# Patient Record
Sex: Male | Born: 1955 | Race: White | Hispanic: No | Marital: Married | Smoking: Current every day smoker
Health system: Southern US, Community
[De-identification: ages and names within clinical notes are randomized; demographics above are authoritative.]

## PROBLEM LIST (undated history)

## (undated) DIAGNOSIS — M1990 Unspecified osteoarthritis, unspecified site: Secondary | ICD-10-CM

## (undated) DIAGNOSIS — M4800 Spinal stenosis, site unspecified: Secondary | ICD-10-CM

## (undated) DIAGNOSIS — K759 Inflammatory liver disease, unspecified: Secondary | ICD-10-CM

## (undated) DIAGNOSIS — I639 Cerebral infarction, unspecified: Secondary | ICD-10-CM

## (undated) DIAGNOSIS — I10 Essential (primary) hypertension: Secondary | ICD-10-CM

## (undated) DIAGNOSIS — C229 Malignant neoplasm of liver, not specified as primary or secondary: Secondary | ICD-10-CM

## (undated) HISTORY — PX: INGUINAL HERNIA REPAIR: SUR1180

## (undated) HISTORY — PX: POSTERIOR CERVICAL VERTEBRAE EXCISION: SUR544

## (undated) HISTORY — DX: Cerebral infarction, unspecified: I63.9

---

## 2015-01-18 ENCOUNTER — Other Ambulatory Visit: Payer: Self-pay | Admitting: Gastroenterology

## 2015-01-18 DIAGNOSIS — R1084 Generalized abdominal pain: Secondary | ICD-10-CM

## 2015-01-23 ENCOUNTER — Ambulatory Visit
Admission: RE | Admit: 2015-01-23 | Discharge: 2015-01-23 | Disposition: A | Discharge status: Home / Self Care | Payer: BLUE CROSS/BLUE SHIELD | Source: Ambulatory Visit | Attending: Gastroenterology | Admitting: Gastroenterology

## 2015-01-23 DIAGNOSIS — I700 Atherosclerosis of aorta: Secondary | ICD-10-CM | POA: Diagnosis not present

## 2015-01-23 DIAGNOSIS — R1084 Generalized abdominal pain: Secondary | ICD-10-CM

## 2015-01-23 DIAGNOSIS — B182 Chronic viral hepatitis C: Secondary | ICD-10-CM | POA: Insufficient documentation

## 2015-02-20 ENCOUNTER — Encounter: Payer: Self-pay | Admitting: *Deleted

## 2015-02-21 ENCOUNTER — Ambulatory Visit: Payer: BLUE CROSS/BLUE SHIELD | Admitting: Anesthesiology

## 2015-02-21 ENCOUNTER — Ambulatory Visit
Admission: RE | Admit: 2015-02-21 | Discharge: 2015-02-21 | Disposition: A | Discharge status: Home / Self Care | Payer: BLUE CROSS/BLUE SHIELD | Source: Ambulatory Visit | Attending: Gastroenterology | Admitting: Gastroenterology

## 2015-02-21 ENCOUNTER — Encounter: Payer: Self-pay | Admitting: *Deleted

## 2015-02-21 ENCOUNTER — Encounter
Admission: RE | Disposition: A | Discharge status: Home / Self Care | Payer: Self-pay | Source: Ambulatory Visit | Attending: Gastroenterology

## 2015-02-21 DIAGNOSIS — Z87891 Personal history of nicotine dependence: Secondary | ICD-10-CM | POA: Insufficient documentation

## 2015-02-21 DIAGNOSIS — M1990 Unspecified osteoarthritis, unspecified site: Secondary | ICD-10-CM | POA: Insufficient documentation

## 2015-02-21 DIAGNOSIS — B182 Chronic viral hepatitis C: Secondary | ICD-10-CM | POA: Insufficient documentation

## 2015-02-21 DIAGNOSIS — Z1211 Encounter for screening for malignant neoplasm of colon: Secondary | ICD-10-CM | POA: Diagnosis present

## 2015-02-21 DIAGNOSIS — M4800 Spinal stenosis, site unspecified: Secondary | ICD-10-CM | POA: Diagnosis not present

## 2015-02-21 DIAGNOSIS — K5730 Diverticulosis of large intestine without perforation or abscess without bleeding: Secondary | ICD-10-CM | POA: Insufficient documentation

## 2015-02-21 DIAGNOSIS — K219 Gastro-esophageal reflux disease without esophagitis: Secondary | ICD-10-CM | POA: Insufficient documentation

## 2015-02-21 DIAGNOSIS — I10 Essential (primary) hypertension: Secondary | ICD-10-CM | POA: Insufficient documentation

## 2015-02-21 DIAGNOSIS — Z79899 Other long term (current) drug therapy: Secondary | ICD-10-CM | POA: Insufficient documentation

## 2015-02-21 DIAGNOSIS — K648 Other hemorrhoids: Secondary | ICD-10-CM | POA: Insufficient documentation

## 2015-02-21 HISTORY — DX: Essential (primary) hypertension: I10

## 2015-02-21 HISTORY — DX: Inflammatory liver disease, unspecified: K75.9

## 2015-02-21 HISTORY — PX: COLONOSCOPY WITH PROPOFOL: SHX5780

## 2015-02-21 HISTORY — DX: Spinal stenosis, site unspecified: M48.00

## 2015-02-21 HISTORY — DX: Unspecified osteoarthritis, unspecified site: M19.90

## 2015-02-21 LAB — PLATELET COUNT: PLATELETS: 220 10*3/uL (ref 150–440)

## 2015-02-21 LAB — PROTIME-INR
INR: 1
PROTHROMBIN TIME: 13.4 s (ref 11.4–15.0)

## 2015-02-21 SURGERY — COLONOSCOPY WITH PROPOFOL
Anesthesia: General

## 2015-02-21 MED ORDER — SODIUM CHLORIDE 0.9 % IV SOLN: INTRAVENOUS | Status: DC

## 2015-02-21 MED ORDER — PROPOFOL 500 MG/50ML IV EMUL
INTRAVENOUS | Status: DC | PRN
  Administered 2015-02-21: 120 ug/kg/min via INTRAVENOUS

## 2015-02-21 MED ORDER — EPHEDRINE SULFATE 50 MG/ML IJ SOLN
INTRAMUSCULAR | Status: DC | PRN
  Administered 2015-02-21 (×2): 5 mg via INTRAVENOUS

## 2015-02-21 MED ORDER — MIDAZOLAM HCL 2 MG/2ML IJ SOLN
INTRAMUSCULAR | Status: DC | PRN
  Administered 2015-02-21: 1 mg via INTRAVENOUS

## 2015-02-21 MED ORDER — FENTANYL CITRATE (PF) 100 MCG/2ML IJ SOLN
INTRAMUSCULAR | Status: DC | PRN
  Administered 2015-02-21: 50 ug via INTRAVENOUS

## 2015-02-21 MED ORDER — SODIUM CHLORIDE 0.9 % IV SOLN
INTRAVENOUS | Status: DC
Start: 2015-02-21 — End: 2015-02-21
  Administered 2015-02-21: 14:00:00 via INTRAVENOUS

## 2015-02-21 NOTE — Anesthesia Postprocedure Evaluation (Signed)
  Anesthesia Post-op Note  Patient: Chris Mcdonald  Procedure(s) Performed: Procedure(s): COLONOSCOPY WITH PROPOFOL (N/A)  Anesthesia type:General  Patient location: PACU  Post pain: Pain level controlled  Post assessment: Post-op Vital signs reviewed, Patient's Cardiovascular Status Stable, Respiratory Function Stable, Patent Airway and No signs of Nausea or vomiting  Post vital signs: Reviewed and stable  Last Vitals:  Filed Vitals:   02/21/15 1600  BP: 125/92  Pulse: 90  Temp:   Resp: 16    Level of consciousness: awake, alert  and patient cooperative  Complications: No apparent anesthesia complications

## 2015-02-21 NOTE — Op Note (Signed)
[facility] Patient Name: Chris Mcdonald Procedure Date: 02/21/2015 2:55 PM MRN: [id number] Account #: [id number] Date of Birth: [birth year] Admit Type: Outpatient Age: 59 Room: [facility] ENDO ROOM 3 Gender: Male Note Status: Finalized Procedure:         Colonoscopy Indications:       Screening for colorectal malignant neoplasm, This is the                     patient's first colonoscopy Providers:         Lollie Sails, MD Referring MD:      Dr Juanda Crumble drew clinic, MD (Referring MD) Medicines:         Monitored Anesthesia Care Complications:     No immediate complications. Procedure:         Pre-Anesthesia Assessment:                    - ASA Grade Assessment: III - A patient with severe                     systemic disease.                    After obtaining informed consent, the colonoscope was                     passed under direct vision. Throughout the procedure, the                     patient's blood pressure, pulse, and oxygen saturations                     were monitored continuously. The Colonoscope was                     introduced through the anus and advanced to the the cecum,                     identified by appendiceal orifice and ileocecal valve. The                     colonoscopy was performed without difficulty. The patient                     tolerated the procedure well. The quality of the bowel                     preparation was fair. Findings:      A few small-mouthed diverticula were found in the sigmoid colon.      The digital rectal exam was normal.      Non-bleeding internal hemorrhoids were found during anoscopy. The       hemorrhoids were small. Impression:        - Diverticulosis in the sigmoid colon.                    - Non-bleeding internal hemorrhoids.                    - No specimens collected. Recommendation:    - Repeat colonoscopy in 10 years for screening purposes. Procedure Code(s): ---  Professional ---                    [phone], Colonoscopy, flexible; diagnostic, including  collection of specimen(s) by brushing or washing, when                     performed (separate procedure) Diagnosis Code(s): --- Professional ---                    V76.51, Special screening for malignant neoplasms of colon                    455.0, Internal hemorrhoids without mention of complication                    562.10, Diverticulosis of colon (without mention of                     hemorrhage) CPT copyright 2014 American Medical Association. All rights reserved. The codes documented in this report are preliminary and upon coder review may  be revised to meet current compliance requirements. Lollie Sails, MD 02/21/2015 3:31:00 PM This report has been signed electronically. Number of Addenda: 0 Note Initiated On: 02/21/2015 2:55 PM Scope Withdrawal Time: 0 hours 6 minutes 26 seconds  Total Procedure Duration: 0 hours 17 minutes 56 seconds       [facility]

## 2015-02-21 NOTE — Transfer of Care (Signed)
Immediate Anesthesia Transfer of Care Note  Patient: Chris Mcdonald  Procedure(s) Performed: Procedure(s): COLONOSCOPY WITH PROPOFOL (N/A)  Patient Location: PACU and Endoscopy Unit  Anesthesia Type:General  Level of Consciousness: oriented and responds to stimulation  Airway & Oxygen Therapy: Patient Spontanous Breathing and Patient connected to nasal cannula oxygen  Post-op Assessment: Report given to RN and Post -op Vital signs reviewed and stable  Post vital signs: Reviewed and stable  Last Vitals:  Filed Vitals:   02/21/15 1533  BP: 92/60  Pulse: 96  Temp: 36.2 C  Resp: 22    Complications: No apparent anesthesia complications

## 2015-02-21 NOTE — Progress Notes (Signed)
Blood drawn for labs by lab tech

## 2015-02-21 NOTE — H&P (Signed)
Outpatient short stay form Pre-procedure 02/21/2015 2:48 PM Lollie Sails MD  Primary Physician: Dr. Clide Deutscher  Reason for visit:  Screening colonoscopy  History of present illness:  Patient is a 59 year old male who has been referred for evaluation initially chronic hepatitis C however he also has never had a colonoscopy for screening purposes in the past. He tolerated his prep well. He denies use of any aspirin or attending products.    Current facility-administered medications:  .  0.9 %  sodium chloride infusion, , Intravenous, Continuous, Lollie Sails, MD, Last Rate: 20 mL/hr at 02/21/15 1406 .  0.9 %  sodium chloride infusion, , Intravenous, Continuous, Lollie Sails, MD  Prescriptions prior to admission  Medication Sig Dispense Refill Last Dose  . amitriptyline (ELAVIL) 25 MG tablet Take 25 mg by mouth at bedtime.   02/20/2015 at Unknown time  . diclofenac (VOLTAREN) 75 MG EC tablet Take 75 mg by mouth 2 (two) times daily.   02/20/2015 at Unknown time  . docusate sodium (COLACE) 100 MG capsule Take 100 mg by mouth 2 (two) times daily.   Past Week at Unknown time  . hydrochlorothiazide (HYDRODIURIL) 12.5 MG tablet Take 12.5 mg by mouth daily.   Past Week at Unknown time  . ondansetron (ZOFRAN-ODT) 4 MG disintegrating tablet Take 4 mg by mouth every 8 (eight) hours as needed for nausea or vomiting.   Past Month at Unknown time  . pregabalin (LYRICA) 25 MG capsule Take 25 mg by mouth 2 (two) times daily.   02/20/2015 at Unknown time     No Known Allergies   Past Medical History  Diagnosis Date  . Arthritis   . Hepatitis   . Hypertension   . Spinal stenosis     Review of systems:      Physical Exam    Heart and lungs: Regular rate and rhythm without rub or gallop, lungs are bilaterally clear    HEENT: Normocephalic atraumatic eyes are anicteric    Other:     Pertinant exam for procedure: Soft nontender nondistended bowel sounds positive  normoactive    Planned proceedures: Colonoscopy and indicated procedures. I have discussed the risks benefits and complications of procedures to include not limited to bleeding, infection, perforation and the risk of sedation and the patient wishes to proceed.    Lollie Sails, MD Gastroenterology 02/21/2015  2:48 PM

## 2015-02-21 NOTE — Anesthesia Preprocedure Evaluation (Signed)
Anesthesia Evaluation  Patient identified by MRN, date of birth, ID band Patient awake    Reviewed: Allergy & Precautions, H&P , NPO status , Patient's Chart, lab work & pertinent test results, reviewed documented beta blocker date and time   History of Anesthesia Complications Negative for: history of anesthetic complications  Airway Mallampati: I  TM Distance: >3 FB Neck ROM: full    Dental no notable dental hx. (+) Edentulous Upper, Edentulous Lower   Pulmonary neg shortness of breath, neg sleep apnea, neg COPD, neg recent URI, former smoker,    Pulmonary exam normal breath sounds clear to auscultation       Cardiovascular Exercise Tolerance: Good hypertension, (-) angina(-) CAD, (-) Past MI, (-) Cardiac Stents and (-) CABG Normal cardiovascular exam(-) dysrhythmias (-) Valvular Problems/Murmurs Rhythm:regular Rate:Normal     Neuro/Psych negative psych ROS   GI/Hepatic GERD  Poorly Controlled,(+) Hepatitis -  Endo/Other  negative endocrine ROS  Renal/GU negative Renal ROS  negative genitourinary   Musculoskeletal   Abdominal   Peds  Hematology negative hematology ROS (+)   Anesthesia Other Findings Past Medical History:   Arthritis                                                    Hepatitis                                                    Hypertension                                                 Spinal stenosis                                              Reproductive/Obstetrics negative OB ROS                             Anesthesia Physical Anesthesia Plan  ASA: III  Anesthesia Plan: General   Post-op Pain Management:    Induction:   Airway Management Planned:   Additional Equipment:   Intra-op Plan:   Post-operative Plan:   Informed Consent: I have reviewed the patients History and Physical, chart, labs and discussed the procedure including the risks, benefits  and alternatives for the proposed anesthesia with the patient or authorized representative who has indicated his/her understanding and acceptance.   Dental Advisory Given  Plan Discussed with: Anesthesiologist, CRNA and Surgeon  Anesthesia Plan Comments:         Anesthesia Quick Evaluation

## 2015-02-22 ENCOUNTER — Encounter: Payer: Self-pay | Admitting: Gastroenterology

## 2015-02-22 ENCOUNTER — Other Ambulatory Visit: Payer: Self-pay | Admitting: Gastroenterology

## 2015-02-22 DIAGNOSIS — R945 Abnormal results of liver function studies: Secondary | ICD-10-CM

## 2015-02-22 DIAGNOSIS — K7460 Unspecified cirrhosis of liver: Secondary | ICD-10-CM

## 2015-02-22 DIAGNOSIS — R7989 Other specified abnormal findings of blood chemistry: Secondary | ICD-10-CM

## 2015-03-01 ENCOUNTER — Other Ambulatory Visit: Payer: Self-pay | Admitting: Radiology

## 2015-03-02 ENCOUNTER — Ambulatory Visit
Admission: RE | Admit: 2015-03-02 | Discharge: 2015-03-02 | Disposition: A | Discharge status: Home / Self Care | Payer: BLUE CROSS/BLUE SHIELD | Source: Ambulatory Visit | Attending: Gastroenterology | Admitting: Gastroenterology

## 2015-03-02 DIAGNOSIS — I10 Essential (primary) hypertension: Secondary | ICD-10-CM | POA: Insufficient documentation

## 2015-03-02 DIAGNOSIS — K7460 Unspecified cirrhosis of liver: Secondary | ICD-10-CM

## 2015-03-02 DIAGNOSIS — Z87891 Personal history of nicotine dependence: Secondary | ICD-10-CM | POA: Insufficient documentation

## 2015-03-02 DIAGNOSIS — B1920 Unspecified viral hepatitis C without hepatic coma: Secondary | ICD-10-CM | POA: Diagnosis not present

## 2015-03-02 DIAGNOSIS — R945 Abnormal results of liver function studies: Secondary | ICD-10-CM

## 2015-03-02 DIAGNOSIS — R748 Abnormal levels of other serum enzymes: Secondary | ICD-10-CM | POA: Insufficient documentation

## 2015-03-02 DIAGNOSIS — R7989 Other specified abnormal findings of blood chemistry: Secondary | ICD-10-CM

## 2015-03-02 MED ORDER — SODIUM CHLORIDE 0.9 % IJ SOLN
INTRAMUSCULAR | Status: AC
  Filled 2015-03-02: qty 3

## 2015-03-02 MED ORDER — FENTANYL CITRATE (PF) 100 MCG/2ML IJ SOLN
INTRAMUSCULAR | Status: AC | PRN
  Administered 2015-03-02: 50 ug via INTRAVENOUS

## 2015-03-02 MED ORDER — MIDAZOLAM HCL 5 MG/5ML IJ SOLN
INTRAMUSCULAR | Status: AC
  Filled 2015-03-02: qty 5

## 2015-03-02 MED ORDER — SODIUM CHLORIDE 0.9 % IV SOLN
INTRAVENOUS | Status: DC
  Administered 2015-03-02: 08:00:00 via INTRAVENOUS

## 2015-03-02 MED ORDER — MIDAZOLAM HCL 2 MG/2ML IJ SOLN
INTRAMUSCULAR | Status: AC | PRN
  Administered 2015-03-02: 1 mg via INTRAVENOUS

## 2015-03-02 MED ORDER — HYDROCODONE-ACETAMINOPHEN 5-325 MG PO TABS
1.0000 | ORAL_TABLET | ORAL | Status: DC | PRN
  Filled 2015-03-02: qty 2

## 2015-03-02 MED ORDER — FENTANYL CITRATE (PF) 100 MCG/2ML IJ SOLN
INTRAMUSCULAR | Status: AC
  Filled 2015-03-02: qty 4

## 2015-03-02 NOTE — H&P (Signed)
Chief Complaint: Hepatitis C and elevated liver enzymes.  Referring Physician(s): Skulskie,Martin U  History of Present Illness: Chris Mcdonald is a 59 y.o. male here for liver biopsy for further workup of liver disease.  Known Hepatitis C and abnormal LFT's.  Past Medical History  Diagnosis Date  . Arthritis   . Hepatitis   . Hypertension   . Spinal stenosis     Past Surgical History  Procedure Laterality Date  . Posterior cervical vertebrae excision    . Colonoscopy with propofol N/A 02/21/2015    Procedure: COLONOSCOPY WITH PROPOFOL;  Surgeon: Lollie Sails, MD;  Location: [facility] ENDOSCOPY;  Service: Endoscopy;  Laterality: N/A;    Allergies: Review of patient's allergies indicates no known allergies.  Medications: Prior to Admission medications   Medication Sig Start Date End Date Taking? Authorizing Provider  acetaminophen (TYLENOL) 500 MG tablet Take 500 mg by mouth every 8 (eight) hours as needed for moderate pain.   Yes Historical Provider, MD  amitriptyline (ELAVIL) 25 MG tablet Take 25 mg by mouth at bedtime.   Yes Historical Provider, MD  diclofenac (VOLTAREN) 75 MG EC tablet Take 75 mg by mouth 2 (two) times daily.   Yes Historical Provider, MD  docusate sodium (COLACE) 100 MG capsule Take 100 mg by mouth 2 (two) times daily.   Yes Historical Provider, MD  hydrochlorothiazide (HYDRODIURIL) 12.5 MG tablet Take 12.5 mg by mouth daily.   Yes Historical Provider, MD  omeprazole (PRILOSEC) 20 MG capsule Take 20 mg by mouth daily.   Yes Historical Provider, MD  ondansetron (ZOFRAN-ODT) 4 MG disintegrating tablet Take 4 mg by mouth every 8 (eight) hours as needed for nausea or vomiting.   Yes Historical Provider, MD  pregabalin (LYRICA) 25 MG capsule Take 25 mg by mouth 2 (two) times daily.   Yes Historical Provider, MD     History reviewed. No pertinent family history.  Social History   Social History  . Marital Status: Married    Spouse Name: N/A  .  Number of Children: N/A  . Years of Education: N/A   Social History Main Topics  . Smoking status: Former Smoker -- 1.50 packs/day    Types: Cigarettes    Quit date: 10/30/2014  . Smokeless tobacco: Never Used  . Alcohol Use: No  . Drug Use: No  . Sexual Activity: Not Asked   Other Topics Concern  . None   Social History Narrative   Review of Systems: A 12 point ROS discussed and pertinent positives are indicated in the HPI above.  All other systems are negative.  Review of Systems  Constitutional: Negative.   Respiratory: Negative.   Cardiovascular: Negative.   Gastrointestinal: Negative.   Genitourinary: Negative.   Musculoskeletal: Negative.   Neurological: Negative.     Vital Signs: BP 70/53 mmHg  Pulse 64  Temp(Src) 97.6 F (36.4 C) (Oral)  Resp 12  SpO2 95%  Physical Exam  Constitutional: No distress.  Cardiovascular: Normal rate, regular rhythm and normal heart sounds.  Exam reveals no gallop and no friction rub.   No murmur heard. Pulmonary/Chest: Effort normal and breath sounds normal. No respiratory distress. He has no wheezes. He has no rales.  Abdominal: Soft. Bowel sounds are normal. He exhibits no distension and no mass. There is no tenderness. There is no rebound and no guarding.  Skin: He is not diaphoretic.  Vitals reviewed.   Mallampati Score:  MD Evaluation Airway: WNL Heart: WNL Abdomen: WNL Chest/  Lungs: WNL ASA  Classification: 2 Mallampati/Airway Score: One  Imaging: No results found.  Labs:  CBC:  Recent Labs  02/21/15 1351  PLT 220    COAGS:  Recent Labs  02/21/15 1351  INR 1.00    BMP: No results for input(s): NA, K, CL, CO2, GLUCOSE, BUN, CALCIUM, CREATININE, GFRNONAA, GFRAA in the last 8760 hours.  Invalid input(s): CMP  LIVER FUNCTION TESTS: No results for input(s): BILITOT, AST, ALT, ALKPHOS, PROT, ALBUMIN in the last 8760 hours.  TUMOR MARKERS: No results for input(s): AFPTM, CEA, CA199, CHROMGRNA in  the last 8760 hours.  Assessment and Plan:  For random core biopsy of the liver under ultrasound guidance.  Risks and benefits discussed with the patient including, but not limited to bleeding, infection, damage to adjacent structures or low yield requiring additional tests. All of the patient's questions were answered, patient is agreeable to proceed. Consent signed and in chart.   SignedAletta Edouard T 03/02/2015, 9:25 AM

## 2015-03-02 NOTE — Procedures (Signed)
Interventional Radiology Procedure Note  Procedure:  Ultrasound guided liver biopsy  Complications:  None  Estimated Blood Loss:  < 10 mL  17 G needle advanced into right lobe of liver.  18 G core biopsy x 2. Good samples.   Plan:  2 hour recovery.  Venetia Night. Kathlene Cote, M.D Pager:  [phone]

## 2015-03-02 NOTE — Discharge Instructions (Signed)
Liver Biopsy The liver is a large organ in the upper right-hand side of your abdomen. A liver biopsy is a procedure in which a tissue sample is taken from the liver and examined under a microscope. The procedure is done to confirm a suspected problem. There are three types of liver biopsies:  Percutaneous. In this type, an incision is made in your abdomen. The sample is removed through the incision with a needle.  Laparoscopic. In this type, several incisions are made in the abdomen. A tiny camera is passed through one of the incisions to help guide the health care provider. The sample is removed through the other incision or incisions.  Transjugular. In this type, an incision is made in the neck. A tube is passed through the incision to the liver. The sample is removed through the tube with a needle. LET [facility] CARE PROVIDER KNOW ABOUT:  Any allergies you have.  All medicines you are taking, including vitamins, herbs, eye drops, creams, and over-the-counter medicines.  Previous problems you or members of your family have had with the use of anesthetics.  Any blood disorders you have.  Previous surgeries you have had.  Medical conditions you have.  Possibility of pregnancy, if this applies. RISKS AND COMPLICATIONS Generally, this is a safe procedure. However, problems can occur and include:  Bleeding.  Infection.  Bruising.  Collapsed lung.  Leak of digestive juices (bile) from the liver or gallbladder.  Problems with heart rhythm.  Pain at the biopsy site or in the right shoulder.  Low blood pressure (hypotension).  Injury to nearby organs or tissues. BEFORE THE PROCEDURE  Your health care provider may do some blood or urine tests. These will help your health care provider learn how well your kidneys and liver are working and how well your blood clots.  Ask your health care provider if you will be able to go home the day of the procedure. Arrange for someone to  take you home and stay with you for at least 24 hours.  Do not eat or drink anything after midnight on the night before the procedure or as directed by your health care provider.  Ask your health care provider about:  Changing or stopping your regular medicines. This is especially important if you are taking diabetes medicines or blood thinners.  Taking medicines such as aspirin and ibuprofen. These medicines can thin your blood. Do not take these medicines before your procedure if your health care provider asks you not to. PROCEDURE Regardless of the type of biopsy that will be done, you will have an IV line placed. Through this line, you will receive fluids and medicine to relax you. If you will be having a laparoscopic biopsy, you may also receive medicine through this line to make you sleep during the procedure (general anesthetic). Percutaneous Liver Biopsy  You will positioned on your back, with your right hand over your head.  A health care provider will locate your liver by tapping and pressing on the right side of your abdomen or with the help of an ultrasound machine or CT scan.  An area at the bottom of your last right rib will be numbed.  An incision will be made in the numbed area.  The biopsy needle will be inserted into the incision.  Several samples of liver tissue will be taken with the biopsy needle. You will be asked to hold your breath as each sample is taken. Laparoscopic Liver Biopsy  You will be  positioned on your back. °· Several small incisions will be made in your abdomen. °· Your doctor will pass a tiny camera through one incision. The camera will allow the liver to be viewed on a TV monitor in the operating room. °· Tools will be passed through the other incision or incisions. These tools will be used to remove samples of liver tissue. °Transjugular Liver Biopsy °· You will be positioned on your back on an X-ray table, with your head turned to your left. °· An  area on your neck just over your jugular vein will be numbed. °· An incision will be made in the numbed area. °· A tiny tube will be inserted through the incision. It will be pushed through the jugular vein to a blood vessel in the liver called the hepatic vein. °· Dye will be inserted through the tube, and X-rays will be taken. The dye will make the blood vessels in the liver light up on the X-rays. °· The biopsy needle will be pushed through the tube until it reaches the liver. °· Samples of liver tissue will be taken with the biopsy needle. °· The needle and the tube will be removed. °After the samples are obtained, the incision or incisions will be closed. °AFTER THE PROCEDURE °· You will be taken to a recovery area. °· You may have to lie on your right side for 1-2 hours. This will prevent bleeding from the biopsy site. °· Your progress will be watched. Your blood pressure, pulse, and the biopsy site will be checked often. °· You may have some pain or feel sick. If this happens, tell your health care provider. °· As you begin to feel better, you will be offered ice and beverages. °· You may be allowed to go home when the medicines have worn off and you can walk, drink, eat, and use the bathroom. °  °This information is not intended to replace advice given to you by your health care provider. Make sure you discuss any questions you have with your health care provider. °  °Document Released: 06/29/2003 Document Revised: 04/29/2014 Document Reviewed: 06/04/2013 °Elsevier Interactive Patient Education ©2016 [employer] ° °

## 2015-03-03 LAB — SURGICAL PATHOLOGY

## 2015-07-03 ENCOUNTER — Other Ambulatory Visit: Payer: Self-pay | Admitting: Gastroenterology

## 2015-07-03 DIAGNOSIS — B182 Chronic viral hepatitis C: Secondary | ICD-10-CM

## 2015-07-06 ENCOUNTER — Ambulatory Visit
Admission: RE | Admit: 2015-07-06 | Discharge: 2015-07-06 | Disposition: A | Discharge status: Home / Self Care | Payer: BLUE CROSS/BLUE SHIELD | Source: Ambulatory Visit | Attending: Gastroenterology | Admitting: Gastroenterology

## 2015-07-06 DIAGNOSIS — B182 Chronic viral hepatitis C: Secondary | ICD-10-CM

## 2016-01-01 ENCOUNTER — Other Ambulatory Visit: Payer: Self-pay | Admitting: Gastroenterology

## 2016-01-01 DIAGNOSIS — Z8619 Personal history of other infectious and parasitic diseases: Secondary | ICD-10-CM

## 2016-01-08 ENCOUNTER — Ambulatory Visit
Admission: RE | Admit: 2016-01-08 | Discharge: 2016-01-08 | Disposition: A | Discharge status: Home / Self Care | Payer: BLUE CROSS/BLUE SHIELD | Source: Ambulatory Visit | Attending: Gastroenterology | Admitting: Gastroenterology

## 2016-01-08 DIAGNOSIS — R932 Abnormal findings on diagnostic imaging of liver and biliary tract: Secondary | ICD-10-CM | POA: Insufficient documentation

## 2016-01-08 DIAGNOSIS — K740 Hepatic fibrosis: Secondary | ICD-10-CM | POA: Diagnosis not present

## 2016-01-08 DIAGNOSIS — Z8619 Personal history of other infectious and parasitic diseases: Secondary | ICD-10-CM | POA: Insufficient documentation

## 2017-05-27 ENCOUNTER — Telehealth: Payer: Self-pay | Admitting: *Deleted

## 2017-05-27 DIAGNOSIS — Z87891 Personal history of nicotine dependence: Secondary | ICD-10-CM

## 2017-05-27 DIAGNOSIS — Z122 Encounter for screening for malignant neoplasm of respiratory organs: Secondary | ICD-10-CM

## 2017-05-27 NOTE — Telephone Encounter (Signed)
Received referral for initial lung cancer screening scan. Contacted patient and obtained smoking history,(current, 70.5 pack year) as well as answering questions related to screening process. Patient denies signs of lung cancer such as weight loss or hemoptysis. Patient denies comorbidity that would prevent curative treatment if lung cancer were found. Patient is scheduled for shared decision making visit and CT scan on 06/17/17.

## 2017-06-17 ENCOUNTER — Ambulatory Visit
Admission: RE | Admit: 2017-06-17 | Discharge: 2017-06-17 | Disposition: A | Discharge status: Home / Self Care | Payer: BLUE CROSS/BLUE SHIELD | Source: Ambulatory Visit | Attending: Oncology | Admitting: Oncology

## 2017-06-17 ENCOUNTER — Inpatient Hospital Stay: Payer: BLUE CROSS/BLUE SHIELD | Attending: Oncology | Admitting: Nurse Practitioner

## 2017-06-17 ENCOUNTER — Encounter: Payer: Self-pay | Admitting: Nurse Practitioner

## 2017-06-17 DIAGNOSIS — Z87891 Personal history of nicotine dependence: Secondary | ICD-10-CM

## 2017-06-17 DIAGNOSIS — J432 Centrilobular emphysema: Secondary | ICD-10-CM | POA: Insufficient documentation

## 2017-06-17 DIAGNOSIS — Z122 Encounter for screening for malignant neoplasm of respiratory organs: Secondary | ICD-10-CM

## 2017-06-17 DIAGNOSIS — J438 Other emphysema: Secondary | ICD-10-CM | POA: Insufficient documentation

## 2017-06-17 DIAGNOSIS — R918 Other nonspecific abnormal finding of lung field: Secondary | ICD-10-CM | POA: Diagnosis not present

## 2017-06-17 NOTE — Progress Notes (Signed)
In accordance with CMS guidelines, patient has met eligibility criteria including age, absence of signs or symptoms of lung cancer.  Social History   Tobacco Use  . Smoking status: Former Smoker    Packs/day: 1.50    Years: 47.00    Pack years: 70.50    Types: Cigarettes    Last attempt to quit: 10/30/2014    Years since quitting: 2.6  . Smokeless tobacco: Never Used  Substance Use Topics  . Alcohol use: No  . Drug use: No     A shared decision-making session was conducted prior to the performance of CT scan. This includes one or more decision aids, includes benefits and harms of screening, follow-up diagnostic testing, over-diagnosis, false positive rate, and total radiation exposure.  Counseling on the importance of adherence to annual lung cancer LDCT screening, impact of co-morbidities, and ability or willingness to undergo diagnosis and treatment is imperative for compliance of the program.  Counseling on the importance of continued smoking cessation for former smokers; the importance of smoking cessation for current smokers, and information about tobacco cessation interventions have been given to patient including [place] and 1800 quit [place] programs.  Written order for lung cancer screening with LDCT has been given to the patient and any and all questions have been answered to the best of my abilities.   Yearly follow up will be coordinated by Burgess Estelle, Thoracic Navigator.  Beckey Rutter, DNP, AGNP-C [place] [facility] [phone] (office) 06/17/17 3:13 PM

## 2017-06-20 ENCOUNTER — Encounter: Payer: Self-pay | Admitting: *Deleted

## 2018-06-12 ENCOUNTER — Telehealth: Payer: Self-pay

## 2018-06-12 ENCOUNTER — Telehealth: Payer: Self-pay | Admitting: *Deleted

## 2018-06-12 DIAGNOSIS — Z122 Encounter for screening for malignant neoplasm of respiratory organs: Secondary | ICD-10-CM

## 2018-06-12 NOTE — Telephone Encounter (Signed)
Call pt regarding lung screening. Pt is a current smoker, smoking about 1/2 pack per day. Pt would like scan in the afternoon, he also denies any new health issues at this time.

## 2018-06-12 NOTE — Telephone Encounter (Signed)
Patient has been notified that the annual lung cancer screening low dose CT scan is due currently or will be in the near future.  Confirmed that the patient is within the age range of 43-80, and asymptomatic, and currently exhibits no signs or symptoms of lung cancer.  Patient denies illness that would prevent curative treatment for lung cancer if found.  Verified smoking history, current smoker with 70.5pkyr hx .  The shared decision making visit was completed on 06-17-17.  Patient is agreeable for the CT scan to be scheduled.  Will call patient back with date and time of appointment.

## 2018-06-15 ENCOUNTER — Telehealth: Payer: Self-pay | Admitting: *Deleted

## 2018-06-15 NOTE — Telephone Encounter (Signed)
Called pt to inform him of his appt for ldct screening on Thursday 06/18/2018 here @ [place] @ 4:30pm, voiced understanding.

## 2018-06-17 ENCOUNTER — Encounter: Payer: Self-pay | Admitting: *Deleted

## 2018-06-18 ENCOUNTER — Ambulatory Visit
Admission: RE | Admit: 2018-06-18 | Discharge: 2018-06-18 | Disposition: A | Discharge status: Home / Self Care | Payer: BLUE CROSS/BLUE SHIELD | Source: Ambulatory Visit | Attending: Nurse Practitioner | Admitting: Nurse Practitioner

## 2018-06-18 DIAGNOSIS — Z122 Encounter for screening for malignant neoplasm of respiratory organs: Secondary | ICD-10-CM | POA: Diagnosis not present

## 2018-06-19 ENCOUNTER — Encounter: Payer: Self-pay | Admitting: *Deleted

## 2018-06-19 ENCOUNTER — Telehealth: Payer: Self-pay | Admitting: *Deleted

## 2018-06-19 NOTE — Telephone Encounter (Signed)
After faxing a report and calling PCP's nurse to notify her of the scan results, Notified patient of LDCT lung cancer screening program results with recommendation for 3 month follow up imaging. Also notified of incidental findings noted below, (especially the hepatic finding that he is expecting further imaging appt. For), and is encouraged to discuss further with PCP who will receive a copy of this note and/or the CT report. Patient verbalizes understanding.   IMPRESSION: 1. Lung-RADS 4A-S, suspicious. New centrilobular nodularity in both lungs, more likely inflammatory. Follow up low-dose chest CT without contrast in 3 months (please use the following order, "CT CHEST LCS NODULE FOLLOW-UP W/O CM") is recommended. 2. The "S" modifier above refers to potentially clinically significant non lung cancer related findings. Specifically, large poorly marginated geographic region of low attenuation in the superior right liver, new since 06/17/2017 CT. MRI abdomen without and with IV contrast is recommended for further characterization to evaluate for possible liver mass. 3. Three-vessel coronary atherosclerosis.  Aortic Atherosclerosis (ICD10-I70.0) and Emphysema (ICD10-J43.9).

## 2018-06-19 NOTE — Telephone Encounter (Signed)
error 

## 2018-06-30 ENCOUNTER — Other Ambulatory Visit: Payer: Self-pay | Admitting: Family Medicine

## 2018-06-30 DIAGNOSIS — K769 Liver disease, unspecified: Secondary | ICD-10-CM

## 2018-07-10 ENCOUNTER — Ambulatory Visit
Admission: RE | Admit: 2018-07-10 | Discharge: 2018-07-10 | Disposition: A | Discharge status: Home / Self Care | Payer: BLUE CROSS/BLUE SHIELD | Source: Ambulatory Visit | Attending: Family Medicine | Admitting: Family Medicine

## 2018-07-10 ENCOUNTER — Other Ambulatory Visit: Payer: Self-pay

## 2018-07-10 DIAGNOSIS — K769 Liver disease, unspecified: Secondary | ICD-10-CM | POA: Diagnosis present

## 2018-07-10 LAB — POCT I-STAT CREATININE: CREATININE: 0.8 mg/dL (ref 0.61–1.24)

## 2018-07-10 MED ORDER — GADOBUTROL 1 MMOL/ML IV SOLN
5.0000 mL | Freq: Once | INTRAVENOUS | Status: AC | PRN
  Administered 2018-07-10: 5 mL via INTRAVENOUS

## 2018-07-15 ENCOUNTER — Telehealth: Payer: Self-pay | Admitting: *Deleted

## 2018-07-15 NOTE — Telephone Encounter (Signed)
Noted MRI results. Message left at PCP office with offer to assist in coordination of a medical oncology or other appt.

## 2018-07-26 DIAGNOSIS — C220 Liver cell carcinoma: Secondary | ICD-10-CM | POA: Insufficient documentation

## 2018-07-26 NOTE — Progress Notes (Signed)
[facility]  [address], [address] [place], [place] [address] Phone: [phone]  Fax: [phone]   Clinic day:  07/27/2018   Referring physician:  Donnie Coffin, MD   Chief Complaint: Chris Mcdonald is a 63 y.o. male with a liver lesion who is referred in consultation by Dr. Tomasa Hose for assessment and management.  HPI:  The patient notes a history of hepatitis C treated at the [facility].  He was treated with Harvoni beginning 04/11/2015.  He tolerated treatment well.  He states that treatment was successful (no detectable virus).  He has not had follow-up since 07/03/2015.  Six month follow-up was scheduled.  He denies any history of hepatitis B or issues with iron metabolism.  He does note a distant history of alcohol use.  He has an extensive history of smoking.  He states that he smoked a 1-1/2 packs a day since the age of 38.  He has approximately 75-pack-year smoking history.  He began the yearly low-dose chest CT lung cancer screening program on 06/17/2017.  Low dose chest CT on 06/18/2018 revealed Lung-RADS 4A-S, suspicious.  There was a new centrilobular nodularity in both lungs, more likely inflammatory. Follow up low-dose chest CT without contrast in 3 months was recommended.  Three was a 8.5 x 5.7 cm poorly marginated geographic region of low attenuation in the superior right liver, new since 06/17/2017.   Abdomen MRI on 07/10/2018 revealed an 8.8 cm heterogeneous infiltrating segment 8.  Findings were consistent with a hepatoma, given the underlying cirrhotic changes involving the liver. This was also invading and obstructing the middle and right portal veins and the middle hepatic vein.  There were scattered abdominal lymph nodes but no definite findings for metastatic disease.  He was seen by Dr. Clide Deutscher on 07/13/2018.  He had lost 13 pounds in 1 year.  CBC revealed a hematocrit 44.7, hemoglobin 15.1, MCV 87, platelets 236,000, white  count 6200 with an ANC of 4200.  Creatinine was 0.85.  Electrolytes were normal.  Albumen was 3.8 with a protein of 7.2.  Bilirubin was 0.3, alkaline phosphatase 225, SGOT 41 and SGPT 31.  Symptomatically, he feels "normal".  He believes that he has lost about 25 to 30 pounds.  He has shortness of breath when walking distances.  He denies any abdominal pain, nausea, vomiting, diarrhea, constipation, melena or hematochezia.  He is up-to-date on his colonoscopy.  He notes severe arthritis in his lower back.  He describes hurting head to toe which is long standing since trauma in 2015 when he fell off steps and hurt his back.  He required spinal surgery with rod placement from C3-C7.   Past Medical History:  Diagnosis Date  . Arthritis   . Hepatitis   . Hypertension   . Spinal stenosis   . Stroke [facility])     Past Surgical History:  Procedure Laterality Date  . COLONOSCOPY WITH PROPOFOL N/A 02/21/2015   Procedure: COLONOSCOPY WITH PROPOFOL;  Surgeon: Lollie Sails, MD;  Location: [facility] ENDOSCOPY;  Service: Endoscopy;  Laterality: N/A;  . INGUINAL HERNIA REPAIR Bilateral   . POSTERIOR CERVICAL VERTEBRAE EXCISION      Family History  Problem Relation Age of Onset  . Cancer Brother   . Heart disease Brother     Social History:  reports that he has been smoking cigarettes. He has a 75.00 pack-year smoking history. He has never used smokeless tobacco. He reports that he does not drink  alcohol or use drugs.  He denies any exposure to radiations or toxins.  He has a 43 year old daughter, 33 year old son, and 50 year old son.  He previously worked Architect.  He lives in [place].  The patient is alone today.    Allergies: No Known Allergies  Current Medications: Current Outpatient Medications  Medication Sig Dispense Refill  . aspirin EC 81 MG tablet Take by mouth.    . hydrochlorothiazide (HYDRODIURIL) 12.5 MG tablet Take 12.5 mg by mouth daily.     No current facility-administered  medications for this visit.     Review of Systems:  GENERAL:  Feels "normal".  No fevers, sweats.  Weight loss of 25-30 pounds (previously weighed 145-150 pounds). PERFORMANCE STATUS (ECOG):  1-2 HEENT:  No visual changes, runny nose, sore throat, mouth sores or tenderness. Lungs:  Shortness of breath when walking distances.  No cough.  No hemoptysis. Cardiac:  No chest pain, palpitations, orthopnea, or PND.  Heart monitor planned (Dr Sherre Lain [facility]). GI:  No nausea, vomiting, diarrhea, constipation, melena or hematochezia.  Colonoscopy UTD. GU:  No urgency, frequency, dysuria, or hematuria. Musculoskeletal:  Severe arthritis in lower back.  Arthritis in thumbs.  No muscle tenderness. Extremities:  No pain or swelling. Skin:  No rashes or skin changes. Neuro:  Headache all of the time secondary to cervical spine issues.  Numbness in legs (old).  h/o left sided CVA (2018).  Right hand numb. No balance or coordination issues. Endocrine:  No diabetes, thyroid issues, hot flashes or night sweats. Psych:  No mood changes, depression or anxiety. Pain:  Hurts "head to toe" (chronic). Review of systems:  All other systems reviewed and found to be negative.  Physical Exam: Blood pressure 131/84, pulse 78, temperature 98.1 F (36.7 C), temperature source Oral, resp. rate 18, height 5\' 7"  (1.702 m), weight 119 lb 13.1 oz (54.3 kg), SpO2 100 %. GENERAL:  Thin gentleman sitting comfortably in the exam room in no acute distress. MENTAL STATUS:  Alert and oriented to person, place and time. HEAD:  Short gray hair.  Thick gray beard.  Temporal wasting.  Normocephalic, atraumatic, face symmetric, no Cushingoid features. EYES:  Blue eyes.  Pupils equal round and reactive to light and accomodation.  No conjunctivitis or scleral icterus. ENT:  Oropharynx clear without lesion.  Tongue normal.  Edentulous.  Mucous membranes moist.  RESPIRATORY:  Clear to auscultation without rales, wheezes or  rhonchi. CARDIOVASCULAR:  Regular rate and rhythm without murmur, rub or gallop. ABDOMEN:  Soft, non-tender, with active bowel sounds, and no splenomegaly.  Medial liver palpable.  No masses. SKIN:  No rashes, ulcers or lesions. EXTREMITIES: No edema, no skin discoloration or tenderness.  No palpable cords. LYMPH NODES: No palpable cervical, supraclavicular, axillary or inguinal adenopathy  NEUROLOGICAL: Unremarkable. PSYCH:  Appropriate.   No visits with results within 3 Day(s) from this visit.  Latest known visit with results is:  Hospital Outpatient Visit on 07/10/2018  Component Date Value Ref Range Status  . Creatinine, Ser 07/10/2018 0.80  0.61 - 1.24 mg/dL Final    Assessment:  Rodderick Holtzer is a 63 y.o. male with a liver mass concerning for hepatocellular carcinoma.  He has a history of hepatitis C (treated 04/11/2015).  Mass was discovered incidentally on low dose chest CT screenig program.  Child-Pugh Class is A (score 5).  Low dose chest CT on 06/18/2018 revealed Lung-RADS 4A-S, suspicious.  There was a new centrilobular nodularity in both  lungs, more likely inflammatory. Follow up low-dose chest CT without contrast in 3 months was recommended.  Three was a 8.5 x 5.7 cm poorly marginated geographic region of low attenuation in the superior right liver, new since 06/17/2017.   Abdomen MRI on 07/10/2018 revealed an 8.8 cm heterogeneous infiltrating segment 8.  Findings were consistent with a hepatoma, given the underlying cirrhotic changes involving the liver. This was also invading and obstructing the middle and right portal veins and the middle hepatic vein.  There were scattered abdominal lymph nodes but no definite findings for metastatic disease.  Symptomatically, he has lost 25-30 pounds.  Exam reveals temporal wasting and a palpable liver.  Plan: 1.   Labs today:  AFP, hepatitis B core antibody, hepatitis B surface antigen, hepatitis C antibody, ferritin, iron studies, PT,  PTT. 2.   Liver mass  Etiology likely due to hepatocellular carcinoma  Lesion appears unresectable based on size and involvement of major vessels.  Discuss presentation at tumor board.  Discuss consideration of arterially directed therapy and/or systemic therapy.  Arterially directed therapy through interventional radiology:    TAE, TACE, DEB-TACE, radioembolization or Y-90 microspheres.   Discuss with interventional radiology.   Systemic therapy:   lenvatinib or sorafenib.  3.   Lung nodularity  Anticipate follow-up chest CT on 09/16/2018. 4.   Tumor board on 07/30/2018. 5.   RTC in 1 week for MD assessment, review of work-up, and discussion regarding direction of therapy.     Lequita Asal, MD, PhD  07/27/2018, 1:54 PM

## 2018-07-27 ENCOUNTER — Encounter: Payer: Self-pay | Admitting: Hematology and Oncology

## 2018-07-27 ENCOUNTER — Inpatient Hospital Stay: Payer: Managed Care, Other (non HMO) | Attending: Hematology and Oncology | Admitting: Hematology and Oncology

## 2018-07-27 ENCOUNTER — Other Ambulatory Visit: Payer: Self-pay

## 2018-07-27 ENCOUNTER — Inpatient Hospital Stay: Payer: Managed Care, Other (non HMO)

## 2018-07-27 VITALS — BP 131/84 | HR 78 | Temp 98.1°F | Resp 18 | Ht 67.0 in | Wt 119.8 lb

## 2018-07-27 DIAGNOSIS — Z79899 Other long term (current) drug therapy: Secondary | ICD-10-CM | POA: Diagnosis not present

## 2018-07-27 DIAGNOSIS — C220 Liver cell carcinoma: Secondary | ICD-10-CM | POA: Diagnosis not present

## 2018-07-27 DIAGNOSIS — I10 Essential (primary) hypertension: Secondary | ICD-10-CM | POA: Insufficient documentation

## 2018-07-27 DIAGNOSIS — R0602 Shortness of breath: Secondary | ICD-10-CM

## 2018-07-27 DIAGNOSIS — Z8673 Personal history of transient ischemic attack (TIA), and cerebral infarction without residual deficits: Secondary | ICD-10-CM | POA: Diagnosis not present

## 2018-07-27 DIAGNOSIS — K769 Liver disease, unspecified: Secondary | ICD-10-CM | POA: Insufficient documentation

## 2018-07-27 DIAGNOSIS — Z7982 Long term (current) use of aspirin: Secondary | ICD-10-CM | POA: Diagnosis not present

## 2018-07-27 DIAGNOSIS — R918 Other nonspecific abnormal finding of lung field: Secondary | ICD-10-CM | POA: Insufficient documentation

## 2018-07-27 DIAGNOSIS — R634 Abnormal weight loss: Secondary | ICD-10-CM

## 2018-07-27 DIAGNOSIS — R160 Hepatomegaly, not elsewhere classified: Secondary | ICD-10-CM

## 2018-07-27 DIAGNOSIS — R911 Solitary pulmonary nodule: Secondary | ICD-10-CM

## 2018-07-27 DIAGNOSIS — M129 Arthropathy, unspecified: Secondary | ICD-10-CM | POA: Insufficient documentation

## 2018-07-27 DIAGNOSIS — B1920 Unspecified viral hepatitis C without hepatic coma: Secondary | ICD-10-CM

## 2018-07-27 DIAGNOSIS — F17210 Nicotine dependence, cigarettes, uncomplicated: Secondary | ICD-10-CM | POA: Diagnosis not present

## 2018-07-27 LAB — IRON AND TIBC
Iron: 68 ug/dL (ref 45–182)
Saturation Ratios: 16 % — ABNORMAL LOW (ref 17.9–39.5)
TIBC: 415 ug/dL (ref 250–450)
UIBC: 347 ug/dL

## 2018-07-27 LAB — APTT: aPTT: 33 seconds (ref 24–36)

## 2018-07-27 LAB — PROTIME-INR
INR: 0.9 (ref 0.8–1.2)
Prothrombin Time: 12 seconds (ref 11.4–15.2)

## 2018-07-27 LAB — FERRITIN: Ferritin: 297 ng/mL (ref 24–336)

## 2018-07-27 NOTE — Progress Notes (Signed)
Patient her as new patient referral from Dr. Clide Deutscher. Per patient, was told he has a mass on liver and chest mass was questionable. Denies any concerns.

## 2018-07-28 LAB — HEPATITIS B SURFACE ANTIGEN: Hepatitis B Surface Ag: NEGATIVE

## 2018-07-28 LAB — HEPATITIS B CORE ANTIBODY, TOTAL: Hep B Core Total Ab: POSITIVE — AB

## 2018-07-30 ENCOUNTER — Other Ambulatory Visit: Payer: Medicare Other

## 2018-07-30 LAB — AFP TUMOR MARKER: AFP, Serum, Tumor Marker: 7329 ng/mL — ABNORMAL HIGH (ref 0.0–8.3)

## 2018-07-30 NOTE — Progress Notes (Signed)
Tumor Board Documentation  Chris Mcdonald was presented by Dr Mike Gip at our Tumor Board on 07/30/2018, which included representatives from medical oncology, radiation oncology, pathology, navigation, radiology, surgical, surgical oncology, internal medicine, genetics, research, palliative care.  Chris Mcdonald currently presents for discussion, as a new patient with history of the following treatments: active survellience.  Additionally, we reviewed previous medical and familial history, history of present illness, and recent lab results along with all available histopathologic and imaging studies. The tumor board considered available treatment options and made the following recommendations:   Radiologist will review  and call Dr Mike Gip   The following procedures/referrals were also placed: No orders of the defined types were placed in this encounter.   Clinical Trial Status: not discussed   Staging used:    National site-specific guidelines   were discussed with respect to the case.  Tumor board is a meeting of clinicians from various specialty areas who evaluate and discuss patients for whom a multidisciplinary approach is being considered. Final determinations in the plan of care are those of the provider(s). The responsibility for follow up of recommendations given during tumor board is that of the provider.   Today's extended care, comprehensive team conference, Chris Mcdonald was not present for the discussion and was not examined.   Multidisciplinary Tumor Board is a multidisciplinary case peer review process.  Decisions discussed in the Multidisciplinary Tumor Board reflect the opinions of the specialists present at the conference without having examined the patient.  Ultimately, treatment and diagnostic decisions rest with the primary provider(s) and the patient.

## 2018-07-31 ENCOUNTER — Telehealth: Payer: Self-pay | Admitting: Hematology and Oncology

## 2018-07-31 ENCOUNTER — Other Ambulatory Visit: Payer: Self-pay | Admitting: Hematology and Oncology

## 2018-07-31 DIAGNOSIS — R160 Hepatomegaly, not elsewhere classified: Secondary | ICD-10-CM

## 2018-07-31 LAB — HCV RT-PCR, QUANT (GRAPH): HCV Quantitative: NOT DETECTED IU/mL (ref >50)

## 2018-07-31 NOTE — Telephone Encounter (Signed)
Re:  Tumor board and labs  Patient returned call this afternoon.  Discussed plan for referral to interventional radiology.  Patient was in agreement.  Reviewed clinic labs:  AFP was 7,329.0 Hepatitis C quantitative is negative s/p treatment. Hepatitis B core antibody is positive.  Several questions asked and answered.   Lequita Asal, MD

## 2018-07-31 NOTE — Telephone Encounter (Signed)
Re:  Tumor board discussions  Three attempts were made to contact the patient today regarding tumor board discussions.  No voicemail on patient's preferred number.  Order placed for interventional radiology consult.  Lequita Asal, MD

## 2018-08-03 ENCOUNTER — Other Ambulatory Visit: Payer: Self-pay | Admitting: Hematology and Oncology

## 2018-08-03 ENCOUNTER — Inpatient Hospital Stay (HOSPITAL_BASED_OUTPATIENT_CLINIC_OR_DEPARTMENT_OTHER): Payer: Managed Care, Other (non HMO) | Admitting: Hematology and Oncology

## 2018-08-03 DIAGNOSIS — R160 Hepatomegaly, not elsewhere classified: Secondary | ICD-10-CM | POA: Diagnosis not present

## 2018-08-03 NOTE — Progress Notes (Signed)
[facility]  [address], [address] [place], [place] [address] Phone: [phone]  Fax: [phone]   Telephone Office Visit:  08/03/2018  Referring physician: Donnie Coffin, MD  I connected with Chris Mcdonald on 08/03/18 at 2:07 PM EDT by phone and verified that I was speaking with the correct person using 2 identifiers.  The patient was at home.  I discussed the limitations, risk, security and privacy concerns of performing an evaluation and management service by telephone and the availability of in person appointments.  I also discussed with the patient that there may be a patient responsible charge related to this service.  The patient expressed understanding and agreed to proceed.    Chief Complaint: Chris Mcdonald is a 63 y.o. male with a history of hepatitis C and presumed hepatocellular carcinoma who is seen for review of work-up and discussion regarding direction of therapy.  HPI:  The patient was last seen in the medical oncology clinic on 07/27/2018 for initial consultation.  He has noted to have a large liver lesion discovered incidentally on a yearly low dose chest CT for lung cancer screening.  Abdomen MRI on 07/10/2018 revealed an 8.8 cm heterogeneous infiltrating segment 8. Findings were consistent with a hepatoma, given the underlying cirrhotic changes involving the liver.  The lesion was felt unresectable as it invaded and obstructed the middle and right portal veins and the middle hepatic vein.    The patient underwent a work-up.  AFP was 7329.  Hepatitis C by PCR was negative.  Hepatitis B surface antigen was negative.  Hepatitis B core antibody total was positive.  Ferritin was 297 with an iron saturation of 16% and a TIBC of 415.  PT was 12.0 with an INR of 0.9.  PTT was 33.  He was presented at tumor board on 07/30/2018.  He was felt to be a possible candidate for interventional radiology.  Symptomatically, he denies any new symptoms.  He  continues to feel "normal".   Past Medical History:  Diagnosis Date  . Arthritis   . Hepatitis   . Hypertension   . Spinal stenosis   . Stroke [facility])     Past Surgical History:  Procedure Laterality Date  . COLONOSCOPY WITH PROPOFOL N/A 02/21/2015   Procedure: COLONOSCOPY WITH PROPOFOL;  Surgeon: Lollie Sails, MD;  Location: [facility] ENDOSCOPY;  Service: Endoscopy;  Laterality: N/A;  . INGUINAL HERNIA REPAIR Bilateral   . POSTERIOR CERVICAL VERTEBRAE EXCISION      Family History  Problem Relation Age of Onset  . Cancer Brother   . Heart disease Brother     Social History:  reports that he has been smoking cigarettes. He has a 75.00 pack-year smoking history. He has never used smokeless tobacco. He reports that he does not drink alcohol or use drugs.  He lives in [place] with his wife and daughter.  He has a 50 year old daughter, 77 year old son, and 26 year old son.    Participants in the patient's visit and their role in the encounter included the patient and Chris Budge, Chris Mcdonald, today.  The intake visit was provided by Chris Budge, Chris Mcdonald.    Allergies: No Known Allergies  Current Medications: Current Outpatient Medications  Medication Sig Dispense Refill  . aspirin EC 81 MG tablet Take 81 mg by mouth daily.     . hydrochlorothiazide (HYDRODIURIL) 12.5 MG tablet Take 12.5 mg by mouth daily.     No current facility-administered  medications for this visit.     Review of Systems:  GENERAL:  Feels "normal".  No fevers, sweats.  Baseline weight 145-150 pounds.  Weight loss of 25-30 pounds. PERFORMANCE STATUS (ECOG):  1-2 HEENT:  No visual changes, runny nose, sore throat, mouth sores or tenderness. Lungs:  Shortness of breath with exertion.  No cough.  No hemoptysis. Cardiac:  No chest pain, palpitations, orthopnea, or PND.  Holter monitor planned for 08/07/2018. GI:  No nausea, vomiting, diarrhea, constipation, melena or hematochezia. GU:  No urgency, frequency,  dysuria, or hematuria. Musculoskeletal:  Arthritis in back pain.  No muscle tenderness. Extremities:  No pain or swelling. Skin:  No rashes or skin changes. Neuro:   Chronic headache.  S/p left sided CVA.  Numbness in legs and right hand.  No balance or coordination issues. Endocrine:  No diabetes, thyroid issues, hot flashes or night sweats. Psych:  No mood changes, depression or anxiety. Pain:  Neck pain (7 out of 10), chronic. Review of systems:  All other systems reviewed and found to be negative.   No visits with results within 3 Day(s) from this visit.  Latest known visit with results is:  Appointment on 07/27/2018  Component Date Value Ref Range Status  . HCV Quantitative 07/27/2018 HCV Not Detected  >50 IU/mL Final  . Test Information 07/27/2018 Comment   Final   Comment: (NOTE) The quantitative range of this assay is 15 IU/mL to 100 million IU/mL. Performed At: [facility] [place], [place] [id number] Rush Farmer MD ZL:[id number]   . Hepatitis B Surface Ag 07/27/2018 Negative  Negative Final   Comment: (NOTE) Performed At: [facility] [place], [place] [id number] Rush Farmer MD [id number]   . Hep B Core Total Ab 07/27/2018 Positive* Negative Final   Comment: (NOTE) Performed At: [facility] [place], [place] [id number] Rush Farmer MD TD:[id number]   . Ferritin 07/27/2018 297  24 - 336 ng/mL Final   Performed at [facility], [address] [place], [place] [address]  . Iron 07/27/2018 68  45 - 182 ug/dL Final  . TIBC 07/27/2018 415  250 - 450 ug/dL Final  . Saturation Ratios 07/27/2018 16* 17.9 - 39.5 % Final  . UIBC 07/27/2018 347  ug/dL Final   Performed at [facility], [address] [place], [place] [address]  . aPTT 07/27/2018 33  24 - 36 seconds Final   Performed at [facility], [address] [place], [place] [address]  . Prothrombin Time  07/27/2018 12.0  11.4 - 15.2 seconds Final  . INR 07/27/2018 0.9  0.8 - 1.2 Final   Comment: (NOTE) INR goal varies based on device and disease states. Performed at [facility], [address], [place], [place] [address]   . AFP, Serum, Tumor Marker 07/27/2018 7,329.0* 0.0 - 8.3 ng/mL Final   Comment: (NOTE) Results confirmed on dilution. Roche [employer] Electrochemiluminescence Immunoassay (ECLIA) Values obtained with different assay methods or kits cannot be used interchangeably.  Results cannot be interpreted as absolute evidence of the presence or absence of malignant disease. This test is not interpretable in pregnant females. Performed At: [facility] [place], [place] [id number] Rush Farmer MD GN:[id number]     Assessment:  Alquan Morrish is a 63 y.o. male with unresectable hepatocellular carcinoma.  He has a history of hepatitis C (treated with Harvoni in 04/11/2015).  He has hepatitis B.  Mass was discovered incidentally  on low dose chest CT screenig program.  AFP was 7329.  Child-Pugh Class is A (score 5).  Low dose chest CT on 06/18/2018 revealed Lung-RADS 4A-S, suspicious.  There was a new centrilobular nodularity in both lungs, more likely inflammatory. Follow up low-dose chest CT without contrast in 3 months was recommended.  Three was a 8.5 x 5.7 cm poorly marginated geographic region of low attenuation in the superior right liver, new since 06/17/2017.   Abdomen MRI on 07/10/2018 revealed an 8.8 cm heterogeneous infiltrating segment 8.  Findings were consistent with a hepatoma, given the underlying cirrhotic changes involving the liver. This was also invading and obstructing the middle and right portal veins and the middle hepatic vein.  There were scattered abdominal lymph nodes but no definite findings for metastatic disease.  Symptomatically, he has lost weight.  He denies any abdominal symptoms.  Plan: 1.   Hepatocellular  carcinoma  Discuss diagnosis of hepatocellular carcinoma.   Review labs and elevation of AFP.    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  Discuss tumor board conversation.  Lesion is unresectable based on size and involvement of major vessels.  Discuss treatment is palliative.  Discuss consideration of arterially directed therapy through interventional radiology.   Patient interested in consultation with interventional radiology (schedule).  Discuss systemic therapy with lenvatinib.   Potential side effects reviewed including hypertension , increased QT, GI toxicity (diarrhea, perforation/fistula, elevated LFTs), bleeding, electrolyte issues.    Discuss need for baseline EKG (re:QTc).   Mail information to patient on lenvatinib.  2.   Lung nodularity  Follow-up chest CT on 09/16/2018. 3.   Schedule interventional radiology consultation. 4.   RTC based on above.  If delays in localized therapy secondary to COVID-19, anticipate starting lenvatinib.    I discussed the assessment and treatment plan with the patient.  The patient was provided an opportunity to ask questions and all were answered.  The patient agreed with the plan and demonstrated an understanding of the instructions.  The patient was advised to call back or seek an in person evaluation if the symptoms worsen or if the condition fails to improve as anticipated.  I provided 11 minutes (2:07 PM - 2:18 PM) of non-face-to-face time during this encounter.  I provided these services from the [facility] office.   Lequita Asal, MD, PhD  08/03/2018, 2:08 PM

## 2018-08-03 NOTE — Progress Notes (Signed)
Confirmed name, DOB, and address. Denies any concerns.  

## 2018-08-04 ENCOUNTER — Ambulatory Visit
Admission: RE | Admit: 2018-08-04 | Discharge: 2018-08-04 | Disposition: A | Discharge status: Home / Self Care | Payer: Medicare Other | Source: Ambulatory Visit | Attending: Hematology and Oncology | Admitting: Hematology and Oncology

## 2018-08-04 ENCOUNTER — Encounter: Payer: Self-pay | Admitting: *Deleted

## 2018-08-04 ENCOUNTER — Other Ambulatory Visit: Payer: Self-pay | Admitting: *Deleted

## 2018-08-04 DIAGNOSIS — R160 Hepatomegaly, not elsewhere classified: Secondary | ICD-10-CM

## 2018-08-04 HISTORY — PX: IR RADIOLOGIST EVAL & MGMT: IMG5224

## 2018-08-04 NOTE — Consult Note (Signed)
Chief Complaint: Patient was consulted remotely today (TeleHealth) for hepatocellular cancer at the request of [place] C.    Referring Physician(s): Corcoran,Melissa C  History of Present Illness: Chris Mcdonald is a 63 y.o. 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  The imaging findings are diagnostic of advanced hepatocellular carcinoma.  I spoke with him today via telephone to discuss the potential for liver directed therapy.  Currently, Chris Mcdonald is in his usual state of health.  He denies abdominal pain, fever, nausea, vomiting or decreased appetite.  He reports that he has significant fatigue and early satiety.  He has lost a few pounds over the last several years but denies significant weight loss.  His performance status is relatively good.  He performs the majority of his activities of daily living.  He does have some residual disability from an accident with cervical spine fracture in 2015 followed subsequently by a stroke.  He is an active smoker currently smoking 1.5 packs a day.  He has been smoking since he was 63 years old.  He does not drink alcohol or use recreational drugs.  He does not currently have a CBC or complete metabolic panel in the system.  Past Medical History:  Diagnosis Date   Arthritis    Hepatitis    Hypertension    Spinal stenosis    Stroke [facility])     Past Surgical History:  Procedure Laterality Date   COLONOSCOPY WITH PROPOFOL N/A 02/21/2015   Procedure: COLONOSCOPY WITH PROPOFOL;  Surgeon: Lollie Sails, MD;  Location: [facility] ENDOSCOPY;  Service: Endoscopy;  Laterality: N/A;   INGUINAL HERNIA REPAIR Bilateral    IR RADIOLOGIST EVAL &  MGMT  08/04/2018   POSTERIOR CERVICAL VERTEBRAE EXCISION      Allergies: Patient has no known allergies.  Medications: Prior to Admission medications   Medication Sig Start Date End Date Taking? Authorizing Provider  aspirin EC 81 MG tablet Take 81 mg by mouth daily.     [provider]  hydrochlorothiazide (HYDRODIURIL) 12.5 MG tablet Take 12.5 mg by mouth daily.    [provider]     Family History  Problem Relation Age of Onset   Cancer Brother    Heart disease Brother     Social History   Socioeconomic History   Marital status: Married    Spouse name: Not on file   Number of children: Not on file   Years of education: Not on file   Highest education level: Not on file  Occupational History   Not on file  Social Needs   Financial resource strain: Not on file   Food insecurity:    Worry: Not on file    Inability: Not on file   Transportation needs:    Medical: Not on file    Non-medical: Not on file  Tobacco Use   Smoking status: Current Every Day Smoker    Packs/day: 1.50    Years: 50.00    Pack years: 75.00    Types: Cigarettes   Smokeless tobacco: Never Used  Substance and Sexual Activity   Alcohol use: No   Drug use: No   Sexual activity: Not on file  Lifestyle   Physical  activity:    Days per week: Not on file    Minutes per session: Not on file   Stress: Not on file  Relationships   Social connections:    Talks on phone: Not on file    Gets together: Not on file    Attends religious service: Not on file    Active member of club or organization: Not on file    Attends meetings of clubs or organizations: Not on file    Relationship status: Not on file  Other Topics Concern   Not on file  Social History Narrative   Not on file    ECOG Status: 1 - Symptomatic but completely ambulatory  Review of Systems  Review of Systems: A 12 point ROS discussed and pertinent positives are indicated in the HPI above.   All other systems are negative.  Physical Exam No direct physical exam was performed (except for noted visual exam findings with Video Visits).   Vital Signs: There were no vitals taken for this visit.  Imaging: Mr Abdomen Wwo Contrast  Result Date: 07/10/2018 CLINICAL DATA:  Evaluate liver lesions seen on recent chest CT. EXAM: MRI ABDOMEN WITHOUT AND WITH CONTRAST TECHNIQUE: Multiplanar multisequence MR imaging of the abdomen was performed both before and after the administration of intravenous contrast. CONTRAST:  5 cc Gadavist COMPARISON:  Chest CT 06/18/2018 FINDINGS: Lower chest: The lung bases are clear of an acute process. No pleural effusion. No pericardial effusion. Hepatobiliary: Cirrhotic changes involving the liver with irregular liver contour, right hepatic lobe atrophy, increased caudate to right lobe ratio, prominent hepatic fissures and portal venous collaterals. There is a large infiltrating mass involving the central aspect of the liver and occupying most of segment 8. This is also invading and obstructing the middle and right portal veins and the middle hepatic vein all the which are markedly dilated and filled with tumor. There are also small daughter lesions surrounding the main tumor. The gallbladder is mildly contracted. No intra or extrahepatic biliary dilatation. Pancreas:  No mass, inflammation or ductal dilatation. Spleen:  Normal size.  No focal lesions. Adrenals/Urinary Tract:  The adrenal glands and kidneys are normal. Stomach/Bowel: The stomach, duodenum, visualized small bowel and visualize colon are grossly normal. Vascular/Lymphatic: The aorta is normal in caliber. Atherosclerotic changes are noted. The branch vessels are patent. Small scattered mesenteric and retroperitoneal nodes, typical with cirrhosis. Other:  Small amount of perihepatic fluid but no gross ascites. Musculoskeletal: No significant bony findings. IMPRESSION: 1. Large heterogeneous infiltrating segment  8 liver lesion measuring up to 8.8 cm. Findings consistent with a hepatoma, given the underlying cirrhotic changes involving the liver. This is also invading and obstructing the middle and right portal veins and the middle hepatic vein. 2. Scattered abdominal lymph nodes but no definite findings for metastatic disease. Electronically Signed   By: Marijo Sanes M.D.   On: 07/10/2018 09:18   Ir Radiologist Eval & Mgmt  Result Date: 08/04/2018 Please refer to notes tab for details about interventional procedure. (Op Note)   Labs:  CBC: No results for input(s): WBC, HGB, HCT, PLT in the last 8760 hours.  COAGS: Recent Labs    07/27/18 1440  INR 0.9  APTT 33    BMP: Recent Labs    07/10/18 0818  CREATININE 0.80    LIVER FUNCTION TESTS: No results for input(s): BILITOT, AST, ALT, ALKPHOS, PROT, ALBUMIN in the last 8760 hours.  TUMOR MARKERS: No results for input(s): AFPTM, CEA, CA199, CHROMGRNA in  the last 8760 hours.  Assessment and Plan:  Chris Mcdonald has an infiltrative approximately 8.8 cm mass centered in the right hepatic lobe with both right portal venous and right hepatic venous invasion.  Unfortunately, this is considered advanced stage (C) by the [place] [facility] liver cancer staging system.  Due to the portal venous invasion, he is not a candidate for bland embolization or chemoembolization.  Pending the results of his complete metabolic panel, if his bilirubin is less than 2 he would be a candidate for a right hepatic lobar transarterial radioembolization with Y 90.  The radial embolization procedure has less arterial embolic effect and would be considered safe even in the setting of portal venous thrombosis.  Additionally, he will also likely be a candidate for concurrent chemo or immunotherapy.  This could be performed in conjunction with or following Y 90 radioembolization.  I described to him in detail the nature of the transarterial radio embolization procedure.  He  understands that we must first evaluate his labs to make sure his hepatic reserve is within criteria to proceed with treatment.  Next, he would undergo a pre-Y 48 mapping study where we would evaluate his arterial supply, embolize any accessory arteries to the stomach, duodenum or other structures and perform a test dose to evaluate his lung shunt fraction.  This would then allow [place] to decide if he was a candidate for right lobar transarterial radioembolization therapy.  The radio embolization itself would be performed 1 to 2 weeks after the planning study.  He understands.  He also understands the potential side effects including postembolization syndrome, fatigue, radiation induced liver injury and neutropenia.  He also understands that this is a palliative therapy rather than a curative therapy.  Time was given to answer questions.  He understands the plan and desires to proceed.  1.)  My office will help facilitate obtaining a CBC and CMP. 2.)  Tentatively scheduled for pre-Y 49 mapping study to be followed by a right lobar transarterial radioembolization with Y 90.    Thank you for this interesting consult.  I greatly enjoyed meeting Chris Mcdonald and look forward to participating in their care.  A copy of this report was sent to the requesting provider on this date.  Electronically Signed: Jacqulynn Cadet 08/04/2018, 10:33 AM   I spent a total of  40 Minutes   in remote  clinical consultation, greater than 50% of which was counseling/coordinating care for hepatocellular cancer.    Visit type: Audio only (telephone). Audio (no video) only due to patient's lack of internet/smartphone capability. Alternative for in-person consultation at [facility], [place] [address] [place], [place], [place]. This visit type was conducted due to national recommendations for restrictions regarding the COVID-19 Pandemic (e.g. social distancing).  This format is felt to be most appropriate for this patient at  this time.  All issues noted in this document were discussed and addressed.

## 2018-08-05 ENCOUNTER — Other Ambulatory Visit: Payer: Self-pay | Admitting: Hematology and Oncology

## 2018-08-05 ENCOUNTER — Telehealth: Payer: Self-pay | Admitting: Pharmacist

## 2018-08-05 DIAGNOSIS — C220 Liver cell carcinoma: Secondary | ICD-10-CM

## 2018-08-05 MED ORDER — LENVATINIB (8 MG DAILY DOSE) 2 X 4 MG PO CPPK: 8.0000 mg | ORAL_CAPSULE | Freq: Every day | ORAL | 0 refills | Status: DC

## 2018-08-05 MED ORDER — LENVATINIB (12 MG DAILY DOSE) 3 X 4 MG PO CPPK: 12.0000 mg | ORAL_CAPSULE | Freq: Every day | ORAL | 0 refills | Status: DC

## 2018-08-05 NOTE — Progress Notes (Signed)
START OFF PATHWAY REGIMEN - Other Dx   [id number]:Lenvatinib:   Once daily:     Lenvatinib   **Always confirm dose/schedule in your pharmacy ordering system**  Patient Characteristics: Intent of Therapy: Non-Curative / Palliative Intent, Discussed with Patient

## 2018-08-05 NOTE — Telephone Encounter (Signed)
Oral Oncology Pharmacist Encounter  Received new prescription for Lenvima (lenvatinib) for the treatment of unresectable hepatocellular carcinoma, planned duration until disease progression or unacceptable drug toxicity.  Recommend checking a CBC, CMP, and UA for proteinuria prior to the start of lenvatinib. BP from 07/27/18 assessed, BP well controled, continue to monitor. Prescription dose and frequency assessed. Patient weight <60kg so he will start on Lenvima 8 mg dosing.  Current medication list in Epic reviewed, no DDIs with lenvatinib identified.  Prescription has been e-scribed to the [facility] for benefits analysis and approval.  Oral Oncology Clinic will continue to follow for insurance authorization, copayment issues, initial counseling and start date.  Darl Pikes, PharmD, BCPS, [facility] Hematology/Oncology Clinical Pharmacist [facility] [place] [facility] [phone]  08/05/2018 1:42 PM

## 2018-08-06 ENCOUNTER — Telehealth: Payer: Self-pay | Admitting: Pharmacist

## 2018-08-06 DIAGNOSIS — C220 Liver cell carcinoma: Secondary | ICD-10-CM

## 2018-08-06 NOTE — Telephone Encounter (Signed)
Oral Oncology Pharmacist Encounter   Received notification from [facility] that prior authorization for Michel [place] is required.   PA submitted on CMM Key [id number] Status is pending   Oral Oncology Clinic will continue to follow.   Darl Pikes, PharmD, BCPS, [facility] Hematology/Oncology Clinical Pharmacist [facility] [place] [facility] [phone]  08/06/2018 9:28 AM

## 2018-08-07 ENCOUNTER — Other Ambulatory Visit: Payer: Self-pay

## 2018-08-07 ENCOUNTER — Other Ambulatory Visit
Admission: RE | Admit: 2018-08-07 | Discharge: 2018-08-07 | Disposition: A | Discharge status: Home / Self Care | Payer: Medicare Other | Source: Ambulatory Visit | Attending: Interventional Radiology | Admitting: Interventional Radiology

## 2018-08-07 DIAGNOSIS — R160 Hepatomegaly, not elsewhere classified: Secondary | ICD-10-CM | POA: Diagnosis present

## 2018-08-07 LAB — CBC
HCT: 44.1 % (ref 39.0–52.0)
Hemoglobin: 14.2 g/dL (ref 13.0–17.0)
MCH: 29.2 pg (ref 26.0–34.0)
MCHC: 32.2 g/dL (ref 30.0–36.0)
MCV: 90.7 fL (ref 80.0–100.0)
Platelets: 224 10*3/uL (ref 150–400)
RBC: 4.86 MIL/uL (ref 4.22–5.81)
RDW: 15.1 % (ref 11.5–15.5)
WBC: 7 10*3/uL (ref 4.0–10.5)
nRBC: 0 % (ref 0.0–0.2)

## 2018-08-07 LAB — COMPREHENSIVE METABOLIC PANEL
ALT: 48 U/L — ABNORMAL HIGH (ref 0–44)
AST: 49 U/L — ABNORMAL HIGH (ref 15–41)
Albumin: 3.5 g/dL (ref 3.5–5.0)
Alkaline Phosphatase: 202 U/L — ABNORMAL HIGH (ref 38–126)
Anion gap: 7 (ref 5–15)
BUN: 10 mg/dL (ref 6–20)
CO2: 26 mmol/L (ref 22–32)
Calcium: 10.1 mg/dL (ref 8.9–10.3)
Chloride: 103 mmol/L (ref 98–111)
Creatinine, Ser: 0.74 mg/dL (ref 0.61–1.24)
GFR calc Af Amer: >60 mL/min (ref >60)
GFR calc non Af Amer: >60 mL/min (ref >60)
Glucose, Bld: 77 mg/dL (ref 70–99)
Potassium: 3.7 mmol/L (ref 3.5–5.1)
Sodium: 136 mmol/L (ref 135–145)
Total Bilirubin: 0.7 mg/dL (ref 0.3–1.2)
Total Protein: 7.6 g/dL (ref 6.5–8.1)

## 2018-08-11 ENCOUNTER — Telehealth: Payer: Self-pay

## 2018-08-11 NOTE — Telephone Encounter (Signed)
Unable to speak with the patient / no answer. The patient was seen by  Dr. Catha Brow MD on 08/04/2018 by telemedicine / phone call

## 2018-08-12 ENCOUNTER — Telehealth: Payer: Self-pay | Admitting: Pharmacist

## 2018-08-12 MED ORDER — LENVATINIB (8 MG DAILY DOSE) 2 X 4 MG PO CPPK: 8.0000 mg | ORAL_CAPSULE | Freq: Every day | ORAL | 0 refills | Status: DC

## 2018-08-12 NOTE — Telephone Encounter (Signed)
Oral Chemotherapy Pharmacist Encounter  Successfully enrolled patient in copay card for [place] by calling the [facility].  ID: [id number] [place]: [id number] Group: [id number]  Billing information will be shared with [place], PharmD, BCPS Hematology/Oncology Clinical Pharmacist [facility] [phone]  08/12/2018 2:39 PM

## 2018-08-12 NOTE — Telephone Encounter (Signed)
Oral Chemotherapy Pharmacist Encounter   I had not heard back through [facility] for the PA for the [place]. Called [employer] at  [phone], they said I needed to have the authorization go through [place]. Spoke to the people at [place] and they went through their authorization process, they then transferred me back to [place] who was able to approve the [place].   PA#: [id number] Effective 08/06/2018 through 08/12/2019  Prescription must be filled at [facility].  Darl Pikes, PharmD, BCPS, [facility] Hematology/Oncology Clinical Pharmacist [facility] Oral [place] [facility] [phone]  08/12/2018 11:02 AM

## 2018-08-14 NOTE — Telephone Encounter (Signed)
Oral Chemotherapy Pharmacist Encounter   [facility] pharmacy has set Mr. Froning up for a 08/18/18 delivery of his Lenvima. Dr. Mike Gip wants him to hold on starting the Lenvima until she gives him the go ahead.  Attempted to call Mr. [place] to let him know the above plan, no answer and unable to LVM. Will attempt to call patient again later.  Darl Pikes, PharmD, BCPS, [facility] Hematology/Oncology Clinical Pharmacist [facility] [place] [facility] [phone]  08/14/2018 11:17 AM

## 2018-08-16 DIAGNOSIS — Z7189 Other specified counseling: Secondary | ICD-10-CM | POA: Insufficient documentation

## 2018-08-16 NOTE — Progress Notes (Signed)
[facility]  [address] [place], [place] [address] Phone: [phone]  Fax: [phone]   Office Visit:  08/17/2018  Referring physician: Donnie Coffin, MD  Chief Complaint: Chris Mcdonald is a 63 y.o. male with a history of hepatitis C and presumed hepatocellular carcinoma who is seen for initiation of lenvatinib.  HPI:  The patient was last seen in the medical oncology clinic on 08/03/2018 for telephone assessment.   At that time, he had lost weight.  He denied any abdominal symptoms.  We discussed consultation with interventional radiology for liver directed therapy.  We discussed therapy with lenvatinib or sorafinib.  He met with Dr. Jacqulynn Cadet on 08/04/2018.  Notes reviewed.  He was felt to have advanced stage disease (C by [place] [facility] liver staging system).  Due to the portal venous invasion, he was not a candidate for bland embolization or chemoembolization.  If his bilirubin was less than 2, he was felt to be a candidate for a right hepatic lobar transarterial radioembolization with Y-90.  Therapy has been postponed secondary to COVID-19 pandemic.  He was preauthorized for lenvatinib.  During the interim, he has been doing "fair".  He describes having sharp pains every "now and then" in his abdomen.  He is eating well.  He denies any nausea, vomiting, diarrhea or constipation.  He has gained 8 pounds.   Past Medical History:  Diagnosis Date  . Arthritis   . Hepatitis   . Hypertension   . Spinal stenosis   . Stroke [facility])     Past Surgical History:  Procedure Laterality Date  . COLONOSCOPY WITH PROPOFOL N/A 02/21/2015   Procedure: COLONOSCOPY WITH PROPOFOL;  Surgeon: Lollie Sails, MD;  Location: [facility] ENDOSCOPY;  Service: Endoscopy;  Laterality: N/A;  . INGUINAL HERNIA REPAIR Bilateral   . IR RADIOLOGIST EVAL & MGMT  08/04/2018  . POSTERIOR CERVICAL VERTEBRAE EXCISION      Family History  Problem Relation Age of  Onset  . Cancer Brother   . Heart disease Brother     Social History:  reports that he has been smoking cigarettes. He has a 75.00 pack-year smoking history. He has never used smokeless tobacco. He reports that he does not drink alcohol or use drugs.  He lives in [place] with his wife and daughter.  He has a 33 year old daughter, 46 year old son, and 29 year old son.  Patient was alone in clinic.  Allergies: No Known Allergies  Current Medications: Current Outpatient Medications  Medication Sig Dispense Refill  . aspirin EC 81 MG tablet Take 81 mg by mouth daily.     . hydrochlorothiazide (HYDRODIURIL) 12.5 MG tablet Take 12.5 mg by mouth daily.    Marland Kitchen lenvatinib 8 mg daily dose (LENVIMA, 8 MG DAILY DOSE,) 2 x 4 MG capsule Take 2 capsules (8 mg total) by mouth daily. (Patient not taking: Reported on 08/17/2018) 60 capsule 0   No current facility-administered medications for this visit.     Review of Systems  Constitutional: Negative.  Negative for chills, diaphoresis, fever, malaise/fatigue and weight loss (up 8 pounds).       Feels "fair".  HENT: Negative.  Negative for congestion, ear pain, nosebleeds, sinus pain and sore throat.   Eyes: Negative.  Negative for blurred vision, double vision, photophobia and pain.  Respiratory: Positive for shortness of breath (on exertion). Negative for cough, sputum production and wheezing.   Cardiovascular: Negative.  Negative for chest  pain, palpitations, orthopnea, leg swelling and PND.  Gastrointestinal: Positive for abdominal pain (intermittent sharp). Negative for blood in stool, constipation, diarrhea, heartburn, melena, nausea and vomiting.       Eating well.  Genitourinary: Negative.  Negative for dysuria, frequency, hematuria and urgency.  Musculoskeletal: Negative for back pain, joint pain, myalgias and neck pain (steel rods in neck).  Skin: Negative.  Negative for itching and rash.  Neurological: Positive for headaches (chronic). Negative for  dizziness, sensory change, speech change, focal weakness and weakness.       S/p left sided CVA.  Endo/Heme/Allergies: Negative.  Does not bruise/bleed easily.  Psychiatric/Behavioral: Negative.  Negative for depression and memory loss. The patient is not nervous/anxious and does not have insomnia.     Performance status: 1  Vital signs: BP (!) 123/100 (BP Location: Left Arm, Patient Position: Sitting)   Pulse 81   Temp 97.9 F (36.6 C) (Tympanic)   Resp 18   Ht '5\' 7"'$  (1.702 m)   Wt 127 lb 10.3 oz (57.9 kg)   SpO2 100%   BMI 19.99 kg/m    Physical Exam  Nursing note and vitals reviewed. Constitutional: He is oriented to person, place, and time. No distress.  Thin gentleman sitting comfortably in the exam room.  HENT:  Head: Normocephalic and atraumatic.  Mouth/Throat: Oropharynx is clear and moist. No oropharyngeal exudate.  Graying hair.  Beard.  Eyes: Pupils are equal, round, and reactive to light. Conjunctivae and EOM are normal. No scleral icterus.  Blue eyes.  Neck: Normal range of motion. Neck supple. No JVD present.  Cardiovascular: Normal rate and normal heart sounds. Exam reveals no gallop and no friction rub.  No murmur heard. Respiratory: Effort normal and breath sounds normal. No respiratory distress. He has no wheezes. He has no rales.  GI: Soft. Bowel sounds are normal. He exhibits no distension and no mass. There is no abdominal tenderness. There is no rebound and no guarding.  Liver palpable medially.  Musculoskeletal: Normal range of motion.        General: No edema.  Lymphadenopathy:    He has no cervical adenopathy.  Neurological: He is alert and oriented to person, place, and time.  Skin: Skin is warm and dry. No rash noted. He is not diaphoretic. No erythema. No pallor.  Psychiatric: He has a normal mood and affect. His behavior is normal. Judgment normal.    No visits with results within 3 Day(s) from this visit.  Latest known visit with results is:   Hospital Outpatient Visit on 08/07/2018  Component Date Value Ref Range Status  . Sodium 08/07/2018 136  135 - 145 mmol/L Final  . Potassium 08/07/2018 3.7  3.5 - 5.1 mmol/L Final  . Chloride 08/07/2018 103  98 - 111 mmol/L Final  . CO2 08/07/2018 26  22 - 32 mmol/L Final  . Glucose, Bld 08/07/2018 77  70 - 99 mg/dL Final  . BUN 08/07/2018 10  6 - 20 mg/dL Final  . Creatinine, Ser 08/07/2018 0.74  0.61 - 1.24 mg/dL Final  . Calcium 08/07/2018 10.1  8.9 - 10.3 mg/dL Final  . Total Protein 08/07/2018 7.6  6.5 - 8.1 g/dL Final  . Albumin 08/07/2018 3.5  3.5 - 5.0 g/dL Final  . AST 08/07/2018 49* 15 - 41 U/L Final  . ALT 08/07/2018 48* 0 - 44 U/L Final  . Alkaline Phosphatase 08/07/2018 202* 38 - 126 U/L Final  . Total Bilirubin 08/07/2018 0.7  0.3 - 1.2  mg/dL Final  . GFR calc non Af Amer 08/07/2018 >60  >60 mL/min Final  . GFR calc Af Amer 08/07/2018 >60  >60 mL/min Final  . Anion gap 08/07/2018 7  5 - 15 Final   Performed at [facility], [address], [place], [place] [address]  . WBC 08/07/2018 7.0  4.0 - 10.5 K/uL Final  . RBC 08/07/2018 4.86  4.22 - 5.81 MIL/uL Final  . Hemoglobin 08/07/2018 14.2  13.0 - 17.0 g/dL Final  . HCT 08/07/2018 44.1  39.0 - 52.0 % Final  . MCV 08/07/2018 90.7  80.0 - 100.0 fL Final  . MCH 08/07/2018 29.2  26.0 - 34.0 pg Final  . MCHC 08/07/2018 32.2  30.0 - 36.0 g/dL Final  . RDW 08/07/2018 15.1  11.5 - 15.5 % Final  . Platelets 08/07/2018 224  150 - 400 K/uL Final  . nRBC 08/07/2018 0.0  0.0 - 0.2 % Final   Performed at [facility], [address] [place], [place] [address]    Assessment:  Chris Mcdonald is a 63 y.o. male with unresectable hepatocellular carcinoma.  He has a history of hepatitis C (treated with Harvoni in 04/11/2015).  He has hepatitis B.  Mass was discovered incidentally on low dose chest CT screenig program.  AFP was 7329.  Child-Pugh Class is A (score 5).  Low dose chest CT on 06/18/2018 revealed Lung-RADS  4A-S, suspicious.  There was a new centrilobular nodularity in both lungs, more likely inflammatory. Follow up low-dose chest CT without contrast in 3 months was recommended.  Three was a 8.5 x 5.7 cm poorly marginated geographic region of low attenuation in the superior right liver, new since 06/17/2017.   Abdomen MRI on 07/10/2018 revealed an 8.8 cm heterogeneous infiltrating segment 8.  Findings were consistent with a hepatoma, given the underlying cirrhotic changes involving the liver. This was also invading and obstructing the middle and right portal veins and the middle hepatic vein.  There were scattered abdominal lymph nodes but no definite findings for metastatic disease.  Symptomatically, he has intermittent sharp right upper abdominal pain.  Exam reveals a palpable liver.  Plan: 1.   Labs today:  CBC with diff, CMP, AFP, Mg. 2.   Hepatocellular carcinoma  Discuss diagnosis of hepatocellular carcinoma.   Review labs and elevation of AFP.    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  Lesion is unresectable based on size and involvement of major vessels.  Treatment is palliative.  Discuss consult with Dr. Jacqulynn Cadet, interventional radiologist.   He is a candidate for a right hepatic lobar transarterial radioembolization with Y-90.     Therapy has been postponed secondary to COVID-19 pandemic.  Discuss systemic therapy with lenvatinib (TKI, VEGF inhibitor).   Phase II trial:  46 patients, nenvatinib, PR 37% and SD 41%.  TTP 7.8 months  OS 18.7 months.   REFLECT trial:  954 patient, lenvatinib vs sorafenib.  RR 24% vs 9 %.  TTP 7.4 months vs 3.7 months.  OS 13.6 months vs 12.3 months.   Potential side effects: hypertension , increased QTc, hand-foot skin reaction, GI toxicity (diarrhea, perforation/fistula, elevated LFTs), bleeding, proteinuria, electrolyte issues.    Baseline EKG (re:QTc) today.    Phone follow-up with Dr Kandis Cocking, [facility] cardiology [phone];  [phone], [phone]).  Discuss comparison of Y90 radioembolization with sorafenib in BLCL B and C patients (SIRveNIB and SARAH trial)   Similar overall survival but fewer side effects with  Y90 radioembolization with SIRSpheres.   Lenvatinib shown non-inferior to sorafenib regarding overall survival.  Discuss unclear benefit of lenvatinib + Y90.  Combination not routinely used.  Discuss plan for lenvatinib for 2-3 months with reimaging then Y-90 if progression.  Patient in agreement with plan.  Patient consented tot treatment.  3.   Lung nodularity  Etiology unclear.  Anticipate chest CT around 09/16/2018. 4.   EKG today. 5.   RTC in 2 weeks for MD assessment and labs (CBC with diff, CMP).    Addendum:  EKG revealed NSR with QTc 442 ms. Calcium was 11.0.  Patient contacted regarding additional labs (repeat calcium, PTH, PTH-rp).  Preauth Zometa.  I discussed the assessment and treatment plan with the patient.  The patient was provided an opportunity to ask questions and all were answered.  The patient agreed with the plan and demonstrated an understanding of the instructions.  The patient was advised to call back or seek an in person evaluation if the symptoms worsen or if the condition fails to improve as anticipated.   Lequita Asal, MD, PhD  08/17/2018, 2:19 PM

## 2018-08-17 ENCOUNTER — Encounter: Payer: Self-pay | Admitting: Hematology and Oncology

## 2018-08-17 ENCOUNTER — Other Ambulatory Visit: Payer: Self-pay

## 2018-08-17 ENCOUNTER — Inpatient Hospital Stay (HOSPITAL_BASED_OUTPATIENT_CLINIC_OR_DEPARTMENT_OTHER): Payer: Managed Care, Other (non HMO) | Admitting: Hematology and Oncology

## 2018-08-17 ENCOUNTER — Telehealth: Payer: Self-pay

## 2018-08-17 ENCOUNTER — Inpatient Hospital Stay: Payer: Managed Care, Other (non HMO)

## 2018-08-17 VITALS — BP 123/100 | HR 81 | Temp 97.9°F | Resp 18 | Ht 67.0 in | Wt 127.6 lb

## 2018-08-17 DIAGNOSIS — C220 Liver cell carcinoma: Secondary | ICD-10-CM | POA: Diagnosis not present

## 2018-08-17 DIAGNOSIS — Z7189 Other specified counseling: Secondary | ICD-10-CM | POA: Diagnosis not present

## 2018-08-17 DIAGNOSIS — F17210 Nicotine dependence, cigarettes, uncomplicated: Secondary | ICD-10-CM

## 2018-08-17 LAB — CBC WITH DIFFERENTIAL/PLATELET
Abs Immature Granulocytes: 0.02 10*3/uL (ref 0.00–0.07)
Basophils Absolute: 0.1 10*3/uL (ref 0.0–0.1)
Basophils Relative: 1 %
Eosinophils Absolute: 0.1 10*3/uL (ref 0.0–0.5)
Eosinophils Relative: 2 %
HCT: 48.9 % (ref 39.0–52.0)
Hemoglobin: 16.1 g/dL (ref 13.0–17.0)
Immature Granulocytes: 0 %
Lymphocytes Relative: 22 %
Lymphs Abs: 1.7 10*3/uL (ref 0.7–4.0)
MCH: 30 pg (ref 26.0–34.0)
MCHC: 32.9 g/dL (ref 30.0–36.0)
MCV: 91.1 fL (ref 80.0–100.0)
Monocytes Absolute: 0.4 10*3/uL (ref 0.1–1.0)
Monocytes Relative: 6 %
Neutro Abs: 5.1 10*3/uL (ref 1.7–7.7)
Neutrophils Relative %: 69 %
Platelets: 256 10*3/uL (ref 150–400)
RBC: 5.37 MIL/uL (ref 4.22–5.81)
RDW: 15.3 % (ref 11.5–15.5)
WBC: 7.4 10*3/uL (ref 4.0–10.5)
nRBC: 0 % (ref 0.0–0.2)

## 2018-08-17 LAB — COMPREHENSIVE METABOLIC PANEL
ALT: 38 U/L (ref 0–44)
AST: 50 U/L — ABNORMAL HIGH (ref 15–41)
Albumin: 4.1 g/dL (ref 3.5–5.0)
Alkaline Phosphatase: 214 U/L — ABNORMAL HIGH (ref 38–126)
Anion gap: 8 (ref 5–15)
BUN: 11 mg/dL (ref 6–20)
CO2: 27 mmol/L (ref 22–32)
Calcium: 11 mg/dL — ABNORMAL HIGH (ref 8.9–10.3)
Chloride: 100 mmol/L (ref 98–111)
Creatinine, Ser: 0.83 mg/dL (ref 0.61–1.24)
GFR calc Af Amer: >60 mL/min (ref >60)
GFR calc non Af Amer: >60 mL/min (ref >60)
Glucose, Bld: 95 mg/dL (ref 70–99)
Potassium: 4.2 mmol/L (ref 3.5–5.1)
Sodium: 135 mmol/L (ref 135–145)
Total Bilirubin: 0.8 mg/dL (ref 0.3–1.2)
Total Protein: 8.4 g/dL — ABNORMAL HIGH (ref 6.5–8.1)

## 2018-08-17 LAB — MAGNESIUM: Magnesium: 2.1 mg/dL (ref 1.7–2.4)

## 2018-08-17 NOTE — Telephone Encounter (Signed)
Spoke with the patient to inform him that his calicum was to high and to insure he was not taking any calcium. At this time the patient report no he is not taking any calcium at this time. The patient was understanding to me telling him that he need to increase his fluids intake and stay well hydrated. The patient was understanding and agreeable.

## 2018-08-18 ENCOUNTER — Telehealth: Payer: Self-pay | Admitting: Pharmacist

## 2018-08-18 NOTE — Telephone Encounter (Signed)
Oral Chemotherapy Pharmacist Encounter  Patient Education I spoke with patient for overview of new oral chemotherapy medication: Lenvima (lenvatinib) for the treatment of unresectable hepatocellular carcinoma, planned duration until disease progression or unacceptable drug toxicity.   Pt is doing well. Counseled patient on administration, dosing, side effects, monitoring, drug-food interactions, safe handling, storage, and disposal. Patient will take 2 capsules (8 mg total) by mouth daily.  Side effects include but not limited to: diarrhea, hand-foot syndrome, N/V, fatigue, HTN.    Reviewed with patient importance of keeping a medication schedule and plan for any missed doses.  Mr. Seelman voiced understanding and appreciation.  All questions answered. Medication handout placed in the mail.  Provided patient with Oral [facility] phone number. Patient knows to call the office with questions or concerns. Oral Chemotherapy Navigation Clinic will continue to follow.  Darl Pikes, PharmD, BCPS, [facility] Hematology/Oncology Clinical Pharmacist [facility]/AP Oral [facility] [phone]  08/18/2018 11:09 AM

## 2018-08-19 ENCOUNTER — Other Ambulatory Visit: Payer: Self-pay | Admitting: Hematology and Oncology

## 2018-08-19 ENCOUNTER — Inpatient Hospital Stay: Payer: Managed Care, Other (non HMO)

## 2018-08-19 ENCOUNTER — Other Ambulatory Visit: Payer: Self-pay

## 2018-08-19 DIAGNOSIS — C220 Liver cell carcinoma: Secondary | ICD-10-CM | POA: Diagnosis not present

## 2018-08-19 LAB — BASIC METABOLIC PANEL
Anion gap: 6 (ref 5–15)
BUN: 11 mg/dL (ref 6–20)
CO2: 26 mmol/L (ref 22–32)
Calcium: 10.1 mg/dL (ref 8.9–10.3)
Chloride: 101 mmol/L (ref 98–111)
Creatinine, Ser: 0.81 mg/dL (ref 0.61–1.24)
GFR calc Af Amer: >60 mL/min (ref >60)
GFR calc non Af Amer: >60 mL/min (ref >60)
Glucose, Bld: 106 mg/dL — ABNORMAL HIGH (ref 70–99)
Potassium: 3.8 mmol/L (ref 3.5–5.1)
Sodium: 133 mmol/L — ABNORMAL LOW (ref 135–145)

## 2018-08-19 LAB — ALBUMIN: Albumin: 3.7 g/dL (ref 3.5–5.0)

## 2018-08-19 LAB — AFP TUMOR MARKER: AFP, Serum, Tumor Marker: 6250 ng/mL — ABNORMAL HIGH (ref 0.0–8.3)

## 2018-08-19 NOTE — Progress Notes (Signed)
Patient's labwork revealed that his calcium level was within normal limits.  Patient instructed to continue drinking fluids for hydration and to return next Wednesday for labs.  Patient verbalized understanding.

## 2018-08-20 LAB — PARATHYROID HORMONE, INTACT (NO CA): PTH: 7 pg/mL — ABNORMAL LOW (ref 15–65)

## 2018-08-24 ENCOUNTER — Telehealth: Payer: Self-pay | Admitting: Hematology and Oncology

## 2018-08-24 LAB — PTH-RELATED PEPTIDE: PTH-related peptide: <2 pmol/L

## 2018-08-24 NOTE — Telephone Encounter (Signed)
Re:  Lenvatinib  Patient noted that he plans to begin lenvatinib tomorrow.   Lequita Asal, MD

## 2018-08-26 ENCOUNTER — Telehealth: Payer: Self-pay

## 2018-08-26 ENCOUNTER — Inpatient Hospital Stay: Payer: Managed Care, Other (non HMO) | Attending: Hematology and Oncology

## 2018-08-26 ENCOUNTER — Other Ambulatory Visit: Payer: Self-pay | Admitting: Hematology and Oncology

## 2018-08-26 ENCOUNTER — Inpatient Hospital Stay: Payer: Managed Care, Other (non HMO)

## 2018-08-26 ENCOUNTER — Other Ambulatory Visit: Payer: Self-pay

## 2018-08-26 DIAGNOSIS — Z8673 Personal history of transient ischemic attack (TIA), and cerebral infarction without residual deficits: Secondary | ICD-10-CM | POA: Insufficient documentation

## 2018-08-26 DIAGNOSIS — C220 Liver cell carcinoma: Secondary | ICD-10-CM | POA: Insufficient documentation

## 2018-08-26 DIAGNOSIS — I10 Essential (primary) hypertension: Secondary | ICD-10-CM | POA: Diagnosis not present

## 2018-08-26 DIAGNOSIS — E876 Hypokalemia: Secondary | ICD-10-CM

## 2018-08-26 DIAGNOSIS — Z7982 Long term (current) use of aspirin: Secondary | ICD-10-CM | POA: Diagnosis not present

## 2018-08-26 DIAGNOSIS — F17210 Nicotine dependence, cigarettes, uncomplicated: Secondary | ICD-10-CM | POA: Diagnosis not present

## 2018-08-26 DIAGNOSIS — Z79899 Other long term (current) drug therapy: Secondary | ICD-10-CM | POA: Diagnosis not present

## 2018-08-26 DIAGNOSIS — R918 Other nonspecific abnormal finding of lung field: Secondary | ICD-10-CM | POA: Insufficient documentation

## 2018-08-26 LAB — CBC WITH DIFFERENTIAL/PLATELET
Abs Immature Granulocytes: 0.02 10*3/uL (ref 0.00–0.07)
Basophils Absolute: 0 10*3/uL (ref 0.0–0.1)
Basophils Relative: 1 %
Eosinophils Absolute: 0.1 10*3/uL (ref 0.0–0.5)
Eosinophils Relative: 2 %
HCT: 46.1 % (ref 39.0–52.0)
Hemoglobin: 15.3 g/dL (ref 13.0–17.0)
Immature Granulocytes: 0 %
Lymphocytes Relative: 27 %
Lymphs Abs: 1.6 10*3/uL (ref 0.7–4.0)
MCH: 29.8 pg (ref 26.0–34.0)
MCHC: 33.2 g/dL (ref 30.0–36.0)
MCV: 89.7 fL (ref 80.0–100.0)
Monocytes Absolute: 0.2 10*3/uL (ref 0.1–1.0)
Monocytes Relative: 4 %
Neutro Abs: 3.9 10*3/uL (ref 1.7–7.7)
Neutrophils Relative %: 66 %
Platelets: 192 10*3/uL (ref 150–400)
RBC: 5.14 MIL/uL (ref 4.22–5.81)
RDW: 14.3 % (ref 11.5–15.5)
WBC: 5.8 10*3/uL (ref 4.0–10.5)
nRBC: 0 % (ref 0.0–0.2)

## 2018-08-26 LAB — COMPREHENSIVE METABOLIC PANEL
ALT: 44 U/L (ref 0–44)
AST: 53 U/L — ABNORMAL HIGH (ref 15–41)
Albumin: 4 g/dL (ref 3.5–5.0)
Alkaline Phosphatase: 213 U/L — ABNORMAL HIGH (ref 38–126)
Anion gap: 8 (ref 5–15)
BUN: 9 mg/dL (ref 6–20)
CO2: 25 mmol/L (ref 22–32)
Calcium: 9.9 mg/dL (ref 8.9–10.3)
Chloride: 96 mmol/L — ABNORMAL LOW (ref 98–111)
Creatinine, Ser: 0.77 mg/dL (ref 0.61–1.24)
GFR calc Af Amer: >60 mL/min (ref >60)
GFR calc non Af Amer: >60 mL/min (ref >60)
Glucose, Bld: 137 mg/dL — ABNORMAL HIGH (ref 70–99)
Potassium: 3.2 mmol/L — ABNORMAL LOW (ref 3.5–5.1)
Sodium: 129 mmol/L — ABNORMAL LOW (ref 135–145)
Total Bilirubin: 0.4 mg/dL (ref 0.3–1.2)
Total Protein: 7.8 g/dL (ref 6.5–8.1)

## 2018-08-26 MED ORDER — POTASSIUM CHLORIDE CRYS ER 20 MEQ PO TBCR: 20.0000 meq | EXTENDED_RELEASE_TABLET | Freq: Every day | ORAL | 0 refills | Status: DC

## 2018-08-26 NOTE — Telephone Encounter (Signed)
Informed patient of low sodium and potassium levels. Educated on importance of adding electrolyte rich fluids and salt to diet.   Patient reports he does not have Oral Potassium available. Advised Dr. Mike Gip would send in a prescription for this to his pharmacy. Patient verbalizes understanding and denies any concerns at this time.

## 2018-08-26 NOTE — Telephone Encounter (Signed)
Left VM informing patient Potassium was called into pharmacy. Advised he is to only take 1 tablet daily x 4 days and to hold the rest of the pills for possible future use, if it is needed. Number provided should any questions arise.

## 2018-08-26 NOTE — Telephone Encounter (Signed)
-----   Message from Lequita Asal, MD sent at 08/26/2018 10:53 AM EDT ----- Regarding: Please call patient  Sodium low.  Ensure drinking fluids with electrolytes and added salt.  Potassium is low.  He is on a diuretic.  Does he have any potassium?  If not, I will send in.  M ----- Message ----- From: Buel Ream, [facility] [place] Sent: 08/26/2018   8:33 AM EDT To: Lequita Asal, MD

## 2018-08-26 NOTE — Progress Notes (Signed)
Calcium 9.9 so Zometa not necessary today.  Patient informed.

## 2018-08-31 ENCOUNTER — Other Ambulatory Visit: Payer: Self-pay

## 2018-08-31 ENCOUNTER — Ambulatory Visit: Payer: Medicare Other | Admitting: Hematology and Oncology

## 2018-08-31 ENCOUNTER — Inpatient Hospital Stay: Payer: Managed Care, Other (non HMO)

## 2018-08-31 DIAGNOSIS — C220 Liver cell carcinoma: Secondary | ICD-10-CM | POA: Diagnosis not present

## 2018-08-31 LAB — BASIC METABOLIC PANEL
Anion gap: 10 (ref 5–15)
BUN: 9 mg/dL (ref 6–20)
CO2: 23 mmol/L (ref 22–32)
Calcium: 9.4 mg/dL (ref 8.9–10.3)
Chloride: 99 mmol/L (ref 98–111)
Creatinine, Ser: 0.76 mg/dL (ref 0.61–1.24)
GFR calc Af Amer: >60 mL/min (ref >60)
GFR calc non Af Amer: >60 mL/min (ref >60)
Glucose, Bld: 92 mg/dL (ref 70–99)
Potassium: 3.5 mmol/L (ref 3.5–5.1)
Sodium: 132 mmol/L — ABNORMAL LOW (ref 135–145)

## 2018-08-31 NOTE — Progress Notes (Addendum)
[facility]  [address], [address] [place], [place] [address] Phone: [phone]  Fax: [phone]   Telephone Office Visit:  09/01/2018  Referring physician: Donnie Coffin, MD  I connected with Linden Dolin on 09/01/2018 at 11:05 AM by telephone and verified that I was speaking with the correct person using 2 identifiers.  The patient was at home.  I discussed the limitations, risk, security and privacy concerns of performing an evaluation and management service by telephone and the availability of in person appointments.  I also discussed with the patient that there may be a patient responsible charge related to this service.  The patient expressed understanding and agreed to proceed.   Chief Complaint: Chris Mcdonald is a 63 y.o. male with a history of hepatitis C and presumed hepatocellular carcinoma who is seen for assessment on lenvatinib.  HPI: The patient was last seen in the oncology clinic on 08/17/2018. At that time, he had intermittent sharp right upper abdominal pain. Exam revealed a palpable liver. He had gained 8 pounds. We discussed radioembolization versus sytemic therapy with lenvatinib.  Decision was made to proceed with lenvatinib. Baseline EKG was performed. Calcium was 11.  He underwent a work-up for hypercalemia.  PTH was low.  PTH-rp was negative.  He was preauthorized for Zometa.  Repeat calcium was 9.9.  Zometa was held.  He began lenvatinib on 08/25/2018, two 4mg  tablets (8 mg total) daily.   CBC on 08/26/2018 was WNL. CMP showed: sodium 129, calcium 9.4, potassium 3.2, AST 53, ALT 44, alkaline phosphatase 213. He began a potassium supplement at this time.   During the interim, the patient reports "I feel pretty good."  He denies any side effects of levatinib.   He is tolerating pain well, but reports "my whole body hurts."  This has been present for several years. He took a 1 month course of pain medication in 05/2018 with no  improvement.  He is eating well. His clothes fit the same. He is active with his 69yo son.   He completed 4 days of potassium. He has been drinking Kool-Aid, fruit punch. He has not been adding salt to his food.    Past Medical History:  Diagnosis Date   Arthritis    Hepatitis    Hypertension    Spinal stenosis    Stroke [facility])     Past Surgical History:  Procedure Laterality Date   COLONOSCOPY WITH PROPOFOL N/A 02/21/2015   Procedure: COLONOSCOPY WITH PROPOFOL;  Surgeon: Lollie Sails, MD;  Location: [facility] ENDOSCOPY;  Service: Endoscopy;  Laterality: N/A;   INGUINAL HERNIA REPAIR Bilateral    IR RADIOLOGIST EVAL & MGMT  08/04/2018   POSTERIOR CERVICAL VERTEBRAE EXCISION      Family History  Problem Relation Age of Onset   Cancer Brother    Heart disease Brother     Social History:  reports that he has been smoking cigarettes. He has a 75.00 pack-year smoking history. He has never used smokeless tobacco. He reports that he does not drink alcohol or use drugs. He lives in [place] with his wife and daughter. He has a 50 year old daughter, 8 year old son, and 12 year old son. The patient is alone today.  Participants in the patient's visit and their role in the encounter included the patient and Vito Berger, CMA, today. The intake visit was provided by Vito Berger, CMA.  Allergies: No Known Allergies  Current Medications: Current Outpatient Medications  Medication Sig  Dispense Refill   aspirin EC 81 MG tablet Take 81 mg by mouth daily.      hydrochlorothiazide (HYDRODIURIL) 12.5 MG tablet Take 12.5 mg by mouth daily.     potassium chloride SA (K-DUR) 20 MEQ tablet Take 1 tablet (20 mEq total) by mouth daily. for 4 days. 20 tablet 0   lenvatinib 8 mg daily dose (LENVIMA, 8 MG DAILY DOSE,) 2 x 4 MG capsule Take 2 capsules (8 mg total) by mouth daily. (Patient not taking: Reported on 08/17/2018) 60 capsule 0   No current facility-administered medications  for this visit.     Review of Systems  Constitutional: Negative.  Negative for chills, diaphoresis, fever, malaise/fatigue and weight loss.       "I feel pretty good."  HENT: Negative.  Negative for congestion, ear pain, nosebleeds, sinus pain and sore throat.   Eyes: Negative.  Negative for blurred vision, double vision, photophobia and pain.  Respiratory: Positive for shortness of breath (on exertion). Negative for cough, sputum production and wheezing.   Cardiovascular: Negative.  Negative for chest pain, palpitations, orthopnea, leg swelling and PND.  Gastrointestinal: Positive for abdominal pain (intermittent sharp). Negative for blood in stool, constipation, diarrhea, heartburn, melena, nausea and vomiting.       Eating well.  Genitourinary: Negative.  Negative for dysuria, frequency, hematuria and urgency.  Musculoskeletal: Positive for neck pain (steel rods in neck). Negative for back pain, joint pain and myalgias.       "My whole body hurts."  Skin: Negative.  Negative for itching and rash.  Neurological: Positive for weakness (generalized pain and weakness) and headaches (chronic). Negative for dizziness, sensory change, speech change and focal weakness.       S/p left sided CVA.  Endo/Heme/Allergies: Negative.  Does not bruise/bleed easily.  Psychiatric/Behavioral: Negative.  Negative for depression and memory loss. The patient is not nervous/anxious and does not have insomnia.   All other systems reviewed and are negative.   Performance status (ECOG):  1-2   Appointment on 08/31/2018  Component Date Value Ref Range Status   Sodium 08/31/2018 132* 135 - 145 mmol/L Final   Potassium 08/31/2018 3.5  3.5 - 5.1 mmol/L Final   Chloride 08/31/2018 99  98 - 111 mmol/L Final   CO2 08/31/2018 23  22 - 32 mmol/L Final   Glucose, Bld 08/31/2018 92  70 - 99 mg/dL Final   BUN 08/31/2018 9  6 - 20 mg/dL Final   Creatinine, Ser 08/31/2018 0.76  0.61 - 1.24 mg/dL Final   Calcium  08/31/2018 9.4  8.9 - 10.3 mg/dL Final   GFR calc non Af Amer 08/31/2018 >60  >60 mL/min Final   GFR calc Af Amer 08/31/2018 >60  >60 mL/min Final   Anion gap 08/31/2018 10  5 - 15 Final   Performed at [facility], [address], [place], [place] [address]    Assessment:  Lebert Lovern is a 63 y.o. male with unresectable hepatocellular carcinoma.  He has a history of hepatitis C(treated with Harvoni in 04/11/2015).  He has hepatitis B.  Mass was discovered incidentally on low dose chest CT screenig program.  AFP was 7329.  Child-Pugh Class is A (score 5).  Low dose chest CT on 06/18/2018 revealed Lung-RADS 4A-S, suspicious.  There was a new centrilobular nodularity in both lungs, more likely inflammatory. Follow up low-dose chest CT without contrast in 3 months was recommended.  Three was a 8.5 x 5.7 cm poorly marginated geographic  region of low attenuation in the superior right liver, new since 06/17/2017.   Abdomen MRI on 07/10/2018 revealed an 8.8 cm heterogeneous infiltrating segment 8.  Findings were consistent with a hepatoma, given the underlying cirrhotic changes involving the liver. This was also invading and obstructing the middle and right portal veins and the middle hepatic vein.  There were scattered abdominal lymph nodes but no definite findings for metastatic disease.  AFP has been followed: 7329 on 07/27/2018 and 6250 on 08/17/2018.  He began lenvatinib on 08/25/2018.  He has a history of hypercalcemia.  Calcium was 11.0 on 08/17/2018.  PTH and PTH-rp were low on 08/19/2018.   Symptomatically, he denies any new complaints.  He is tolerating lenvatinib.  Sodium is 132.  Calcium is 9.4.  Plan: 1.   Review labs from 08/31/2018. 2.   Hepatocellular carcinoma             Discus interval initiation of lenvatinib.  Patient is tolerating well.             Review treatment is palliative.             Review  plan for lenvatinib for 2-3 months followed by  reimaging .    Review plan for Y-90 if progressive disease noted on lenvatinib.             Continue to monitor.     3.   Lung nodularity             Etiology unclear.             Schedule chest CT on 09/16/2018. 4.   RTC on 09/16/2018 for CMP. 5.   RTC on 09/17/2018 for MD assessment, review of labs and chest CT.    I discussed the assessment and treatment plan with the patient.  The patient was provided an opportunity to ask questions and all were answered.  The patient agreed with the plan and demonstrated an understanding of the instructions.  The patient was advised to call back or seek an in person evaluation if the symptoms worsen or if the condition fails to improve as anticipated.  I provided 15 minutes (11:05 AM - 11:05AM) of telephone visit time during this this encounter and > 50% was spent counseling as documented under my assessment and plan.  I provided these services from the [facility] office.   Nolon Stalls, MD, PhD  09/01/2018, 11:05 AM  I, Molly Dorshimer, am acting as Education administrator for [employer]. Mike Gip, MD, PhD.  I, Melissa C. Mike Gip, MD, have reviewed the above documentation for accuracy and completeness, and I agree with the above.

## 2018-09-01 ENCOUNTER — Other Ambulatory Visit: Payer: Self-pay

## 2018-09-01 ENCOUNTER — Encounter: Payer: Self-pay | Admitting: Hematology and Oncology

## 2018-09-01 ENCOUNTER — Inpatient Hospital Stay (HOSPITAL_BASED_OUTPATIENT_CLINIC_OR_DEPARTMENT_OTHER): Payer: Managed Care, Other (non HMO) | Admitting: Hematology and Oncology

## 2018-09-01 DIAGNOSIS — C220 Liver cell carcinoma: Secondary | ICD-10-CM

## 2018-09-01 DIAGNOSIS — R918 Other nonspecific abnormal finding of lung field: Secondary | ICD-10-CM

## 2018-09-01 NOTE — Progress Notes (Signed)
The patient c/o whole body pain ( pain level 8). The patient Name, DOB and address has been verified by phone.

## 2018-09-04 ENCOUNTER — Encounter: Payer: Self-pay | Admitting: Hematology and Oncology

## 2018-09-11 ENCOUNTER — Other Ambulatory Visit: Payer: Self-pay | Admitting: Hematology and Oncology

## 2018-09-11 DIAGNOSIS — Z87891 Personal history of nicotine dependence: Secondary | ICD-10-CM

## 2018-09-11 DIAGNOSIS — R918 Other nonspecific abnormal finding of lung field: Secondary | ICD-10-CM

## 2018-09-16 ENCOUNTER — Other Ambulatory Visit: Payer: Self-pay

## 2018-09-16 ENCOUNTER — Ambulatory Visit
Admission: RE | Admit: 2018-09-16 | Discharge: 2018-09-16 | Disposition: A | Discharge status: Home / Self Care | Payer: Managed Care, Other (non HMO) | Source: Ambulatory Visit | Attending: Hematology and Oncology | Admitting: Hematology and Oncology

## 2018-09-16 ENCOUNTER — Inpatient Hospital Stay: Payer: Managed Care, Other (non HMO)

## 2018-09-16 DIAGNOSIS — Z87891 Personal history of nicotine dependence: Secondary | ICD-10-CM | POA: Insufficient documentation

## 2018-09-16 DIAGNOSIS — R918 Other nonspecific abnormal finding of lung field: Secondary | ICD-10-CM | POA: Diagnosis present

## 2018-09-16 DIAGNOSIS — C220 Liver cell carcinoma: Secondary | ICD-10-CM | POA: Diagnosis not present

## 2018-09-16 LAB — COMPREHENSIVE METABOLIC PANEL
ALT: 45 U/L — ABNORMAL HIGH (ref 0–44)
AST: 52 U/L — ABNORMAL HIGH (ref 15–41)
Albumin: 3.8 g/dL (ref 3.5–5.0)
Alkaline Phosphatase: 172 U/L — ABNORMAL HIGH (ref 38–126)
Anion gap: 10 (ref 5–15)
BUN: 11 mg/dL (ref 6–20)
CO2: 25 mmol/L (ref 22–32)
Calcium: 8.9 mg/dL (ref 8.9–10.3)
Chloride: 95 mmol/L — ABNORMAL LOW (ref 98–111)
Creatinine, Ser: 0.85 mg/dL (ref 0.61–1.24)
GFR calc Af Amer: >60 mL/min (ref >60)
GFR calc non Af Amer: >60 mL/min (ref >60)
Glucose, Bld: 75 mg/dL (ref 70–99)
Potassium: 3.4 mmol/L — ABNORMAL LOW (ref 3.5–5.1)
Sodium: 130 mmol/L — ABNORMAL LOW (ref 135–145)
Total Bilirubin: 0.8 mg/dL (ref 0.3–1.2)
Total Protein: 7.6 g/dL (ref 6.5–8.1)

## 2018-09-16 NOTE — Progress Notes (Signed)
[facility]  [address], [address] [place], [place] [address] Phone: [phone]  Fax: [phone]   Telemedicine Office Visit:  09/17/2018  Referring physician: Donnie Coffin, MD  I connected with Chris Mcdonald on 09/17/2018 at 12:20 PM by telephone and verified that I was speaking with the correct person using 2 identifiers.  The patient was at home.  I discussed the limitations, risk, security and privacy concerns of performing an evaluation and management service by telephone and the availability of in person appointments.  I also discussed with the patient that there may be a patient responsible charge related to this service.  The patient expressed understanding and agreed to proceed.   Chief Complaint: Chris Mcdonald is a 63 y.o. male  with a history of hepatitis C and presumed hepatocellular carcinoma who is seen for assessment on lenvatinib.  HPI: The patient was last seen in the oncology clinic via telemedicine on 09/01/2018. At that time, he denied any new complaints.  He was tolerating lenvatinib.  Sodium was 132.  Calcium was 9.4.  We discussed follow-up chest CT for lung nodularity noted in 05/2018.  Chest CT on 09/16/2018 revealed a Lung-RADS score of 2, benign appearance or behavior. There was an ill-defined liver mass felt to represent a hepatoma on 07/10/2018, in the setting of cirrhosis. There was a possible punctate right renal stone. Aortic atherosclerosis and emphysema.   During the interim, the patient reports "I'm doing pretty good." He is tolerating levatinib well. He has occasional headaches and lightheadedness after taking the medication.   He is eating well and hydrating. He denies any nausea or diarrhea. He has occasional abdominal pain. He does not currently take a potassium supplement.   He reports his blood pressure has been a little elevated, at last reading it was 466 systolic.   Past Medical History:  Diagnosis Date    Arthritis    Hepatitis    Hypertension    Spinal stenosis    Stroke [facility])     Past Surgical History:  Procedure Laterality Date   COLONOSCOPY WITH PROPOFOL N/A 02/21/2015   Procedure: COLONOSCOPY WITH PROPOFOL;  Surgeon: Lollie Sails, MD;  Location: [facility] ENDOSCOPY;  Service: Endoscopy;  Laterality: N/A;   INGUINAL HERNIA REPAIR Bilateral    IR RADIOLOGIST EVAL & MGMT  08/04/2018   POSTERIOR CERVICAL VERTEBRAE EXCISION      Family History  Problem Relation Age of Onset   Cancer Brother    Heart disease Brother     Social History:  reports that he has been smoking cigarettes. He has a 75.00 pack-year smoking history. He has never used smokeless tobacco. He reports that he does not drink alcohol or use drugs. He lives in [place] with his wife and daughter.He has a 50 year old daughter, 24 year old son, and 30 year old son. The patient is accompanied by his wife today.  Participants in the patient's visit and their role in the encounter included the patient, his wife, and Vito Berger, CMA, today.  The intake visit was provided by Vito Berger, CMA.  Allergies: No Known Allergies  Current Medications: Current Outpatient Medications  Medication Sig Dispense Refill   aspirin EC 81 MG tablet Take 81 mg by mouth daily.      hydrochlorothiazide (HYDRODIURIL) 12.5 MG tablet Take 12.5 mg by mouth daily.     lenvatinib 8 mg daily dose (LENVIMA, 8 MG DAILY DOSE,) 2 x 4 MG capsule Take 2 capsules (8 mg  total) by mouth daily. 60 capsule 0   potassium chloride SA (K-DUR) 20 MEQ tablet Take 1 tablet (20 mEq total) by mouth daily. for 4 days. (Patient not taking: Reported on 09/17/2018) 20 tablet 0   No current facility-administered medications for this visit.     Review of Systems  Constitutional: Negative.  Negative for chills, diaphoresis, fever, malaise/fatigue and weight loss.       "I doing pretty good."  HENT: Negative.  Negative for congestion, ear pain,  nosebleeds, sinus pain and sore throat.   Eyes: Negative.  Negative for blurred vision, double vision, photophobia and pain.  Respiratory: Positive for shortness of breath (on exertion). Negative for cough, sputum production and wheezing.   Cardiovascular: Negative for chest pain, palpitations, orthopnea, leg swelling and PND.       Blood pressure a little elevated.  Gastrointestinal: Positive for abdominal pain (intermittent sharp). Negative for blood in stool, constipation, diarrhea, heartburn, melena, nausea and vomiting.       Eating well.  Genitourinary: Negative.  Negative for dysuria, frequency, hematuria and urgency.  Musculoskeletal: Positive for neck pain (steel rods in neck). Negative for back pain, joint pain and myalgias.  Skin: Negative.  Negative for itching and rash.  Neurological: Positive for dizziness (after taking levatinib), weakness (generalized pain and weakness) and headaches (chronic, occasional). Negative for sensory change, speech change and focal weakness.       S/p left sided CVA.  Endo/Heme/Allergies: Negative.  Does not bruise/bleed easily.  Psychiatric/Behavioral: Negative.  Negative for depression and memory loss. The patient is not nervous/anxious and does not have insomnia.   All other systems reviewed and are negative.   Performance status (ECOG): 1-2   Appointment on 09/16/2018  Component Date Value Ref Range Status   Sodium 09/16/2018 130* 135 - 145 mmol/L Final   Potassium 09/16/2018 3.4* 3.5 - 5.1 mmol/L Final   Chloride 09/16/2018 95* 98 - 111 mmol/L Final   CO2 09/16/2018 25  22 - 32 mmol/L Final   Glucose, Bld 09/16/2018 75  70 - 99 mg/dL Final   BUN 09/16/2018 11  6 - 20 mg/dL Final   Creatinine, Ser 09/16/2018 0.85  0.61 - 1.24 mg/dL Final   Calcium 09/16/2018 8.9  8.9 - 10.3 mg/dL Final   Total Protein 09/16/2018 7.6  6.5 - 8.1 g/dL Final   Albumin 09/16/2018 3.8  3.5 - 5.0 g/dL Final   AST 09/16/2018 52* 15 - 41 U/L Final    ALT 09/16/2018 45* 0 - 44 U/L Final   Alkaline Phosphatase 09/16/2018 172* 38 - 126 U/L Final   Total Bilirubin 09/16/2018 0.8  0.3 - 1.2 mg/dL Final   GFR calc non Af Amer 09/16/2018 >60  >60 mL/min Final   GFR calc Af Amer 09/16/2018 >60  >60 mL/min Final   Anion gap 09/16/2018 10  5 - 15 Final   Performed at [facility], [address], [place], [place] [address]    Assessment:  Chris Mcdonald is a 63 y.o. male with unresectable hepatocellular carcinoma. He has a history of hepatitis C(treated with Harvoni in 04/11/2015). He has hepatitis B. Mass was discovered incidentally on low dose chest CT screenig program. AFPwas 7329. Child-Pugh ClassisA (score 5).  Low dose chest CTon 06/18/2018 revealed Lung-RADS 4A-S, suspicious. There was a new centrilobular nodularity in both lungs, more likely inflammatory.  Three was a 8.5 x 5.7 cm poorly marginated geographic region of low attenuation in the superior right liver, new since  06/17/2017.  Chest CT on 09/16/2018 revealed a Lung-RADS score of 2, benign appearance or behavior. There was an ill-defined liver mass felt to represent a hepatoma on 07/10/2018, in the setting of cirrhosis.   Abdomen MRI on 07/10/2018 revealed an 8.8 cm heterogeneous infiltrating segment 8. Findings were consistent with a hepatoma, given the underlying cirrhotic changes involving the liver. This was also invading and obstructing the middle and right portal veins and the middle hepatic vein. There were scattered abdominal lymph nodes but no definite findings for metastatic disease.  AFP has been followed: 7329 on 07/27/2018 and 6250 on 08/17/2018.  He began lenvatinib on 08/25/2018.  He has a history of hypercalcemia.  Calcium was 11.0 on 08/17/2018.  PTH and PTH-rp were low on 08/19/2018.   Symptomatically, he is doing well.  He denies any complaints.  Blood pressure has been slightly elevated.  Calcium is 8.9.  Plan: 1.   Review labs  from 09/16/2018. 2. Hepatocellular carcinoma Clinically, he is doing well.  He is tolerating his lenvatinib.  He may have some associated hypertension. Review plan for lenvatinib for 2-3 months followed by reimaging .               Review plan for Y-90 if progressive disease noted on lenvatinib. Continue close assessment. 3. Lung nodularity Review interval chest CT.  Images personally reviewed.  Agree with radiology interpretation. No evidence of metastatic disease to chest. 4.   Hypertension  Pateint has baseline hypertension on HCTZ.  Blood pressure tends to be trending up on lenvatinib.  Discuss follow-up with Dr. Clide Deutscher. 5.   Electrolyte issues  Sodium 130.  Potassium 3.4.  Patient on HCTZ.  Discuss fluids with electrolytes.  Patient had been on potassium supplementation.  Discuss taking potassium 20 meq po q day x 2 days. 6.   RTC in 2 weeks for MD assessment and labs (CBC with diff, CMP)  I discussed the assessment and treatment plan with the patient.  The patient was provided an opportunity to ask questions and all were answered.  The patient agreed with the plan and demonstrated an understanding of the instructions.  The patient was advised to call back or seek an in person evaluation if the symptoms worsen or if the condition fails to improve as anticipated.  I provided 11 minutes (12:20 PM - 12:30 PM) of telephone time during this this encounter and > 50% was spent counseling as documented under my assessment and plan.  I provided these services from the [facility] office.   Nolon Stalls, MD, PhD  09/17/2018, 12:20 PM  I, Molly Dorshimer, am acting as Education administrator for [employer]. Mike Gip, MD, PhD.  I, Starlina Lapre C. Mike Gip, MD, have reviewed the above documentation for accuracy and completeness, and I agree with the above.

## 2018-09-17 ENCOUNTER — Encounter: Payer: Self-pay | Admitting: Hematology and Oncology

## 2018-09-17 ENCOUNTER — Inpatient Hospital Stay (HOSPITAL_BASED_OUTPATIENT_CLINIC_OR_DEPARTMENT_OTHER): Payer: Managed Care, Other (non HMO) | Admitting: Hematology and Oncology

## 2018-09-17 DIAGNOSIS — C220 Liver cell carcinoma: Secondary | ICD-10-CM | POA: Diagnosis not present

## 2018-09-17 DIAGNOSIS — Z7189 Other specified counseling: Secondary | ICD-10-CM

## 2018-09-17 DIAGNOSIS — E876 Hypokalemia: Secondary | ICD-10-CM | POA: Diagnosis not present

## 2018-09-17 DIAGNOSIS — E871 Hypo-osmolality and hyponatremia: Secondary | ICD-10-CM | POA: Diagnosis not present

## 2018-09-17 DIAGNOSIS — R918 Other nonspecific abnormal finding of lung field: Secondary | ICD-10-CM | POA: Diagnosis not present

## 2018-09-17 NOTE — Progress Notes (Signed)
The patient c/o whole body pain ( 7) . The patient name DOB and address has been verified by phone today.

## 2018-09-18 ENCOUNTER — Telehealth: Payer: Self-pay

## 2018-09-18 NOTE — Telephone Encounter (Signed)
Spoke with receptionist, [facility], and informed her patient needs to be seen regarding HTN. Appt. Scheduled for 09/21/2018 at 1:00 PM.   Contacted patient and informed him of appt.time at [facility] and he verbalizes understanding.

## 2018-09-19 DIAGNOSIS — E876 Hypokalemia: Secondary | ICD-10-CM | POA: Insufficient documentation

## 2018-09-19 DIAGNOSIS — E871 Hypo-osmolality and hyponatremia: Secondary | ICD-10-CM | POA: Insufficient documentation

## 2018-09-24 ENCOUNTER — Telehealth: Payer: Self-pay

## 2018-09-24 ENCOUNTER — Other Ambulatory Visit: Payer: Self-pay

## 2018-09-24 DIAGNOSIS — C220 Liver cell carcinoma: Secondary | ICD-10-CM

## 2018-09-24 MED ORDER — LENVATINIB (8 MG DAILY DOSE) 2 X 4 MG PO CPPK: 8.0000 mg | ORAL_CAPSULE | Freq: Every day | ORAL | 0 refills | Status: DC

## 2018-09-24 NOTE — Telephone Encounter (Signed)
Medication refill on 09/24/2018 for lenvima 8 mg # 60 The patient was informed. The patient was understanding.

## 2018-09-29 ENCOUNTER — Other Ambulatory Visit: Payer: Self-pay

## 2018-09-30 ENCOUNTER — Inpatient Hospital Stay: Payer: Managed Care, Other (non HMO) | Attending: Hematology and Oncology

## 2018-09-30 DIAGNOSIS — C220 Liver cell carcinoma: Secondary | ICD-10-CM | POA: Insufficient documentation

## 2018-09-30 DIAGNOSIS — R918 Other nonspecific abnormal finding of lung field: Secondary | ICD-10-CM

## 2018-09-30 LAB — CBC WITH DIFFERENTIAL/PLATELET
Abs Immature Granulocytes: 0.01 10*3/uL (ref 0.00–0.07)
Basophils Absolute: 0.1 10*3/uL (ref 0.0–0.1)
Basophils Relative: 1 %
Eosinophils Absolute: 0.2 10*3/uL (ref 0.0–0.5)
Eosinophils Relative: 4 %
HCT: 50.6 % (ref 39.0–52.0)
Hemoglobin: 17.2 g/dL — ABNORMAL HIGH (ref 13.0–17.0)
Immature Granulocytes: 0 %
Lymphocytes Relative: 32 %
Lymphs Abs: 1.7 10*3/uL (ref 0.7–4.0)
MCH: 29.3 pg (ref 26.0–34.0)
MCHC: 34 g/dL (ref 30.0–36.0)
MCV: 86.2 fL (ref 80.0–100.0)
Monocytes Absolute: 0.3 10*3/uL (ref 0.1–1.0)
Monocytes Relative: 6 %
Neutro Abs: 3 10*3/uL (ref 1.7–7.7)
Neutrophils Relative %: 57 %
Platelets: 201 10*3/uL (ref 150–400)
RBC: 5.87 MIL/uL — ABNORMAL HIGH (ref 4.22–5.81)
RDW: 13.2 % (ref 11.5–15.5)
WBC: 5.4 10*3/uL (ref 4.0–10.5)
nRBC: 0 % (ref 0.0–0.2)

## 2018-09-30 LAB — COMPREHENSIVE METABOLIC PANEL
ALT: 31 U/L (ref 0–44)
AST: 45 U/L — ABNORMAL HIGH (ref 15–41)
Albumin: 3.8 g/dL (ref 3.5–5.0)
Alkaline Phosphatase: 166 U/L — ABNORMAL HIGH (ref 38–126)
Anion gap: 11 (ref 5–15)
BUN: 10 mg/dL (ref 6–20)
CO2: 27 mmol/L (ref 22–32)
Calcium: 9.2 mg/dL (ref 8.9–10.3)
Chloride: 93 mmol/L — ABNORMAL LOW (ref 98–111)
Creatinine, Ser: 0.78 mg/dL (ref 0.61–1.24)
GFR calc Af Amer: >60 mL/min (ref >60)
GFR calc non Af Amer: >60 mL/min (ref >60)
Glucose, Bld: 105 mg/dL — ABNORMAL HIGH (ref 70–99)
Potassium: 3.5 mmol/L (ref 3.5–5.1)
Sodium: 131 mmol/L — ABNORMAL LOW (ref 135–145)
Total Bilirubin: 0.6 mg/dL (ref 0.3–1.2)
Total Protein: 8 g/dL (ref 6.5–8.1)

## 2018-10-01 ENCOUNTER — Other Ambulatory Visit: Payer: Medicare Other

## 2018-10-01 ENCOUNTER — Encounter: Payer: Self-pay | Admitting: Hematology and Oncology

## 2018-10-01 ENCOUNTER — Inpatient Hospital Stay (HOSPITAL_BASED_OUTPATIENT_CLINIC_OR_DEPARTMENT_OTHER): Payer: Managed Care, Other (non HMO) | Admitting: Hematology and Oncology

## 2018-10-01 DIAGNOSIS — D751 Secondary polycythemia: Secondary | ICD-10-CM

## 2018-10-01 DIAGNOSIS — C220 Liver cell carcinoma: Secondary | ICD-10-CM

## 2018-10-01 DIAGNOSIS — E871 Hypo-osmolality and hyponatremia: Secondary | ICD-10-CM

## 2018-10-01 DIAGNOSIS — E876 Hypokalemia: Secondary | ICD-10-CM

## 2018-10-01 NOTE — Progress Notes (Signed)
Confirmed Name, DOB, and Address. Denies any concerns.  

## 2018-10-06 ENCOUNTER — Telehealth: Payer: Self-pay | Admitting: Hematology and Oncology

## 2018-10-06 NOTE — Progress Notes (Signed)
[facility]  [address] [place], [place] [address] Phone: [phone]  Fax: [phone]   Telemedicine Office Visit:  10/07/2018  Referring physician: Donnie Coffin, MD  I connected with Chris Mcdonald on 10/07/2018 at 12:04 PM by telephone and verified that I was speaking with the correct person using 2 identifiers.  The patient was at home.  I discussed the limitations, risk, security and privacy concerns of performing an evaluation and management service by telephone and the availability of in person appointments.  I also discussed with the patient that there may be a patient responsible charge related to this service.  The patient expressed understanding and agreed to proceed.   Chief Complaint: Chris Mcdonald is a 63 y.o. male with a history of hepatitis C and presumed hepatocellular carcinoma who is seenfor 3 week assessmentonlenvatinib.  HPI: The patient was last seen in the oncology clinic on 09/17/2018. At that time,  he was doing well.  He denied any complaints.  Blood pressure had been slightly elevated. Calcium was 8.9.  He was referred to the [facility] for follow-up of hypertension on 09/21/2018. About a week ago, his HCTZ was increased from 12.5mg  to 25mg  daily and he continued on amlodipine.   During the interim, the patient is doing "good." He has been drinking a lot of Gatorade. His weight fluctuates. He denies any fevers, sweats, or visual changes. He has occasional headaches. He has an occasional cough, shortness of breath with exertion, and sinus issues. He denies any abdominal pain.   His bp on his home machine was 619 systolic and has decreased to 509 systolic following his medication adjustment. He continues on levatinib.    Past Medical History:  Diagnosis Date  . Arthritis   . Hepatitis   . Hypertension   . Spinal stenosis   . Stroke [facility])     Past Surgical History:  Procedure Laterality Date  .  COLONOSCOPY WITH PROPOFOL N/A 02/21/2015   Procedure: COLONOSCOPY WITH PROPOFOL;  Surgeon: Lollie Sails, MD;  Location: [facility] ENDOSCOPY;  Service: Endoscopy;  Laterality: N/A;  . INGUINAL HERNIA REPAIR Bilateral   . IR RADIOLOGIST EVAL & MGMT  08/04/2018  . POSTERIOR CERVICAL VERTEBRAE EXCISION      Family History  Problem Relation Age of Onset  . Cancer Brother   . Heart disease Brother     Social History:  reports that he has been smoking cigarettes. He has a 75.00 pack-year smoking history. He has never used smokeless tobacco. He reports that he does not drink alcohol or use drugs. He lives in [place] with his wife and daughter.He has a 53 year old daughter, 84 year old son, and 76 year old son.The patient is alone today.  Participants in the patient's visit and their role in the encounter included the patient and Waymon Budge, RN today.  The intake visit was provided by Waymon Budge, RN.  Allergies: No Known Allergies  Current Medications: Current Outpatient Medications  Medication Sig Dispense Refill  . amLODipine (NORVASC) 5 MG tablet Take 5 mg by mouth daily.     Marland Kitchen aspirin EC 81 MG tablet Take 81 mg by mouth daily.     . hydrochlorothiazide (HYDRODIURIL) 12.5 MG tablet Take 25 mg by mouth daily.     Marland Kitchen lenvatinib 8 mg daily dose (LENVIMA, 8 MG DAILY DOSE,) 2 x 4 MG capsule Take 2 capsules (8 mg total) by mouth daily. 60 capsule 0  . potassium chloride  SA (K-DUR) 20 MEQ tablet Take 1 tablet (20 mEq total) by mouth daily. for 4 days. 20 tablet 0   No current facility-administered medications for this visit.     Review of Systems  Constitutional: Negative.  Negative for chills, diaphoresis, fever, malaise/fatigue and weight loss (fluctuates).       "I'm doing good."  HENT: Positive for sinus pain. Negative for congestion, ear pain, nosebleeds and sore throat.   Eyes: Negative.  Negative for blurred vision, double vision, photophobia and pain.  Respiratory:  Positive for cough (ocassional) and shortness of breath (on exertion). Negative for sputum production and wheezing.   Cardiovascular: Negative for chest pain, palpitations, orthopnea, leg swelling and PND.       Blood pressure a little elevated.  Gastrointestinal: Negative for abdominal pain, blood in stool, constipation, diarrhea, heartburn, melena, nausea and vomiting.       Eating well.  Genitourinary: Negative.  Negative for dysuria, frequency, hematuria and urgency.  Musculoskeletal: Positive for neck pain (steel rods in neck). Negative for back pain, joint pain and myalgias.  Skin: Negative.  Negative for itching and rash.  Neurological: Positive for weakness (generalized pain and weakness) and headaches (chronic, occasional). Negative for dizziness (after taking levatinib, resolved), sensory change, speech change and focal weakness.       S/p left sided CVA.  Endo/Heme/Allergies: Negative.  Does not bruise/bleed easily.  Psychiatric/Behavioral: Negative.  Negative for depression and memory loss. The patient is not nervous/anxious and does not have insomnia.   All other systems reviewed and are negative.   Performance status (ECOG): 1   No visits with results within 3 Day(s) from this visit.  Latest known visit with results is:  Appointment on 09/30/2018  Component Date Value Ref Range Status  . Sodium 09/30/2018 131* 135 - 145 mmol/L Final  . Potassium 09/30/2018 3.5  3.5 - 5.1 mmol/L Final  . Chloride 09/30/2018 93* 98 - 111 mmol/L Final  . CO2 09/30/2018 27  22 - 32 mmol/L Final  . Glucose, Bld 09/30/2018 105* 70 - 99 mg/dL Final  . BUN 09/30/2018 10  6 - 20 mg/dL Final  . Creatinine, Ser 09/30/2018 0.78  0.61 - 1.24 mg/dL Final  . Calcium 09/30/2018 9.2  8.9 - 10.3 mg/dL Final  . Total Protein 09/30/2018 8.0  6.5 - 8.1 g/dL Final  . Albumin 09/30/2018 3.8  3.5 - 5.0 g/dL Final  . AST 09/30/2018 45* 15 - 41 U/L Final  . ALT 09/30/2018 31  0 - 44 U/L Final  . Alkaline  Phosphatase 09/30/2018 166* 38 - 126 U/L Final  . Total Bilirubin 09/30/2018 0.6  0.3 - 1.2 mg/dL Final  . GFR calc non Af Amer 09/30/2018 >60  >60 mL/min Final  . GFR calc Af Amer 09/30/2018 >60  >60 mL/min Final  . Anion gap 09/30/2018 11  5 - 15 Final   Performed at [facility], [address] [place],  [address]  . WBC 09/30/2018 5.4  4.0 - 10.5 K/uL Final  . RBC 09/30/2018 5.87* 4.22 - 5.81 MIL/uL Final  . Hemoglobin 09/30/2018 17.2* 13.0 - 17.0 g/dL Final  . HCT 09/30/2018 50.6  39.0 - 52.0 % Final  . MCV 09/30/2018 86.2  80.0 - 100.0 fL Final  . MCH 09/30/2018 29.3  26.0 - 34.0 pg Final  . MCHC 09/30/2018 34.0  30.0 - 36.0 g/dL Final  . RDW 09/30/2018 13.2  11.5 - 15.5 % Final  . Platelets 09/30/2018 201  150 - 400 K/uL Final  . nRBC 09/30/2018 0.0  0.0 - 0.2 % Final  . Neutrophils Relative % 09/30/2018 57  % Final  . Neutro Abs 09/30/2018 3.0  1.7 - 7.7 K/uL Final  . Lymphocytes Relative 09/30/2018 32  % Final  . Lymphs Abs 09/30/2018 1.7  0.7 - 4.0 K/uL Final  . Monocytes Relative 09/30/2018 6  % Final  . Monocytes Absolute 09/30/2018 0.3  0.1 - 1.0 K/uL Final  . Eosinophils Relative 09/30/2018 4  % Final  . Eosinophils Absolute 09/30/2018 0.2  0.0 - 0.5 K/uL Final  . Basophils Relative 09/30/2018 1  % Final  . Basophils Absolute 09/30/2018 0.1  0.0 - 0.1 K/uL Final  . Immature Granulocytes 09/30/2018 0  % Final  . Abs Immature Granulocytes 09/30/2018 0.01  0.00 - 0.07 K/uL Final   Performed at [facility], [address] [place],  [address]    Assessment:  Chris Mcdonald is a 63 y.o. male with unresectable hepatocellular carcinoma. He has a history of hepatitis C(treated with Harvoni in 04/11/2015). He has hepatitis B. Mass was discovered incidentally on low dose chest CT screenig program. AFPwas 7329. Child-Pugh ClassisA (score 5).  Low dose chest CTon 06/18/2018 revealed Lung-RADS 4A-S, suspicious. There was a new  centrilobular nodularity in both lungs, more likely inflammatory.  Three was a 8.5 x 5.7 cm poorly marginated geographic region of low attenuation in the superior right liver, new since 06/17/2017.  Chest CT on 09/16/2018 revealed a Lung-RADS score of 2, benign appearance or behavior. There was an ill-defined liver mass felt to represent a hepatoma on 07/10/2018, in the setting of cirrhosis.   Abdomen MRI on 07/10/2018 revealed an 8.8 cm heterogeneous infiltrating segment 8. Findings were consistent with a hepatoma, given the underlying cirrhotic changes involving the liver. This was also invading and obstructing the middle and right portal veins and the middle hepatic vein. There were scattered abdominal lymph nodes but no definite findings for metastatic disease.  AFPhas been followed: 6045 on 07/27/2018 and 6250 on 08/17/2018.  He began lenvatinibon 08/25/2018.  He has a history of hypercalcemia. Calcium was 11.0 on 08/17/2018. PTH and PTH-rp were low on 08/19/2018.   Symptomatically, he is doing well.  His blood pressure has been slightly elevated necessitating adjustment in his antihypertensive regimen  Plan: 1.   Review labs from 09/30/2018. 2. Hepatocellular carcinoma Clinically he is doing well.    He is tolerating lenvatinib without significant side effect.    Blood pressure has been slightly elevated with recent adjustment in his HCTZ.   Discuss overall plan for lenvatinib for 2-3 months followed by reimaging.    Postpone treatment with Y 90 unless progressive disease on lenvatinib.   3.   Hypertension             Patient has baseline hypertension on HCTZ.  Hypertension is a side effect of lenvatinib.             Recent adjustment in HCTZ from 12.5 to 25 mg daily  Continue to monitor. 4.   Electrolyte issues             Sodium 131.  Potassium 3.5.    Patient to continue fluids with electrolytes.    Dissipate monitoring of potassium given increase HCTZ and  known electrolyte wasting. 5.   RTC in 2 weeks for MD assess (in person), labs (CBC with diff, CMP, AFP).  I discussed the assessment and treatment plan with  the patient.  The patient was provided an opportunity to ask questions and all were answered.  The patient agreed with the plan and demonstrated an understanding of the instructions.  The patient was advised to call back or seek an in person evaluation if the symptoms worsen or if the condition fails to improve as anticipated.  I provided 10 minutes (12:04 PM - 12:13 PM) of non face-to-face telephone visit time during this this encounter and > 50% was spent counseling as documented under my assessment and plan.  I provided these services from the [facility] office.   Nolon Stalls, MD, PhD  10/07/2018, 12:04 PM  I, Molly Dorshimer, am acting as Education administrator for [employer]. Mike Gip, MD, PhD.  I, Melissa C. Mike Gip, MD, have reviewed the above documentation for accuracy and completeness, and I agree with the above.

## 2018-10-07 ENCOUNTER — Inpatient Hospital Stay (HOSPITAL_BASED_OUTPATIENT_CLINIC_OR_DEPARTMENT_OTHER): Payer: Managed Care, Other (non HMO) | Admitting: Hematology and Oncology

## 2018-10-07 ENCOUNTER — Other Ambulatory Visit: Payer: Self-pay

## 2018-10-07 ENCOUNTER — Encounter: Payer: Self-pay | Admitting: Hematology and Oncology

## 2018-10-07 DIAGNOSIS — I10 Essential (primary) hypertension: Secondary | ICD-10-CM

## 2018-10-07 DIAGNOSIS — E871 Hypo-osmolality and hyponatremia: Secondary | ICD-10-CM

## 2018-10-07 DIAGNOSIS — C220 Liver cell carcinoma: Secondary | ICD-10-CM

## 2018-10-07 DIAGNOSIS — Z79899 Other long term (current) drug therapy: Secondary | ICD-10-CM

## 2018-10-14 ENCOUNTER — Other Ambulatory Visit: Payer: Self-pay

## 2018-10-14 DIAGNOSIS — C220 Liver cell carcinoma: Secondary | ICD-10-CM

## 2018-11-17 NOTE — Progress Notes (Signed)
[facility]  [address] [place], [place] [address] Phone: [phone]  Fax: [phone]   Clinic Day:  11/18/2018  Referring physician: Donnie Coffin, MD  Chief Complaint: Chris Mcdonald is a 63 y.o. male with a history of hepatitis C and presumed hepatocellular carcinoma who is seenfor 6 week assessmentonlenvatinib.  HPI: The patient was last seen in the medical oncology clinic via telephone on 10/07/2018. At that time, he was doing well.  His blood pressure was slightly elevated necessitating adjustment in his antihypertensive regimen.  During the interim, he is doing okay. He notes dysphagia and stomach pain when eating solid foods. He notes vomiting and gagging when he chews something too long. He has had some right sided abdominal pain. He can keep food down.  He denies any diarrhea. He denies any sinus pain.   He notes a mild cough and shortness of breath. He continues to have chronic neck pain. His weight is relatively stable at 121 lbs today, although he notes his baseline weight is 130lbs. He can perform all of his ADLs.   His blood pressure in the clinic today is 138/98, although he notes it was recently higher and has since come down. His medication was recently changed from 5mg  to 10mg  amlodipine and 12.5mg  to 25mg  HCTZ.    Past Medical History:  Diagnosis Date  . Arthritis   . Hepatitis   . Hypertension   . Spinal stenosis   . Stroke [facility])     Past Surgical History:  Procedure Laterality Date  . COLONOSCOPY WITH PROPOFOL N/A 02/21/2015   Procedure: COLONOSCOPY WITH PROPOFOL;  Surgeon: Lollie Sails, MD;  Location: [facility] ENDOSCOPY;  Service: Endoscopy;  Laterality: N/A;  . INGUINAL HERNIA REPAIR Bilateral   . IR RADIOLOGIST EVAL & MGMT  08/04/2018  . POSTERIOR CERVICAL VERTEBRAE EXCISION      Family History  Problem Relation Age of Onset  . Cancer Brother   . Heart disease Brother     Social History:  reports that  he has been smoking cigarettes. He has a 75.00 pack-year smoking history. He has never used smokeless tobacco. He reports that he does not drink alcohol or use drugs. He lives in [place] with his wife and daughter.He has a 66 year old daughter, 61 year old son, and 8 year old son.The patient isalonetoday.  Allergies: No Known Allergies  Current Medications: Current Outpatient Medications  Medication Sig Dispense Refill  . amLODipine (NORVASC) 5 MG tablet Take 10 mg by mouth daily.     Marland Kitchen aspirin EC 81 MG tablet Take 81 mg by mouth daily.     . hydrochlorothiazide (HYDRODIURIL) 12.5 MG tablet Take 25 mg by mouth daily.     Marland Kitchen lenvatinib 8 mg daily dose (LENVIMA, 8 MG DAILY DOSE,) 2 x 4 MG capsule Take 2 capsules (8 mg total) by mouth daily. 60 capsule 0  . potassium chloride SA (K-DUR) 20 MEQ tablet Take 1 tablet (20 mEq total) by mouth daily. for 4 days. 20 tablet 0   No current facility-administered medications for this visit.     Review of Systems  Constitutional: Negative for chills, diaphoresis, fever, malaise/fatigue and weight loss (fluctuates, 121 lb today).  HENT: Negative.  Negative for congestion, ear pain, hearing loss, nosebleeds, sinus pain and sore throat.   Eyes: Negative.  Negative for blurred vision, double vision, photophobia and pain.  Respiratory: Positive for cough (ocassional) and shortness of breath (on exertion). Negative for sputum  production and wheezing.   Cardiovascular: Negative for chest pain, palpitations, orthopnea, leg swelling and PND.       Blood pressure a little elevated.  Gastrointestinal: Positive for abdominal pain (right side) and vomiting (gagging when eating). Negative for blood in stool, constipation, diarrhea, heartburn, melena and nausea.       Eating well. Dysphagia.  Genitourinary: Negative.  Negative for dysuria, frequency, hematuria and urgency.  Musculoskeletal: Positive for neck pain (steel rods in neck). Negative for back pain, joint  pain and myalgias.  Skin: Negative.  Negative for itching and rash.  Neurological: Positive for weakness (generalized pain and weakness) and headaches (chronic, occasional). Negative for dizziness, sensory change, speech change and focal weakness.       S/p left sided CVA.  Endo/Heme/Allergies: Negative.  Does not bruise/bleed easily.  Psychiatric/Behavioral: Negative.  Negative for depression and memory loss. The patient is not nervous/anxious and does not have insomnia.   All other systems reviewed and are negative.  Performance status (ECOG): 1  Vitals Blood pressure (!) 138/98, pulse 65, temperature 98.4 F (36.9 C), temperature source Tympanic, resp. rate 18, height 5\' 7"  (1.702 m), weight 121 lb 4.1 oz (55 kg), SpO2 100 %.   Physical Exam  Constitutional: He is oriented to person, place, and time. He appears well-developed and well-nourished. No distress.  HENT:  Head: Normocephalic and atraumatic.  Mouth/Throat: Oropharynx is clear and moist. No oropharyngeal exudate.  Mask. Short, brown graying hair and beard.  Eyes: Pupils are equal, round, and reactive to light. Conjunctivae and EOM are normal. No scleral icterus.  Neck: Normal range of motion. Neck supple. No JVD present.  Cardiovascular: Normal rate, regular rhythm and normal heart sounds. Exam reveals no gallop.  No murmur heard. Pulmonary/Chest: Effort normal and breath sounds normal. No respiratory distress. He has no wheezes. He has no rales.  Abdominal: Soft. Bowel sounds are normal. He exhibits no distension and no mass. There is no splenomegaly or hepatomegaly. There is no abdominal tenderness. There is no rebound and no guarding.  Musculoskeletal: Normal range of motion.        General: No edema.  Lymphadenopathy:    He has no cervical adenopathy.    He has no axillary adenopathy.       Right: No supraclavicular adenopathy present.       Left: No supraclavicular adenopathy present.  Neurological: He is alert and  oriented to person, place, and time.  Skin: Skin is warm and dry. No rash noted. He is not diaphoretic. No erythema. No pallor.  Psychiatric: He has a normal mood and affect. His behavior is normal. Judgment and thought content normal.  Nursing note and vitals reviewed.   Appointment on 11/18/2018  Component Date Value Ref Range Status  . WBC 11/18/2018 5.2  4.0 - 10.5 K/uL Final  . RBC 11/18/2018 5.83* 4.22 - 5.81 MIL/uL Final  . Hemoglobin 11/18/2018 16.9  13.0 - 17.0 g/dL Final  . HCT 11/18/2018 48.9  39.0 - 52.0 % Final  . MCV 11/18/2018 83.9  80.0 - 100.0 fL Final  . MCH 11/18/2018 29.0  26.0 - 34.0 pg Final  . MCHC 11/18/2018 34.6  30.0 - 36.0 g/dL Final  . RDW 11/18/2018 13.3  11.5 - 15.5 % Final  . Platelets 11/18/2018 148* 150 - 400 K/uL Final  . nRBC 11/18/2018 0.0  0.0 - 0.2 % Final  . Neutrophils Relative % 11/18/2018 50  % Final  . Neutro Abs 11/18/2018 2.6  1.7 -  7.7 K/uL Final  . Lymphocytes Relative 11/18/2018 37  % Final  . Lymphs Abs 11/18/2018 2.0  0.7 - 4.0 K/uL Final  . Monocytes Relative 11/18/2018 7  % Final  . Monocytes Absolute 11/18/2018 0.4  0.1 - 1.0 K/uL Final  . Eosinophils Relative 11/18/2018 5  % Final  . Eosinophils Absolute 11/18/2018 0.3  0.0 - 0.5 K/uL Final  . Basophils Relative 11/18/2018 1  % Final  . Basophils Absolute 11/18/2018 0.1  0.0 - 0.1 K/uL Final  . Immature Granulocytes 11/18/2018 0  % Final  . Abs Immature Granulocytes 11/18/2018 0.01  0.00 - 0.07 K/uL Final   Performed at [facility], [address] [place], [place] [address]  . Sodium 11/18/2018 132* 135 - 145 mmol/L Final  . Potassium 11/18/2018 2.9* 3.5 - 5.1 mmol/L Final  . Chloride 11/18/2018 93* 98 - 111 mmol/L Final  . CO2 11/18/2018 28  22 - 32 mmol/L Final  . Glucose, Bld 11/18/2018 101* 70 - 99 mg/dL Final  . BUN 11/18/2018 14  6 - 20 mg/dL Final  . Creatinine, Ser 11/18/2018 0.90  0.61 - 1.24 mg/dL Final  . Calcium 11/18/2018 9.2  8.9 - 10.3 mg/dL  Final  . Total Protein 11/18/2018 8.1  6.5 - 8.1 g/dL Final  . Albumin 11/18/2018 4.3  3.5 - 5.0 g/dL Final  . AST 11/18/2018 41  15 - 41 U/L Final  . ALT 11/18/2018 36  0 - 44 U/L Final  . Alkaline Phosphatase 11/18/2018 107  38 - 126 U/L Final  . Total Bilirubin 11/18/2018 1.0  0.3 - 1.2 mg/dL Final  . GFR calc non Af Amer 11/18/2018 >60  >60 mL/min Final  . GFR calc Af Amer 11/18/2018 >60  >60 mL/min Final  . Anion gap 11/18/2018 11  5 - 15 Final   Performed at [facility], [address] [place], [place] [address]    Assessment:  Chris Mcdonald is a 63 y.o. male with unresectable hepatocellular carcinoma. He has a history of hepatitis C(treated with Harvoni in 04/11/2015). He has hepatitis B. Mass was discovered incidentally on low dose chest CT screenig program. AFPwas 7329. Child-Pugh ClassisA (score 5).  Low dose chest CTon 06/18/2018 revealed Lung-RADS 4A-S, suspicious. There was a new centrilobular nodularity in both lungs, more likely inflammatory. Three was a 8.5 x 5.7 cm poorly marginated geographic region of low attenuation in the superior right liver, new since 06/17/2017. Chest CTon 09/16/2018 revealed aLung-RADSscore of2, benign appearance or behavior. There was an ill-defined liver mass felt to represent a hepatoma on 07/10/2018, in the setting of cirrhosis.  Abdomen MRI on 07/10/2018 revealed an 8.8 cm heterogeneous infiltrating segment 8. Findings were consistent with a hepatoma, given the underlying cirrhotic changes involving the liver. This was also invading and obstructing the middle and right portal veins and the middle hepatic vein. There were scattered abdominal lymph nodes but no definite findings for metastatic disease.  AFPhas been followed: 8546 on 07/27/2018 and 6250 on 08/17/2018.  He began lenvatinibon 08/25/2018.  He has a history of hypercalcemia. Calcium was 11.0 on 08/17/2018. PTH and PTH-rp were low on  08/19/2018.  Symptomatically, he feels "ok".  He notes a little "belly ache" after eating.  He has intermittent right sided pain.  Blood pressure has improved with adjustment of his antihypertensive medications.  Weight fluctuates.  Exam is unremarkable.  Plan: 1.   Labs today: CBC with diff, CMP, AFP.  2. Hepatocellular carcinoma Clinically,  he appears to be doing well.              He is tolerating his lenvatinib.              Blood pressure has been slightly elevated but well controlled with adjustment of his antihypertensive medications.              Readdress plan for reimaging now that he has been on lenvatinib for almost 3 months.               Schedule liver MRI. 3. Hypertension Patient has baseline hypertension.             Hypertension has been exacerbated by lenvatinib.  Antihypertensives have been adjusted:    HCTZ from 12.5 to 25 mg a day.   Amlodipine from 5 to 10 mg a day.             Continue to monitor. 4. Electrolyte issues Sodium 132.  Potassium 2.9.               Potassium is low secondary to increase of HCTZ.               RN to call patient at home and confirm supply of potassium pills.  RN to follow-up with Dr Clide Deutscher re: potassium 5.   RTC after MRI for MD assessment, labs (BMP), and review of MRI  Addendum: Dr. Clide Deutscher requested patient begin potassium 20 mEq p.o. twice daily x1 week then recheck potassium  I discussed the assessment and treatment plan with the patient.  The patient was provided an opportunity to ask questions and all were answered.  The patient agreed with the plan and demonstrated an understanding of the instructions.  The patient was advised to call back if the symptoms worsen or if the condition fails to improve as anticipated.  I provided 15 minutes of face-to-face time during this this encounter and > 50% was spent counseling as documented under my assessment and plan.    Lequita Asal, MD, PhD    11/18/2018, 9:26 AM  I, Molly Dorshimer, am acting as Education administrator for [employer]. Mike Gip, MD, PhD.  I, Melissa C. Mike Gip, MD, have reviewed the above documentation for accuracy and completeness, and I agree with the above.

## 2018-11-18 ENCOUNTER — Inpatient Hospital Stay: Payer: Managed Care, Other (non HMO) | Attending: Hematology and Oncology | Admitting: Hematology and Oncology

## 2018-11-18 ENCOUNTER — Inpatient Hospital Stay: Payer: Managed Care, Other (non HMO)

## 2018-11-18 ENCOUNTER — Encounter: Payer: Self-pay | Admitting: Hematology and Oncology

## 2018-11-18 ENCOUNTER — Other Ambulatory Visit: Payer: Self-pay

## 2018-11-18 ENCOUNTER — Telehealth: Payer: Self-pay

## 2018-11-18 VITALS — BP 138/98 | HR 65 | Temp 98.4°F | Resp 18 | Ht 67.0 in | Wt 121.3 lb

## 2018-11-18 DIAGNOSIS — C220 Liver cell carcinoma: Secondary | ICD-10-CM | POA: Insufficient documentation

## 2018-11-18 DIAGNOSIS — Z7982 Long term (current) use of aspirin: Secondary | ICD-10-CM | POA: Insufficient documentation

## 2018-11-18 DIAGNOSIS — E876 Hypokalemia: Secondary | ICD-10-CM | POA: Diagnosis not present

## 2018-11-18 DIAGNOSIS — Z79899 Other long term (current) drug therapy: Secondary | ICD-10-CM | POA: Insufficient documentation

## 2018-11-18 DIAGNOSIS — F17210 Nicotine dependence, cigarettes, uncomplicated: Secondary | ICD-10-CM | POA: Diagnosis not present

## 2018-11-18 DIAGNOSIS — Z8673 Personal history of transient ischemic attack (TIA), and cerebral infarction without residual deficits: Secondary | ICD-10-CM | POA: Insufficient documentation

## 2018-11-18 DIAGNOSIS — I10 Essential (primary) hypertension: Secondary | ICD-10-CM | POA: Insufficient documentation

## 2018-11-18 DIAGNOSIS — R918 Other nonspecific abnormal finding of lung field: Secondary | ICD-10-CM

## 2018-11-18 LAB — CBC WITH DIFFERENTIAL/PLATELET
Abs Immature Granulocytes: 0.01 10*3/uL (ref 0.00–0.07)
Basophils Absolute: 0.1 10*3/uL (ref 0.0–0.1)
Basophils Relative: 1 %
Eosinophils Absolute: 0.3 10*3/uL (ref 0.0–0.5)
Eosinophils Relative: 5 %
HCT: 48.9 % (ref 39.0–52.0)
Hemoglobin: 16.9 g/dL (ref 13.0–17.0)
Immature Granulocytes: 0 %
Lymphocytes Relative: 37 %
Lymphs Abs: 2 10*3/uL (ref 0.7–4.0)
MCH: 29 pg (ref 26.0–34.0)
MCHC: 34.6 g/dL (ref 30.0–36.0)
MCV: 83.9 fL (ref 80.0–100.0)
Monocytes Absolute: 0.4 10*3/uL (ref 0.1–1.0)
Monocytes Relative: 7 %
Neutro Abs: 2.6 10*3/uL (ref 1.7–7.7)
Neutrophils Relative %: 50 %
Platelets: 148 10*3/uL — ABNORMAL LOW (ref 150–400)
RBC: 5.83 MIL/uL — ABNORMAL HIGH (ref 4.22–5.81)
RDW: 13.3 % (ref 11.5–15.5)
WBC: 5.2 10*3/uL (ref 4.0–10.5)
nRBC: 0 % (ref 0.0–0.2)

## 2018-11-18 LAB — COMPREHENSIVE METABOLIC PANEL
ALT: 36 U/L (ref 0–44)
AST: 41 U/L (ref 15–41)
Albumin: 4.3 g/dL (ref 3.5–5.0)
Alkaline Phosphatase: 107 U/L (ref 38–126)
Anion gap: 11 (ref 5–15)
BUN: 14 mg/dL (ref 6–20)
CO2: 28 mmol/L (ref 22–32)
Calcium: 9.2 mg/dL (ref 8.9–10.3)
Chloride: 93 mmol/L — ABNORMAL LOW (ref 98–111)
Creatinine, Ser: 0.9 mg/dL (ref 0.61–1.24)
GFR calc Af Amer: >60 mL/min (ref >60)
GFR calc non Af Amer: >60 mL/min (ref >60)
Glucose, Bld: 101 mg/dL — ABNORMAL HIGH (ref 70–99)
Potassium: 2.9 mmol/L — ABNORMAL LOW (ref 3.5–5.1)
Sodium: 132 mmol/L — ABNORMAL LOW (ref 135–145)
Total Bilirubin: 1 mg/dL (ref 0.3–1.2)
Total Protein: 8.1 g/dL (ref 6.5–8.1)

## 2018-11-18 NOTE — Progress Notes (Signed)
Patient c/o pain whole body ( pain level today 7). The patient is also having trouble swallowing at times and gagging on solids.

## 2018-11-18 NOTE — Telephone Encounter (Signed)
Spoke with Ms Clide Deutscher (MD) PCP office to inform her that the patient potassium level were at 2.9 today. Dr Clide Deutscher states she will start potassium 20 meq BID x 1 week then recheck potassium levels in 1 week after. The patient can walk in for labs  August 10-14. The patient was given the information before leaving clinic today. The patient was agreeable and understanding.

## 2018-11-19 ENCOUNTER — Telehealth: Payer: Self-pay

## 2018-11-19 LAB — AFP TUMOR MARKER: AFP, Serum, Tumor Marker: 467 ng/mL — ABNORMAL HIGH (ref 0.0–8.3)

## 2018-11-19 NOTE — Telephone Encounter (Signed)
-----   Message from Lequita Asal, MD sent at 11/19/2018  6:06 AM EDT ----- Regarding: Please call patient  Dramatic improvement in tumor marker !  M ----- Message ----- From: Buel Ream, Lab In [place] Sent: 11/18/2018   8:15 AM EDT To: Lequita Asal, MD

## 2018-11-19 NOTE — Telephone Encounter (Signed)
No answer / unable to leave a message i reached out to the patient to inform him of his AFP tumor markers( AFP) from 6,250.0 to 467.0. I will reach out to the patient later today to try to inform him.

## 2018-11-27 NOTE — Progress Notes (Signed)
[facility]  [address], [address] [place], [place] [address] Phone: [phone]  Fax: [phone]   Clinic Day:  11/30/2018  Referring physician: Donnie Coffin, MD  Chief Complaint: Chris Mcdonald is a 63 y.o. male with a history of hepatitis C and presumed hepatocellular carcinoma who is seenfor2 week assessmentonlenvatinib.  HPI: The patient was last seen in the medical oncology clinic on 11/18/2018. At that time, he felt "ok". He noted a little "belly ache" after eating. He had intermittent right sided pain. Blood pressure had improved with adjustment of his antihypertensive medications. Weight fluctuated. Exam was unremarkable. Potassium was 2.9. AFP was 467.0.  Dr. Clide Deutscher requested patient begin potassium 20 mEq p.o. twice daily x1 week then recheck potassium.   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  There was no evidence of new liver lesions or abdominal metastasis.  During the interim, he is doing "pretty good." He denies any complaints.   He stopped taking potassium supplements last week because he gags when he takes it.    Past Medical History:  Diagnosis Date  . Arthritis   . Hepatitis   . Hypertension   . Spinal stenosis   . Stroke [facility])     Past Surgical History:  Procedure Laterality Date  . COLONOSCOPY WITH PROPOFOL N/A 02/21/2015   Procedure: COLONOSCOPY WITH PROPOFOL;  Surgeon: Lollie Sails, MD;  Location: [facility] ENDOSCOPY;  Service: Endoscopy;  Laterality: N/A;  . INGUINAL HERNIA REPAIR Bilateral   . IR RADIOLOGIST EVAL & MGMT  08/04/2018  . POSTERIOR CERVICAL VERTEBRAE EXCISION      Family History  Problem Relation Age of Onset  . Cancer Brother   . Heart disease Brother     Social History:  reports that he has been smoking cigarettes. He has a 75.00 pack-year smoking history. He has never used  smokeless tobacco. He reports that he does not drink alcohol or use drugs. He lives in [place] with his wife and daughter.He has a 30 year old daughter, 35 year old son, and 66 year old son.The patient isalonetoday.  Allergies: No Known Allergies  Current Medications: Current Outpatient Medications  Medication Sig Dispense Refill  . amLODipine (NORVASC) 5 MG tablet Take 10 mg by mouth daily.     Marland Kitchen aspirin EC 81 MG tablet Take 81 mg by mouth daily.     . hydrochlorothiazide (HYDRODIURIL) 12.5 MG tablet Take 25 mg by mouth daily.     Marland Kitchen lenvatinib 8 mg daily dose (LENVIMA, 8 MG DAILY DOSE,) 2 x 4 MG capsule Take 2 capsules (8 mg total) by mouth daily. 60 capsule 0  . potassium chloride SA (K-DUR) 20 MEQ tablet Take 1 tablet (20 mEq total) by mouth daily. for 4 days. 20 tablet 0   No current facility-administered medications for this visit.     Review of Systems  Constitutional: Negative for chills, diaphoresis, fever, malaise/fatigue and weight loss (stable).       Feels "pretty good".  HENT: Negative.  Negative for congestion, ear pain, hearing loss, nosebleeds, sinus pain and sore throat.   Eyes: Negative.  Negative for blurred vision, double vision, photophobia and pain.  Respiratory: Negative for cough, sputum production, shortness of breath and wheezing.   Cardiovascular: Negative for chest pain, palpitations, orthopnea, leg swelling and PND.       Blood pressure WNL today  Gastrointestinal: Negative for abdominal  pain, blood in stool, constipation, diarrhea, heartburn, melena, nausea and vomiting.       Eating well. Dysphagia.  Genitourinary: Negative.  Negative for dysuria, frequency, hematuria and urgency.  Musculoskeletal: Positive for neck pain (steel rods in neck). Negative for back pain, joint pain and myalgias.  Skin: Negative.  Negative for itching and rash.  Neurological: Positive for weakness (generalized weakness). Negative for dizziness, tingling, sensory change, focal  weakness and headaches.       S/p left sided CVA.  Endo/Heme/Allergies: Negative.  Does not bruise/bleed easily.  Psychiatric/Behavioral: Negative.  Negative for depression and memory loss. The patient is not nervous/anxious and does not have insomnia.   All other systems reviewed and are negative.  Performance status (ECOG): 1  Vitals Blood pressure 129/85, pulse 64, temperature 98.2 F (36.8 C), temperature source Tympanic, resp. rate 18, height 5\' 7"  (1.702 m), weight 121 lb (54.9 kg), SpO2 100 %.   Physical Exam  Constitutional: He is oriented to person, place, and time. He appears well-developed and well-nourished. No distress.  HENT:  Head: Normocephalic and atraumatic.  Mouth/Throat: Oropharynx is clear and moist. No oropharyngeal exudate.  Wearing a mask. Short, brown graying hair and beard.  Eyes: Pupils are equal, round, and reactive to light. Conjunctivae and EOM are normal. No scleral icterus.  Blue eyes.  Neck: Normal range of motion. Neck supple. No JVD present.  Cardiovascular: Normal rate, regular rhythm and normal heart sounds. Exam reveals no gallop.  No murmur heard. Pulmonary/Chest: Effort normal and breath sounds normal. No respiratory distress. He has no wheezes. He has no rales.  Abdominal: Soft. Bowel sounds are normal. He exhibits no distension and no mass. There is no splenomegaly or hepatomegaly. There is no abdominal tenderness. There is no rebound and no guarding.  Musculoskeletal: Normal range of motion.        General: No edema.  Lymphadenopathy:    He has no cervical adenopathy.    He has no axillary adenopathy.       Right: No supraclavicular adenopathy present.       Left: No supraclavicular adenopathy present.  Neurological: He is alert and oriented to person, place, and time.  Skin: Skin is warm and dry. No rash noted. He is not diaphoretic. No erythema. No pallor.  Psychiatric: He has a normal mood and affect. His behavior is normal. Judgment and  thought content normal.  Nursing note and vitals reviewed.   No visits with results within 3 Day(s) from this visit.  Latest known visit with results is:  Appointment on 11/18/2018  Component Date Value Ref Range Status  . WBC 11/18/2018 5.2  4.0 - 10.5 K/uL Final  . RBC 11/18/2018 5.83* 4.22 - 5.81 MIL/uL Final  . Hemoglobin 11/18/2018 16.9  13.0 - 17.0 g/dL Final  . HCT 11/18/2018 48.9  39.0 - 52.0 % Final  . MCV 11/18/2018 83.9  80.0 - 100.0 fL Final  . MCH 11/18/2018 29.0  26.0 - 34.0 pg Final  . MCHC 11/18/2018 34.6  30.0 - 36.0 g/dL Final  . RDW 11/18/2018 13.3  11.5 - 15.5 % Final  . Platelets 11/18/2018 148* 150 - 400 K/uL Final  . nRBC 11/18/2018 0.0  0.0 - 0.2 % Final  . Neutrophils Relative % 11/18/2018 50  % Final  . Neutro Abs 11/18/2018 2.6  1.7 - 7.7 K/uL Final  . Lymphocytes Relative 11/18/2018 37  % Final  . Lymphs Abs 11/18/2018 2.0  0.7 - 4.0 K/uL Final  .  Monocytes Relative 11/18/2018 7  % Final  . Monocytes Absolute 11/18/2018 0.4  0.1 - 1.0 K/uL Final  . Eosinophils Relative 11/18/2018 5  % Final  . Eosinophils Absolute 11/18/2018 0.3  0.0 - 0.5 K/uL Final  . Basophils Relative 11/18/2018 1  % Final  . Basophils Absolute 11/18/2018 0.1  0.0 - 0.1 K/uL Final  . Immature Granulocytes 11/18/2018 0  % Final  . Abs Immature Granulocytes 11/18/2018 0.01  0.00 - 0.07 K/uL Final   Performed at [facility], [address], [place], [place] [address]  . Sodium 11/18/2018 132* 135 - 145 mmol/L Final  . Potassium 11/18/2018 2.9* 3.5 - 5.1 mmol/L Final  . Chloride 11/18/2018 93* 98 - 111 mmol/L Final  . CO2 11/18/2018 28  22 - 32 mmol/L Final  . Glucose, Bld 11/18/2018 101* 70 - 99 mg/dL Final  . BUN 11/18/2018 14  6 - 20 mg/dL Final  . Creatinine, Ser 11/18/2018 0.90  0.61 - 1.24 mg/dL Final  . Calcium 11/18/2018 9.2  8.9 - 10.3 mg/dL Final  . Total Protein 11/18/2018 8.1  6.5 - 8.1 g/dL Final  . Albumin 11/18/2018 4.3  3.5 - 5.0 g/dL Final  . AST  11/18/2018 41  15 - 41 U/L Final  . ALT 11/18/2018 36  0 - 44 U/L Final  . Alkaline Phosphatase 11/18/2018 107  38 - 126 U/L Final  . Total Bilirubin 11/18/2018 1.0  0.3 - 1.2 mg/dL Final  . GFR calc non Af Amer 11/18/2018 >60  >60 mL/min Final  . GFR calc Af Amer 11/18/2018 >60  >60 mL/min Final  . Anion gap 11/18/2018 11  5 - 15 Final   Performed at [facility], [address], [place],  [address]  . AFP, Serum, Tumor Marker 11/18/2018 467.0* 0.0 - 8.3 ng/mL Final   Comment: (NOTE) Roche [employer]ostics Electrochemiluminescence Immunoassay (ECLIA) Values obtained with different assay methods or kits cannot be used interchangeably.  Results cannot be interpreted as absolute evidence of the presence or absence of malignant disease. This test is not interpretable in pregnant females. Performed At: [facility] [place], [place] [id number] Rush Farmer MD GN:[id number]     Assessment:  Chris Mcdonald is a 63 y.o. male with unresectable hepatocellular carcinoma. He has a history of hepatitis C(treated with Harvoni in 04/11/2015). He has hepatitis B. Mass was discovered incidentally on low dose chest CT screenig program. AFPwas 7329. Child-Pugh ClassisA (score 5).  Low dose chest CTon 06/18/2018 revealed Lung-RADS 4A-S, suspicious. There was a new centrilobular nodularity in both lungs, more likely inflammatory. Three was a 8.5 x 5.7 cm poorly marginated geographic region of low attenuation in the superior right liver, new since 06/17/2017. Chest CTon 09/16/2018 revealed aLung-RADSscore of2, benign appearance or behavior. There was an ill-defined liver mass felt to represent a hepatoma on 07/10/2018, in the setting of cirrhosis.  Abdomen MRI on 07/10/2018 revealed an 8.8 cm heterogeneous infiltrating segment 8. Findings were consistent with a hepatoma, given the underlying cirrhotic changes involving the liver. This was also invading  and obstructing the middle and right portal veins and the middle hepatic vein. There were scattered abdominal lymph nodes but no definite findings for metastatic disease.  AFPhas been followed: 1694 on 07/27/2018, 6250 on 08/17/2018, and 467 on 11/18/2018.  He began lenvatinibon 08/25/2018.  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  There was no evidence of new liver lesions or abdominal metastasis.  He has a history of hypercalcemia. Calcium was 11.0 on 08/17/2018. PTH and PTH-rp were low on 08/19/2018.Calcium was 9.3 on 11/30/2018.  Symptomatically, he feels good.  Exam is stable.  Calcium is 9.3.  Plan: 1.   Labs today: BMP. 2. Hepatocellular carcinoma Clinically, he is doing extremely well. He is tolerating lenvatinib with only mild increase in his baseline HTN.  AFP has decreased from 6250 to 467.  Abdominal MRI reveals a response to therapy.  Images reviewed.  Discussed with patient.  Blood pressure well controlled on amlodipine and HCTZ.  Continue surveillance imaging every 3 months. 3. Hypertension Blood pressure 129/85.  Patient has baseline hypertension. Hypertension has been exacerbated by lenvatinib.  He is currently on HCTZ 25 mg a day and amlodipine 10 mg a day   Continue to monitor. 4. Electrolyte issues Sodium 136.  Potassium 3.4.   Potassium is low secondary to increase of HCTZ.  Patient notes difficulty swallowing potassium pills.   He believes he ran out of potassium pills last week.  Encourage patient to follow-up with Dr. Clide Deutscher. 5.   RTC in 1 month for MD assessment and labs (CBC with diff, CMP, AFP).  I discussed the assessment and treatment plan with the patient.  The patient was provided an  opportunity to ask questions and all were answered.  The patient agreed with the plan and demonstrated an understanding of the instructions.  The patient was advised to call back if the symptoms worsen or if the condition fails to improve as anticipated.  I provided 10 minutes of face-to-face time during this this encounter and > 50% was spent counseling as documented under my assessment and plan.    Lequita Asal, MD, PhD    11/30/2018, 8:51 AM  I, Cloyde Reams Dorshimer, am acting as Education administrator for [employer]. Mike Gip, MD, PhD.  I,  C. Mike Gip, MD, have reviewed the above documentation for accuracy and completeness, and I agree with the above.

## 2018-11-28 ENCOUNTER — Other Ambulatory Visit: Payer: Self-pay

## 2018-11-28 ENCOUNTER — Ambulatory Visit
Admission: RE | Admit: 2018-11-28 | Discharge: 2018-11-28 | Disposition: A | Discharge status: Home / Self Care | Payer: Managed Care, Other (non HMO) | Source: Ambulatory Visit | Attending: Hematology and Oncology | Admitting: Hematology and Oncology

## 2018-11-28 DIAGNOSIS — C220 Liver cell carcinoma: Secondary | ICD-10-CM | POA: Insufficient documentation

## 2018-11-28 MED ORDER — GADOBUTROL 1 MMOL/ML IV SOLN
5.0000 mL | Freq: Once | INTRAVENOUS | Status: AC | PRN
  Administered 2018-11-28: 08:00:00 5 mL via INTRAVENOUS

## 2018-11-30 ENCOUNTER — Encounter: Payer: Self-pay | Admitting: Hematology and Oncology

## 2018-11-30 ENCOUNTER — Inpatient Hospital Stay: Payer: Managed Care, Other (non HMO) | Attending: Hematology and Oncology | Admitting: Hematology and Oncology

## 2018-11-30 ENCOUNTER — Other Ambulatory Visit: Payer: Self-pay

## 2018-11-30 ENCOUNTER — Inpatient Hospital Stay: Payer: Managed Care, Other (non HMO)

## 2018-11-30 VITALS — BP 129/85 | HR 64 | Temp 98.2°F | Resp 18 | Ht 67.0 in | Wt 121.0 lb

## 2018-11-30 DIAGNOSIS — E876 Hypokalemia: Secondary | ICD-10-CM | POA: Diagnosis not present

## 2018-11-30 DIAGNOSIS — C220 Liver cell carcinoma: Secondary | ICD-10-CM

## 2018-11-30 DIAGNOSIS — Z7982 Long term (current) use of aspirin: Secondary | ICD-10-CM | POA: Diagnosis not present

## 2018-11-30 DIAGNOSIS — M1990 Unspecified osteoarthritis, unspecified site: Secondary | ICD-10-CM | POA: Diagnosis not present

## 2018-11-30 DIAGNOSIS — I10 Essential (primary) hypertension: Secondary | ICD-10-CM | POA: Diagnosis not present

## 2018-11-30 DIAGNOSIS — Z8673 Personal history of transient ischemic attack (TIA), and cerebral infarction without residual deficits: Secondary | ICD-10-CM | POA: Diagnosis not present

## 2018-11-30 DIAGNOSIS — Z8619 Personal history of other infectious and parasitic diseases: Secondary | ICD-10-CM | POA: Insufficient documentation

## 2018-11-30 DIAGNOSIS — Z79899 Other long term (current) drug therapy: Secondary | ICD-10-CM | POA: Insufficient documentation

## 2018-11-30 DIAGNOSIS — F17210 Nicotine dependence, cigarettes, uncomplicated: Secondary | ICD-10-CM | POA: Insufficient documentation

## 2018-11-30 DIAGNOSIS — M542 Cervicalgia: Secondary | ICD-10-CM | POA: Insufficient documentation

## 2018-11-30 LAB — BASIC METABOLIC PANEL
Anion gap: 10 (ref 5–15)
BUN: 14 mg/dL (ref 6–20)
CO2: 27 mmol/L (ref 22–32)
Calcium: 9.3 mg/dL (ref 8.9–10.3)
Chloride: 99 mmol/L (ref 98–111)
Creatinine, Ser: 0.95 mg/dL (ref 0.61–1.24)
GFR calc Af Amer: >60 mL/min (ref >60)
GFR calc non Af Amer: >60 mL/min (ref >60)
Glucose, Bld: 110 mg/dL — ABNORMAL HIGH (ref 70–99)
Potassium: 3.4 mmol/L — ABNORMAL LOW (ref 3.5–5.1)
Sodium: 136 mmol/L (ref 135–145)

## 2018-12-24 NOTE — Progress Notes (Signed)
[facility]  [address], [address] [place], [place] [address] Phone: [phone]  Fax: [phone]   Clinic Day:  12/29/2018  Referring physician: Donnie Coffin, MD  Chief Complaint: Chris Mcdonald is a 63 y.o. male with a history of hepatitis C and presumed hepatocellular carcinoma who is seen for 1 month assessment onlenvatinib.  HPI: The patient was last seen in the medical oncology clinic on 11/30/2018. At that time, he felt good. Exam was stable.  Hypertension had been exacerbated by lenvatinib, but was well controlled.  He was taking HCTZ 25 mg a day and amlodipine 10 mg a day.  Abdominal MRI revealed a response to therapy.  Potasium was 3.4. Sodium was 136. Calcium was 9.3.  During the interim, the patient felt "pretty good". Patient reports that he has nausea and vomiting twice a week. He notes vomiting after eating hotdogs or hamburgers; other foods he can keep down. His weight is down 6 lbs. He drinks 1 Boost daily.  He is interested in trying a nausea medication.  He notes never taking his potassium pills. He has dysphagia with the potassium pills. He was using water to break the pill down but he would still have trouble swallowing and it resulted in him gagging.   Last dose of lenvatinib was on 12/27/2018.    Past Medical History:  Diagnosis Date  . Arthritis   . Hepatitis   . Hypertension   . Spinal stenosis   . Stroke [facility])     Past Surgical History:  Procedure Laterality Date  . COLONOSCOPY WITH PROPOFOL N/A 02/21/2015   Procedure: COLONOSCOPY WITH PROPOFOL;  Surgeon: Lollie Sails, MD;  Location: [facility] ENDOSCOPY;  Service: Endoscopy;  Laterality: N/A;  . INGUINAL HERNIA REPAIR Bilateral   . IR RADIOLOGIST EVAL & MGMT  08/04/2018  . POSTERIOR CERVICAL VERTEBRAE EXCISION      Family History  Problem Relation Age of Onset  . Cancer Brother   . Heart disease Brother     Social History:  reports that he has been smoking  cigarettes. He has a 75.00 pack-year smoking history. He has never used smokeless tobacco. He reports that he does not drink alcohol or use drugs. He lives in [place] with his wife and daughter.He has a 85 year old daughter, 64 year old son, and 37 year old son. The patient is alone today.  Allergies: No Known Allergies  Current Medications: Current Outpatient Medications  Medication Sig Dispense Refill  . amLODipine (NORVASC) 5 MG tablet Take 10 mg by mouth daily.     Marland Kitchen aspirin EC 81 MG tablet Take 81 mg by mouth daily.     . hydrochlorothiazide (HYDRODIURIL) 12.5 MG tablet Take 25 mg by mouth daily.     Marland Kitchen lenvatinib 8 mg daily dose (LENVIMA, 8 MG DAILY DOSE,) 2 x 4 MG capsule Take 2 capsules (8 mg total) by mouth daily. 60 capsule 0  . potassium chloride SA (K-DUR) 20 MEQ tablet Take 1 tablet (20 mEq total) by mouth daily. for 4 days. (Patient not taking: Reported on 12/29/2018) 20 tablet 0   No current facility-administered medications for this visit.     Review of Systems  Constitutional: Positive for weight loss (6 lbs). Negative for chills, diaphoresis, fever and malaise/fatigue.       Feels "pretty good".  HENT: Negative.  Negative for congestion, ear pain, hearing loss, nosebleeds, sinus pain and sore throat.   Eyes: Negative.  Negative for blurred vision, double  vision, photophobia and pain.  Respiratory: Negative for cough, sputum production, shortness of breath and wheezing.   Cardiovascular: Negative for chest pain, palpitations, orthopnea, leg swelling and PND.  Gastrointestinal: Positive for nausea and vomiting (2x/week). Negative for abdominal pain, blood in stool, constipation, diarrhea, heartburn and melena.       Dysphagia.  Some foods cause emesis.  Genitourinary: Negative.  Negative for dysuria, frequency, hematuria and urgency.  Musculoskeletal: Negative for back pain, joint pain, myalgias and neck pain.       Steel rods in neck.  Skin: Negative.  Negative for itching  and rash.  Neurological: Negative for dizziness, tingling, sensory change, focal weakness, weakness (generalized weakness) and headaches.       S/p left sided CVA.  Endo/Heme/Allergies: Negative.  Does not bruise/bleed easily.  Psychiatric/Behavioral: Negative.  Negative for depression and memory loss. The patient is not nervous/anxious and does not have insomnia.   All other systems reviewed and are negative.  Performance status (ECOG): 1  Vitals Blood pressure 113/83, pulse 66, temperature 97.8 F (36.6 C), temperature source Tympanic, resp. rate 18, height 5\' 7"  (1.702 m), weight 115 lb 3.1 oz (52.3 kg), SpO2 100 %.  Physical Exam  Constitutional: He is oriented to person, place, and time. He appears well-developed and well-nourished. No distress.  HENT:  Head: Normocephalic and atraumatic.  Mouth/Throat: Oropharynx is clear and moist. No oropharyngeal exudate.  Short, brown graying hair and beard.  Temporal wasting.  Mask.  Eyes: Pupils are equal, round, and reactive to light. Conjunctivae and EOM are normal. No scleral icterus.  Blue eyes.  Neck: Normal range of motion. Neck supple. No JVD present.  Cardiovascular: Normal rate, regular rhythm and normal heart sounds. Exam reveals no gallop.  No murmur heard. Pulmonary/Chest: Effort normal and breath sounds normal. No respiratory distress. He has no wheezes. He has no rales.  Abdominal: Soft. Bowel sounds are normal. He exhibits no distension and no mass. There is no splenomegaly or hepatomegaly. There is no abdominal tenderness. There is no rebound and no guarding.  Musculoskeletal: Normal range of motion.        General: No edema.  Lymphadenopathy:    He has no cervical adenopathy.    He has no axillary adenopathy.       Right: No supraclavicular adenopathy present.       Left: No supraclavicular adenopathy present.  Neurological: He is alert and oriented to person, place, and time.  Skin: Skin is warm and dry. No rash noted.  He is not diaphoretic. No erythema. No pallor.  Psychiatric: He has a normal mood and affect. His behavior is normal. Judgment and thought content normal.  Nursing note and vitals reviewed.   Appointment on 12/29/2018  Component Date Value Ref Range Status  . Sodium 12/29/2018 132* 135 - 145 mmol/L Final  . Potassium 12/29/2018 2.8* 3.5 - 5.1 mmol/L Final  . Chloride 12/29/2018 94* 98 - 111 mmol/L Final  . CO2 12/29/2018 28  22 - 32 mmol/L Final  . Glucose, Bld 12/29/2018 97  70 - 99 mg/dL Final  . BUN 12/29/2018 16  6 - 20 mg/dL Final  . Creatinine, Ser 12/29/2018 0.77  0.61 - 1.24 mg/dL Final  . Calcium 12/29/2018 9.2  8.9 - 10.3 mg/dL Final  . Total Protein 12/29/2018 8.1  6.5 - 8.1 g/dL Final  . Albumin 12/29/2018 4.1  3.5 - 5.0 g/dL Final  . AST 12/29/2018 49* 15 - 41 U/L Final  . ALT 12/29/2018  46* 0 - 44 U/L Final  . Alkaline Phosphatase 12/29/2018 110  38 - 126 U/L Final  . Total Bilirubin 12/29/2018 1.1  0.3 - 1.2 mg/dL Final  . GFR calc non Af Amer 12/29/2018 >60  >60 mL/min Final  . GFR calc Af Amer 12/29/2018 >60  >60 mL/min Final  . Anion gap 12/29/2018 10  5 - 15 Final   Performed at [facility], [address] [place], [place] [address]  . WBC 12/29/2018 5.8  4.0 - 10.5 K/uL Final  . RBC 12/29/2018 5.03  4.22 - 5.81 MIL/uL Final  . Hemoglobin 12/29/2018 15.2  13.0 - 17.0 g/dL Final  . HCT 12/29/2018 43.8  39.0 - 52.0 % Final  . MCV 12/29/2018 87.1  80.0 - 100.0 fL Final  . MCH 12/29/2018 30.2  26.0 - 34.0 pg Final  . MCHC 12/29/2018 34.7  30.0 - 36.0 g/dL Final  . RDW 12/29/2018 15.4  11.5 - 15.5 % Final  . Platelets 12/29/2018 145* 150 - 400 K/uL Final  . nRBC 12/29/2018 0.0  0.0 - 0.2 % Final  . Neutrophils Relative % 12/29/2018 62  % Final  . Neutro Abs 12/29/2018 3.6  1.7 - 7.7 K/uL Final  . Lymphocytes Relative 12/29/2018 29  % Final  . Lymphs Abs 12/29/2018 1.7  0.7 - 4.0 K/uL Final  . Monocytes Relative 12/29/2018 6  % Final  . Monocytes  Absolute 12/29/2018 0.4  0.1 - 1.0 K/uL Final  . Eosinophils Relative 12/29/2018 2  % Final  . Eosinophils Absolute 12/29/2018 0.1  0.0 - 0.5 K/uL Final  . Basophils Relative 12/29/2018 1  % Final  . Basophils Absolute 12/29/2018 0.0  0.0 - 0.1 K/uL Final  . Immature Granulocytes 12/29/2018 0  % Final  . Abs Immature Granulocytes 12/29/2018 0.02  0.00 - 0.07 K/uL Final   Performed at [facility], [address] [place], [place] [address]    Assessment:  Chris Mcdonald is a 64 y.o. male with unresectable hepatocellular carcinoma. He has a history of hepatitis C(treated with Harvoni in 04/11/2015). He has hepatitis B. Mass was discovered incidentally on low dose chest CT screenig program. AFPwas 7329. Child-Pugh ClassisA (score 5).  Low dose chest CTon 06/18/2018 revealed Lung-RADS 4A-S, suspicious. There was a new centrilobular nodularity in both lungs, more likely inflammatory. Three was a 8.5 x 5.7 cm poorly marginated geographic region of low attenuation in the superior right liver, new since 06/17/2017. Chest CTon 09/16/2018 revealed aLung-RADSscore of2, benign appearance or behavior. There was an ill-defined liver mass felt to represent a hepatoma on 07/10/2018, in the setting of cirrhosis.  Abdomen MRI on 07/10/2018 revealed an 8.8 cm heterogeneous infiltrating segment 8. Findings were consistent with a hepatoma, given the underlying cirrhotic changes involving the liver. This was also invading and obstructing the middle and right portal veins and the middle hepatic vein. There were scattered abdominal lymph nodes but no definite findings for metastatic disease.  AFPhas been followed: [id number] on 07/27/2018, 6250 on 08/17/2018, 467 on 11/18/2018, and 271 on 12/29/2018.  He began lenvatinibon 08/25/2018.  Abdominal MRI on 11/28/2018 revealed response to therapy of segment 8 hepatocellular carcinoma with persistent tumor thrombus extension into the right  portal vein.  There was no evidence of new liver lesions or abdominal metastasis.  He has a history of hypercalcemia. Calcium was 11.0 on 08/17/2018. PTH and PTH-rp were low on 08/19/2018.Calcium was 9.3 on 11/30/2018.  Symptomatically, he is having some issues  with nausea.  He notes emesis 2x/week.  He can not swallow his potassium pills.  Potassium is 2.8.  AST is 49 and ALT 46.  Plan: 1.   Labs today: CBC with diff, CMP, AFP. 2. Hepatocellular carcinoma Clinically, he is doing fairly well.    He appears to be tolerating his lenvatinib.     Etiology of his new nausea is unclear.     Blood pressure is well controlled.  AFP has decreased from 6250 to 271.             Abdominal MRI on 11/28/2018 revealed a response to therapy.  Continue lenvatinib.  Contact Nuala Alpha, pharmacist, regarding renewal.  Continue surveillance imaging every 3 months (next imaging 02/28/2019). 3. Hypertension Blood pressure 113/83.             Patient has baseline hypertension. Lenvatinib has exacerbated baseline hypertension. Hypertension under good control with HCTZ 25 mg a day and amlodipine 10 mg a day.   HCTZ has caused issues with hypokalemia.              Continue to monitor. 4. Electrolyte issues Sodium 132. Potassium 2.8.             Potassium is low secondary to HCTZ. Patient notes difficulty swallowing potassium pills.             Potassium 40 meq IV in clinic today.  One hour post IV infusion, potassium was 3.1.  Patient to try oral potassium with butter coating per nursing.  If unable to tolerate, discuss liquid potassium.            Follow-up with Dr. Clide Deutscher.  Nursing contacted office. 5.   Weight loss  Etiology felt secondary to nausea.  Ondansetron 4 mg po q 8 hours prn nausea.  Encourage taking anti-emetic before meals.  Discuss small frequent meals.   Encourage drinking Boost 2-3 x/day. 6.   RTC in 1 week for MD assessment and labs (CMP).  I discussed the assessment and treatment plan with the patient.  The patient was provided an opportunity to ask questions and all were answered.  The patient agreed with the plan and demonstrated an understanding of the instructions.  The patient was advised to call back if the symptoms worsen or if the condition fails to improve as anticipated.   Lequita Asal, MD, PhD    12/29/2018, 9:08 AM  I, Selena Batten, am acting as scribe for [employer]. Mike Gip, MD, PhD.  I,  C. Mike Gip, MD, have reviewed the above documentation for accuracy and completeness, and I agree with the above.

## 2018-12-29 ENCOUNTER — Other Ambulatory Visit: Payer: Self-pay

## 2018-12-29 ENCOUNTER — Encounter: Payer: Self-pay | Admitting: Hematology and Oncology

## 2018-12-29 ENCOUNTER — Inpatient Hospital Stay: Payer: Managed Care, Other (non HMO)

## 2018-12-29 ENCOUNTER — Inpatient Hospital Stay: Payer: Managed Care, Other (non HMO) | Attending: Hematology and Oncology | Admitting: Hematology and Oncology

## 2018-12-29 VITALS — BP 113/83 | HR 66 | Temp 97.8°F | Resp 18 | Ht 67.0 in | Wt 115.2 lb

## 2018-12-29 DIAGNOSIS — R1310 Dysphagia, unspecified: Secondary | ICD-10-CM | POA: Diagnosis not present

## 2018-12-29 DIAGNOSIS — R112 Nausea with vomiting, unspecified: Secondary | ICD-10-CM | POA: Diagnosis not present

## 2018-12-29 DIAGNOSIS — Z7982 Long term (current) use of aspirin: Secondary | ICD-10-CM | POA: Diagnosis not present

## 2018-12-29 DIAGNOSIS — C220 Liver cell carcinoma: Secondary | ICD-10-CM

## 2018-12-29 DIAGNOSIS — E876 Hypokalemia: Secondary | ICD-10-CM

## 2018-12-29 DIAGNOSIS — Z79899 Other long term (current) drug therapy: Secondary | ICD-10-CM | POA: Insufficient documentation

## 2018-12-29 DIAGNOSIS — R634 Abnormal weight loss: Secondary | ICD-10-CM | POA: Insufficient documentation

## 2018-12-29 DIAGNOSIS — Z8673 Personal history of transient ischemic attack (TIA), and cerebral infarction without residual deficits: Secondary | ICD-10-CM | POA: Diagnosis not present

## 2018-12-29 DIAGNOSIS — Z8619 Personal history of other infectious and parasitic diseases: Secondary | ICD-10-CM | POA: Diagnosis not present

## 2018-12-29 DIAGNOSIS — M1990 Unspecified osteoarthritis, unspecified site: Secondary | ICD-10-CM | POA: Insufficient documentation

## 2018-12-29 DIAGNOSIS — I10 Essential (primary) hypertension: Secondary | ICD-10-CM | POA: Diagnosis not present

## 2018-12-29 DIAGNOSIS — F17210 Nicotine dependence, cigarettes, uncomplicated: Secondary | ICD-10-CM | POA: Insufficient documentation

## 2018-12-29 LAB — COMPREHENSIVE METABOLIC PANEL
ALT: 46 U/L — ABNORMAL HIGH (ref 0–44)
AST: 49 U/L — ABNORMAL HIGH (ref 15–41)
Albumin: 4.1 g/dL (ref 3.5–5.0)
Alkaline Phosphatase: 110 U/L (ref 38–126)
Anion gap: 10 (ref 5–15)
BUN: 16 mg/dL (ref 6–20)
CO2: 28 mmol/L (ref 22–32)
Calcium: 9.2 mg/dL (ref 8.9–10.3)
Chloride: 94 mmol/L — ABNORMAL LOW (ref 98–111)
Creatinine, Ser: 0.77 mg/dL (ref 0.61–1.24)
GFR calc Af Amer: >60 mL/min (ref >60)
GFR calc non Af Amer: >60 mL/min (ref >60)
Glucose, Bld: 97 mg/dL (ref 70–99)
Potassium: 2.8 mmol/L — ABNORMAL LOW (ref 3.5–5.1)
Sodium: 132 mmol/L — ABNORMAL LOW (ref 135–145)
Total Bilirubin: 1.1 mg/dL (ref 0.3–1.2)
Total Protein: 8.1 g/dL (ref 6.5–8.1)

## 2018-12-29 LAB — CBC WITH DIFFERENTIAL/PLATELET
Abs Immature Granulocytes: 0.02 10*3/uL (ref 0.00–0.07)
Basophils Absolute: 0 10*3/uL (ref 0.0–0.1)
Basophils Relative: 1 %
Eosinophils Absolute: 0.1 10*3/uL (ref 0.0–0.5)
Eosinophils Relative: 2 %
HCT: 43.8 % (ref 39.0–52.0)
Hemoglobin: 15.2 g/dL (ref 13.0–17.0)
Immature Granulocytes: 0 %
Lymphocytes Relative: 29 %
Lymphs Abs: 1.7 10*3/uL (ref 0.7–4.0)
MCH: 30.2 pg (ref 26.0–34.0)
MCHC: 34.7 g/dL (ref 30.0–36.0)
MCV: 87.1 fL (ref 80.0–100.0)
Monocytes Absolute: 0.4 10*3/uL (ref 0.1–1.0)
Monocytes Relative: 6 %
Neutro Abs: 3.6 10*3/uL (ref 1.7–7.7)
Neutrophils Relative %: 62 %
Platelets: 145 10*3/uL — ABNORMAL LOW (ref 150–400)
RBC: 5.03 MIL/uL (ref 4.22–5.81)
RDW: 15.4 % (ref 11.5–15.5)
WBC: 5.8 10*3/uL (ref 4.0–10.5)
nRBC: 0 % (ref 0.0–0.2)

## 2018-12-29 LAB — POTASSIUM: Potassium: 3.1 mmol/L — ABNORMAL LOW (ref 3.5–5.1)

## 2018-12-29 MED ORDER — SODIUM CHLORIDE 0.9 % IV SOLN
Freq: Once | INTRAVENOUS | Status: AC
  Administered 2018-12-29: 10:00:00 via INTRAVENOUS
  Filled 2018-12-29: qty 250

## 2018-12-29 MED ORDER — SODIUM CHLORIDE 0.9 % IV SOLN
40.0000 meq | Freq: Once | INTRAVENOUS | Status: AC
  Administered 2018-12-29: 10:00:00 40 meq via INTRAVENOUS
  Filled 2018-12-29: qty 20

## 2018-12-29 MED ORDER — LENVIMA (8 MG DAILY DOSE) 2 X 4 MG PO CPPK: 8.0000 mg | ORAL_CAPSULE | Freq: Every day | ORAL | 0 refills | Status: DC

## 2018-12-29 NOTE — Progress Notes (Signed)
Patient c/o nausea and vomiting twice a week , and unable to swallow potassium pills.

## 2018-12-29 NOTE — Patient Instructions (Signed)
Potassium chloride injection What is this medicine? POTASSIUM CHLORIDE (poe TASS i um KLOOR ide) is a potassium supplement used to prevent and to treat low potassium. Potassium is important for the heart, muscles, and nerves. Too much or too little potassium in the body can cause serious problems. This medicine may be used for other purposes; ask your health care provider or pharmacist if you have questions. COMMON BRAND NAME(S): PROAMP What should I tell my health care provider before I take this medicine? They need to know if you have any of these conditions:  Addison's disease  dehydration  diabetes  heart disease  high levels of potassium in the blood  irregular heartbeat  kidney disease  recent severe burn  an unusual or allergic reaction to potassium, other medicines, foods, dyes, or preservatives  pregnant or trying to get pregnant  breast-feeding How should I use this medicine? This medicine is for infusion into a vein. It is given by a health care professional in a hospital or clinic setting. Talk to your pediatrician regarding the use of this medicine in children. Special care may be needed. Overdosage: If you think you have taken too much of this medicine contact a poison control center or emergency room at once. NOTE: This medicine is only for you. Do not share this medicine with others. What if I miss a dose? This does not apply. What may interact with this medicine? Do not take this medicine with any of the following medications:  certain diuretics such as spironolactone, triamterene  eplerenone  sodium polystyrene sulfonate This medicine may also interact with the following medications:  certain medicines for blood pressure or heart disease like lisinopril, losartan, quinapril, valsartan  medicines that lower your chance of fighting infection such as cyclosporine, tacrolimus  NSAIDs, medicines for pain and inflammation, like ibuprofen or  naproxen  other potassium supplements  salt substitutes This list may not describe all possible interactions. Give your health care provider a list of all the medicines, herbs, non-prescription drugs, or dietary supplements you use. Also tell them if you smoke, drink alcohol, or use illegal drugs. Some items may interact with your medicine. What should I watch for while using this medicine? Your condition will be monitored carefully while you are receiving this medicine. You may need blood work done while you are taking this medicine. What side effects may I notice from receiving this medicine? Side effects that you should report to your doctor or health care professional as soon as possible:  allergic reactions like skin rash, itching or hives, swelling of the face, lips, or tongue  breathing problems  confusion  fast, irregular heartbeat  feeling faint or lightheaded, falls  low blood pressure  numbness or tingling in hands or feet  pain, redness, or irritation at site where injected  unusually weak or tired  weakness, heaviness of legs Side effects that usually do not require medical attention (report to your doctor or health care professional if they continue or are bothersome):  diarrhea  nausea, vomiting  stomach pain This list may not describe all possible side effects. Call your doctor for medical advice about side effects. You may report side effects to FDA at 1-800-FDA-1088. Where should I keep my medicine? This drug is given in a hospital or clinic and will not be stored at home. NOTE: This sheet is a summary. It may not cover all possible information. If you have questions about this medicine, talk to your doctor, pharmacist, or health care provider.    2020 Elsevier/Gold Standard (2016-01-10 13:59:20)  

## 2018-12-30 ENCOUNTER — Telehealth: Payer: Self-pay

## 2018-12-30 LAB — AFP TUMOR MARKER: AFP, Serum, Tumor Marker: 271 ng/mL — ABNORMAL HIGH (ref 0.0–8.3)

## 2018-12-30 NOTE — Telephone Encounter (Signed)
Spoke with Mr conditt to see how he was doing with taking the potassium pills. The patient states he was able to put the pill in butter and it was able to go down with a little gagging, but he was a successful at getting the pill down.

## 2018-12-30 NOTE — Telephone Encounter (Signed)
-----   Message from Lequita Asal, MD sent at 12/30/2018  2:47 PM EDT ----- Regarding: Please call patient with AFP  AFP continues to improve.  M ----- Message ----- From: Buel Ream, [facility] [place] Sent: 12/29/2018   8:17 AM EDT To: Lequita Asal, MD

## 2018-12-30 NOTE — Telephone Encounter (Signed)
Informed patient of AFP results. Patient verbalizes understanding and denies any further questions or concerns.

## 2018-12-30 NOTE — Telephone Encounter (Signed)
Spoke to Roci at the patient PCP office to inform them that the patient potassium was low in office yesterday and he was given 40 meq IV potassium over 4 hrs. The patient is having trouble swolloen large pills and didn't take the potassium over the time he was told to. I have reach out to the patient to inform him to try to take the potassium in butter or apple sauce to make it easier to swallow the pill  last night. The patient states he would try it. I have try to contact the patient again this AM and didn't get a answer. I will re try to reach the patient and see if he was able to take the pill last night.

## 2019-01-03 DIAGNOSIS — R634 Abnormal weight loss: Secondary | ICD-10-CM | POA: Insufficient documentation

## 2019-01-03 MED ORDER — ONDANSETRON HCL 4 MG PO TABS: 4.0000 mg | ORAL_TABLET | Freq: Three times a day (TID) | ORAL | 0 refills | Status: DC | PRN

## 2019-01-03 NOTE — Progress Notes (Signed)
[facility]  [address], [address] [place], [place] [address] Phone: [phone]  Fax: [phone]   Clinic Day:  01/05/2019  Referring physician: Donnie Coffin, MD  Chief Complaint: Chris Mcdonald is a 63 y.o. male with a history of hepatitis C and presumed hepatocellular carcinoma who is seen for a 1 week assessment onlenvatinib.   HPI: The patient was last seen in the medical oncology clinic on 12/29/2018. At that time, he was having some issues with nausea. He noted emesis 2x/week. He has lost 6 pounds.  He could not swallow his potassium pills. Potassium was 2.8 felt secondary to HCTZ.  He received 40 meq IV potassium in clinic.  Repeat potassium after IV potassium was 3.1. AST was 49 and ALT 46. We discussed use of ondansetron 4 mg every 8 hours for nausea. We discussed eating small frequent meals and drinking Boost 2-3 times a day.  Oral potassium pills with butter coating was recommended and if unsuccessful, liquid potassium.  During the interim, the patient has felt "pretty good". He has been taking 1 potassium pill daily with apple sauce. Patient is taking Zofran as needed since 12/29/2018. He denies any emesis, nausea, and diarrhea. He notes pain that is "ongoing".   Weight is back up to baseline.   Past Medical History:  Diagnosis Date  . Arthritis   . Hepatitis   . Hypertension   . Spinal stenosis   . Stroke [facility])     Past Surgical History:  Procedure Laterality Date  . COLONOSCOPY WITH PROPOFOL N/A 02/21/2015   Procedure: COLONOSCOPY WITH PROPOFOL;  Surgeon: Lollie Sails, MD;  Location: [facility] ENDOSCOPY;  Service: Endoscopy;  Laterality: N/A;  . INGUINAL HERNIA REPAIR Bilateral   . IR RADIOLOGIST EVAL & MGMT  08/04/2018  . POSTERIOR CERVICAL VERTEBRAE EXCISION      Family History  Problem Relation Age of Onset  . Cancer Brother   . Heart disease Brother     Social History:  reports that he has been smoking cigarettes. He has a  75.00 pack-year smoking history. He has never used smokeless tobacco. He reports that he does not drink alcohol or use drugs.  He lives in [place] with his wife and daughter.He has a 66 year old daughter, 33 year old son, and 71 year old son. The patient is alone today.  Allergies: No Known Allergies  Current Medications: Current Outpatient Medications  Medication Sig Dispense Refill  . amLODipine (NORVASC) 5 MG tablet Take 10 mg by mouth daily.     Marland Kitchen aspirin EC 81 MG tablet Take 81 mg by mouth daily.     . hydrochlorothiazide (HYDRODIURIL) 12.5 MG tablet Take 25 mg by mouth daily.     Marland Kitchen lenvatinib 8 mg daily dose (LENVIMA, 8 MG DAILY DOSE,) 2 x 4 MG capsule Take 2 capsules (8 mg total) by mouth daily. 60 capsule 0  . potassium chloride SA (K-DUR) 20 MEQ tablet Take 1 tablet (20 mEq total) by mouth daily. for 4 days. 20 tablet 0  . ondansetron (ZOFRAN) 4 MG tablet Take 1 tablet (4 mg total) by mouth every 8 (eight) hours as needed for nausea or vomiting. (Patient not taking: Reported on 01/05/2019) 20 tablet 0   No current facility-administered medications for this visit.     Review of Systems  Constitutional: Negative for chills, diaphoresis, fever, malaise/fatigue and weight loss (up 7 pounds).       Feels "pretty good".  HENT: Negative.  Negative  for congestion, ear pain, hearing loss, nosebleeds, sinus pain and sore throat.   Eyes: Negative.  Negative for blurred vision, double vision, photophobia and pain.  Respiratory: Negative for cough, sputum production, shortness of breath and wheezing.   Cardiovascular: Negative.  Negative for chest pain, palpitations, orthopnea, leg swelling and PND.  Gastrointestinal: Negative for abdominal pain, blood in stool, constipation, diarrhea, heartburn, melena, nausea (using ondansetron prn) and vomiting.       Dysphagia.  Eating well.  Genitourinary: Negative.  Negative for dysuria, frequency, hematuria and urgency.  Musculoskeletal: Negative for back  pain, joint pain, myalgias and neck pain.       Steel rods in neck.  Skin: Negative.  Negative for itching and rash.  Neurological: Negative for dizziness, sensory change, focal weakness, weakness (generalized weakness) and headaches.       S/p left sided CVA.  Endo/Heme/Allergies: Negative.  Does not bruise/bleed easily.  Psychiatric/Behavioral: Negative.  Negative for depression and memory loss. The patient is not nervous/anxious and does not have insomnia.   All other systems reviewed and are negative.  Performance status (ECOG): 1  Vitals Blood pressure 115/79, pulse 71, temperature (!) 97 F (36.1 C), temperature source Tympanic, resp. rate 18, height 5\' 7"  (1.702 m), weight 122 lb 12.7 oz (55.7 kg), SpO2 100 %.  Physical Exam  Constitutional: He is oriented to person, place, and time. He appears well-developed and well-nourished. No distress.  HENT:  Head: Normocephalic and atraumatic.  Mouth/Throat: Oropharynx is clear and moist. No oropharyngeal exudate.  Short, brown graying hair and beard.  Temporal wasting.  Mask.  Eyes: Pupils are equal, round, and reactive to light. Conjunctivae and EOM are normal. No scleral icterus.  Blue eyes.  Neck: Normal range of motion. Neck supple. No JVD present.  Cardiovascular: Normal rate, regular rhythm and normal heart sounds. Exam reveals no gallop.  No murmur heard. Pulmonary/Chest: Effort normal and breath sounds normal. No respiratory distress. He has no wheezes. He has no rales.  Abdominal: Soft. Bowel sounds are normal. He exhibits no distension and no mass. There is no splenomegaly or hepatomegaly. There is no abdominal tenderness. There is no rebound and no guarding.  Musculoskeletal: Normal range of motion.        General: No edema.  Lymphadenopathy:    He has no cervical adenopathy.    He has no axillary adenopathy.       Right: No supraclavicular adenopathy present.       Left: No supraclavicular adenopathy present.   Neurological: He is alert and oriented to person, place, and time.  Skin: Skin is warm and dry. No rash noted. He is not diaphoretic. No erythema. No pallor.  Psychiatric: He has a normal mood and affect. His behavior is normal. Judgment and thought content normal.  Nursing note and vitals reviewed.   Appointment on 01/05/2019  Component Date Value Ref Range Status  . Sodium 01/05/2019 136  135 - 145 mmol/L Final  . Potassium 01/05/2019 3.2* 3.5 - 5.1 mmol/L Final  . Chloride 01/05/2019 101  98 - 111 mmol/L Final  . CO2 01/05/2019 25  22 - 32 mmol/L Final  . Glucose, Bld 01/05/2019 132* 70 - 99 mg/dL Final  . BUN 01/05/2019 12  8 - 23 mg/dL Final  . Creatinine, Ser 01/05/2019 0.67  0.61 - 1.24 mg/dL Final  . Calcium 01/05/2019 9.2  8.9 - 10.3 mg/dL Final  . Total Protein 01/05/2019 7.5  6.5 - 8.1 g/dL Final  . Albumin  01/05/2019 3.7  3.5 - 5.0 g/dL Final  . AST 01/05/2019 52* 15 - 41 U/L Final  . ALT 01/05/2019 54* 0 - 44 U/L Final  . Alkaline Phosphatase 01/05/2019 159* 38 - 126 U/L Final  . Total Bilirubin 01/05/2019 0.7  0.3 - 1.2 mg/dL Final  . GFR calc non Af Amer 01/05/2019 >60  >60 mL/min Final  . GFR calc Af Amer 01/05/2019 >60  >60 mL/min Final  . Anion gap 01/05/2019 10  5 - 15 Final   Performed at [facility], [address] [place], [place] [address]    Assessment:  Aoi Frehner is a 63 y.o. male  with unresectable hepatocellular carcinoma. He has a history of hepatitis C(treated with Harvoni in 04/11/2015). He has hepatitis B. Mass was discovered incidentally on low dose chest CT screenig program. AFPwas 7329. Child-Pugh ClassisA (score 5).  Low dose chest CTon 06/18/2018 revealed Lung-RADS 4A-S, suspicious. There was a new centrilobular nodularity in both lungs, more likely inflammatory. Three was a 8.5 x 5.7 cm poorly marginated geographic region of low attenuation in the superior right liver, new since 06/17/2017. Chest CTon 09/16/2018  revealed aLung-RADSscore of2, benign appearance or behavior. There was an ill-defined liver mass felt to represent a hepatoma on 07/10/2018, in the setting of cirrhosis.  Abdomen MRI on 07/10/2018 revealed an 8.8 cm heterogeneous infiltrating segment 8. Findings were consistent with a hepatoma, given the underlying cirrhotic changes involving the liver. This was also invading and obstructing the middle and right portal veins and the middle hepatic vein. There were scattered abdominal lymph nodes but no definite findings for metastatic disease.  AFPhas been followed: [id number] on 07/27/2018,6250 on 08/17/2018, 467 on 11/18/2018, and 271 on 12/29/2018.  He began lenvatinibon 08/25/2018.  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 There was no evidence of new liver lesions or abdominal metastasis.  He has a history of hypercalcemia. Calcium was 11.0 on 08/17/2018. PTH and PTH-rp were low on 08/19/2018.Calcium was 9.3 on 11/30/2018.  Symptomatically, he is doing much better.  He is eating better.  Weight is back to baseline.  He is tolerating oral potasium.  Exam is stable.  Plan: 1.   Labs today: CMP. 2. Hepatocellular carcinoma Clinically, he is doing well.    AFP is decreased from 6250 to 71.    Abdominal MRI on 11/28/2018 revealed a response to therapy.    He is tolerating lenvatinib.     Nausea is well controlled.    Continue to monitor closely.    Next surveillance imaging will plan for 02/28/2019. 3. Hypertension Blood pressure 115/79. He has baseline hypertension.    Lenvatinib has exacerbated baseline hypertension. Hypertension under good control with HCTZ 25 mg a day and amlodipine 10 mg a day.                         HCTZ has caused issues with hypokalemia. Continue to  monitor. 4. Electrolyte issues Sodium 136. Potassium3.2. Calcium remains low secondary to HCTZ. He initially had difficulty swallowing potassium pills.    He is taking his potassium pills with applesauce and tolerating well. Patient to take potassium po twice daily x 2 days then continue 1 tablet a day. 5.Weight loss             Patient back to baseline weight with control of nausea.  Continue to monitor.  6.   RTC in 1 week for BMP. 7.   RTC in 4 weeks for MD assessment, labs (CBC with diff, CMP, Mg, AFP).  I discussed the assessment and treatment plan with the patient.  The patient was provided an opportunity to ask questions and all were answered.  The patient agreed with the plan and demonstrated an understanding of the instructions.  The patient was advised to call back if the symptoms worsen or if the condition fails to improve as anticipated.  I provided 9 minutes of face-to-face time during this this encounter and > 50% was spent counseling as documented under my assessment and plan.    Lequita Asal, MD, PhD    01/05/2019, 11:15 AM  I, Selena Batten, am acting as scribe for [employer]. Mike Gip, MD, PhD.  I, Melissa C. Mike Gip, MD, have reviewed the above documentation for accuracy and completeness, and I agree with the above.

## 2019-01-05 ENCOUNTER — Inpatient Hospital Stay: Payer: Managed Care, Other (non HMO)

## 2019-01-05 ENCOUNTER — Inpatient Hospital Stay (HOSPITAL_BASED_OUTPATIENT_CLINIC_OR_DEPARTMENT_OTHER): Payer: Managed Care, Other (non HMO) | Admitting: Hematology and Oncology

## 2019-01-05 ENCOUNTER — Other Ambulatory Visit: Payer: Self-pay

## 2019-01-05 ENCOUNTER — Encounter: Payer: Self-pay | Admitting: Hematology and Oncology

## 2019-01-05 VITALS — BP 115/79 | HR 71 | Temp 97.0°F | Resp 18 | Ht 67.0 in | Wt 122.8 lb

## 2019-01-05 DIAGNOSIS — C220 Liver cell carcinoma: Secondary | ICD-10-CM

## 2019-01-05 DIAGNOSIS — R634 Abnormal weight loss: Secondary | ICD-10-CM

## 2019-01-05 DIAGNOSIS — E876 Hypokalemia: Secondary | ICD-10-CM

## 2019-01-05 DIAGNOSIS — Z7189 Other specified counseling: Secondary | ICD-10-CM | POA: Diagnosis not present

## 2019-01-05 LAB — COMPREHENSIVE METABOLIC PANEL
ALT: 54 U/L — ABNORMAL HIGH (ref 0–44)
AST: 52 U/L — ABNORMAL HIGH (ref 15–41)
Albumin: 3.7 g/dL (ref 3.5–5.0)
Alkaline Phosphatase: 159 U/L — ABNORMAL HIGH (ref 38–126)
Anion gap: 10 (ref 5–15)
BUN: 12 mg/dL (ref 8–23)
CO2: 25 mmol/L (ref 22–32)
Calcium: 9.2 mg/dL (ref 8.9–10.3)
Chloride: 101 mmol/L (ref 98–111)
Creatinine, Ser: 0.67 mg/dL (ref 0.61–1.24)
GFR calc Af Amer: >60 mL/min (ref >60)
GFR calc non Af Amer: >60 mL/min (ref >60)
Glucose, Bld: 132 mg/dL — ABNORMAL HIGH (ref 70–99)
Potassium: 3.2 mmol/L — ABNORMAL LOW (ref 3.5–5.1)
Sodium: 136 mmol/L (ref 135–145)
Total Bilirubin: 0.7 mg/dL (ref 0.3–1.2)
Total Protein: 7.5 g/dL (ref 6.5–8.1)

## 2019-01-05 MED ORDER — POTASSIUM CHLORIDE CRYS ER 20 MEQ PO TBCR: 20.0000 meq | EXTENDED_RELEASE_TABLET | Freq: Every day | ORAL | 0 refills | Status: DC

## 2019-01-05 NOTE — Progress Notes (Signed)
No new changes noted today. The patient does not c/o Nausea and vomiting today

## 2019-01-12 ENCOUNTER — Other Ambulatory Visit: Payer: Self-pay

## 2019-01-12 ENCOUNTER — Inpatient Hospital Stay: Payer: Managed Care, Other (non HMO) | Attending: Hematology and Oncology

## 2019-01-12 DIAGNOSIS — C220 Liver cell carcinoma: Secondary | ICD-10-CM | POA: Insufficient documentation

## 2019-01-12 LAB — BASIC METABOLIC PANEL
Anion gap: 9 (ref 5–15)
BUN: 25 mg/dL — ABNORMAL HIGH (ref 8–23)
CO2: 24 mmol/L (ref 22–32)
Calcium: 9.4 mg/dL (ref 8.9–10.3)
Chloride: 103 mmol/L (ref 98–111)
Creatinine, Ser: 0.75 mg/dL (ref 0.61–1.24)
GFR calc Af Amer: >60 mL/min (ref >60)
GFR calc non Af Amer: >60 mL/min (ref >60)
Glucose, Bld: 95 mg/dL (ref 70–99)
Potassium: 3.7 mmol/L (ref 3.5–5.1)
Sodium: 136 mmol/L (ref 135–145)

## 2019-02-07 ENCOUNTER — Other Ambulatory Visit: Payer: Self-pay | Admitting: Hematology and Oncology

## 2019-02-07 DIAGNOSIS — E876 Hypokalemia: Secondary | ICD-10-CM

## 2019-02-07 NOTE — Progress Notes (Signed)
[facility]  [address], [address] [place], [place] [address] Phone: [phone]  Fax: [phone]   Clinic Day:  02/08/2019  Referring physician: Donnie Coffin, MD  Chief Complaint: Chris Mcdonald is a 63 y.o. male with a history of hepatitis C and hepatocellular carcinoma who is seen for a 1 month assessment onlenvatinib.   HPI: The patient was last seen in the medical oncology clinic on 01/05/2019. At that time, he was doing much better.  He was eating better.  Weight was back to baseline.  He was tolerating oral potasium.  Exam was stable. His potassium increased to 3.2.  Follow-up potassium on 01/12/2019 was 3.7.  During the interim, he has had "some good days and some bad days". He feels fatigued and achy; he has had difficult ambulating. He is eating well.   He drinks Boost 2 to 3 times per day. He has missed some days of his potassium, but he is not sure how many. He has not taken potassium in the last two days. He has not taken his lenvatinib since 12/27/2018. He said that he hasn't heard from the pharmasist about having it sent.    Past Medical History:  Diagnosis Date  . Arthritis   . Hepatitis   . Hypertension   . Spinal stenosis   . Stroke [facility])     Past Surgical History:  Procedure Laterality Date  . COLONOSCOPY WITH PROPOFOL N/A 02/21/2015   Procedure: COLONOSCOPY WITH PROPOFOL;  Surgeon: Lollie Sails, MD;  Location: [facility] ENDOSCOPY;  Service: Endoscopy;  Laterality: N/A;  . INGUINAL HERNIA REPAIR Bilateral   . IR RADIOLOGIST EVAL & MGMT  08/04/2018  . POSTERIOR CERVICAL VERTEBRAE EXCISION      Family History  Problem Relation Age of Onset  . Cancer Brother   . Heart disease Brother     Social History:  reports that he has been smoking cigarettes. He has a 75.00 pack-year smoking history. He has never used smokeless tobacco. He reports that he does not drink alcohol or use drugs.  He lives in [place] with his wife and  daughter.He has a 3 year old daughter, 42 year old son, and 51 year old son. The patient is alone today.  Allergies: No Known Allergies  Current Medications: Current Outpatient Medications  Medication Sig Dispense Refill  . amLODipine (NORVASC) 5 MG tablet Take 10 mg by mouth daily.     Marland Kitchen aspirin EC 81 MG tablet Take 81 mg by mouth daily.     . hydrochlorothiazide (HYDRODIURIL) 12.5 MG tablet Take 25 mg by mouth daily.     Marland Kitchen KLOR-CON M20 20 MEQ tablet TAKE 1 TABLET (20 MEQ TOTAL) BY MOUTH DAILY. AS DIRECTED BY MD 40 tablet 0  . lenvatinib 8 mg daily dose (LENVIMA, 8 MG DAILY DOSE,) 2 x 4 MG capsule Take 2 capsules (8 mg total) by mouth daily. (Patient not taking: Reported on 02/08/2019) 60 capsule 0  . ondansetron (ZOFRAN) 4 MG tablet Take 1 tablet (4 mg total) by mouth every 8 (eight) hours as needed for nausea or vomiting. (Patient not taking: Reported on 01/05/2019) 20 tablet 0   No current facility-administered medications for this visit.     Review of Systems  Constitutional: Positive for malaise/fatigue. Negative for chills, diaphoresis, fever and weight loss (up 7 pounds).       Feels "pretty good".  HENT: Negative.  Negative for congestion, ear pain, hearing loss, nosebleeds, sinus pain and sore throat.  Eyes: Negative.  Negative for blurred vision, double vision, photophobia and pain.  Respiratory: Negative for cough, sputum production, shortness of breath and wheezing.   Cardiovascular: Negative.  Negative for chest pain, palpitations, orthopnea, leg swelling and PND.  Gastrointestinal: Positive for abdominal pain (RUQ, intermittent). Negative for blood in stool, constipation, diarrhea, heartburn, melena, nausea (using ondansetron prn) and vomiting.       Dysphagia.  Eating well.  Genitourinary: Negative.  Negative for dysuria, frequency, hematuria and urgency.  Musculoskeletal: Positive for joint pain (secondary to arthritis). Negative for back pain, myalgias and neck pain.        Steel rods in neck.  Skin: Negative.  Negative for itching and rash.  Neurological: Negative for dizziness, sensory change, focal weakness, weakness (generalized weakness) and headaches.       S/p left sided CVA.  Endo/Heme/Allergies: Negative.  Does not bruise/bleed easily.  Psychiatric/Behavioral: Negative for depression and memory loss. The patient is not nervous/anxious and does not have insomnia.   All other systems reviewed and are negative.  Performance status (ECOG):  1  Vitals Blood pressure 131/86, pulse 66, temperature (!) 97.2 F (36.2 C), temperature source Tympanic, resp. rate 18, weight 129 lb 10.1 oz (58.8 kg), SpO2 100 %.  Physical Exam  Constitutional: He is oriented to person, place, and time. He appears well-developed and well-nourished. No distress.  HENT:  Head: Normocephalic and atraumatic.  Mouth/Throat: Oropharynx is clear and moist. No oropharyngeal exudate.  Short, brown graying hair and beard.  Temporal wasting.  Mask.  Eyes: Pupils are equal, round, and reactive to light. Conjunctivae and EOM are normal. No scleral icterus.  Blue eyes.  Neck: Normal range of motion. Neck supple. No JVD present.  Cardiovascular: Normal rate, regular rhythm and normal heart sounds. Exam reveals no gallop.  No murmur heard. Pulmonary/Chest: Effort normal and breath sounds normal. No respiratory distress. He has no wheezes. He has no rales.  Abdominal: Soft. Bowel sounds are normal. He exhibits no distension and no mass. There is no splenomegaly or hepatomegaly. There is abdominal tenderness (slight RUQ). There is no rebound and no guarding.  Musculoskeletal: Normal range of motion.        General: No edema.  Lymphadenopathy:       Head (right side): No preauricular, no posterior auricular and no occipital adenopathy present.       Head (left side): No preauricular, no posterior auricular and no occipital adenopathy present.    He has no cervical adenopathy.    He has no  axillary adenopathy.       Right: No inguinal and no supraclavicular adenopathy present.       Left: No inguinal and no supraclavicular adenopathy present.  Neurological: He is alert and oriented to person, place, and time.  Skin: Skin is warm. No rash noted. He is not diaphoretic. No erythema. No pallor.  Psychiatric: He has a normal mood and affect. His behavior is normal. Judgment and thought content normal.  Nursing note and vitals reviewed.   Appointment on 02/08/2019  Component Date Value Ref Range Status  . Sodium 02/08/2019 130* 135 - 145 mmol/L Final  . Potassium 02/08/2019 2.6* 3.5 - 5.1 mmol/L Final   Comment: CRITICAL RESULT CALLED TO, READ BACK BY AND VERIFIED WITH MICHELLE BRUNER 0900 02/08/19 CVP   . Chloride 02/08/2019 97* 98 - 111 mmol/L Final  . CO2 02/08/2019 23  22 - 32 mmol/L Final  . Glucose, Bld 02/08/2019 110* 70 - 99 mg/dL Final  .  BUN 02/08/2019 16  8 - 23 mg/dL Final  . Creatinine, Ser 02/08/2019 0.82  0.61 - 1.24 mg/dL Final  . Calcium 02/08/2019 9.2  8.9 - 10.3 mg/dL Final  . Total Protein 02/08/2019 8.3* 6.5 - 8.1 g/dL Final  . Albumin 02/08/2019 4.0  3.5 - 5.0 g/dL Final  . AST 02/08/2019 50* 15 - 41 U/L Final  . ALT 02/08/2019 39  0 - 44 U/L Final  . Alkaline Phosphatase 02/08/2019 201* 38 - 126 U/L Final  . Total Bilirubin 02/08/2019 0.5  0.3 - 1.2 mg/dL Final  . GFR calc non Af Amer 02/08/2019 >60  >60 mL/min Final  . GFR calc Af Amer 02/08/2019 >60  >60 mL/min Final  . Anion gap 02/08/2019 10  5 - 15 Final   Performed at [facility], [address], [place], [place] [address]  . Magnesium 02/08/2019 2.1  1.7 - 2.4 mg/dL Final   Performed at [facility], [address], [place], [place] [address]  . WBC 02/08/2019 5.7  4.0 - 10.5 K/uL Final  . RBC 02/08/2019 4.42  4.22 - 5.81 MIL/uL Final  . Hemoglobin 02/08/2019 14.1  13.0 - 17.0 g/dL Final  . HCT 02/08/2019 41.5  39.0 - 52.0 % Final  . MCV 02/08/2019 93.9  80.0 -  100.0 fL Final  . MCH 02/08/2019 31.9  26.0 - 34.0 pg Final  . MCHC 02/08/2019 34.0  30.0 - 36.0 g/dL Final  . RDW 02/08/2019 13.6  11.5 - 15.5 % Final  . Platelets 02/08/2019 205  150 - 400 K/uL Final  . nRBC 02/08/2019 0.0  0.0 - 0.2 % Final  . Neutrophils Relative % 02/08/2019 68  % Final  . Neutro Abs 02/08/2019 3.8  1.7 - 7.7 K/uL Final  . Lymphocytes Relative 02/08/2019 21  % Final  . Lymphs Abs 02/08/2019 1.2  0.7 - 4.0 K/uL Final  . Monocytes Relative 02/08/2019 7  % Final  . Monocytes Absolute 02/08/2019 0.4  0.1 - 1.0 K/uL Final  . Eosinophils Relative 02/08/2019 3  % Final  . Eosinophils Absolute 02/08/2019 0.2  0.0 - 0.5 K/uL Final  . Basophils Relative 02/08/2019 1  % Final  . Basophils Absolute 02/08/2019 0.0  0.0 - 0.1 K/uL Final  . Immature Granulocytes 02/08/2019 0  % Final  . Abs Immature Granulocytes 02/08/2019 0.02  0.00 - 0.07 K/uL Final   Performed at [facility], [address] [place], [place] [address]    Assessment:  Chris Mcdonald is a 63 y.o. male  with unresectable hepatocellular carcinoma. He has a history of hepatitis C(treated with Harvoni in 04/11/2015). He has hepatitis B. Mass was discovered incidentally on low dose chest CT screenig program. AFPwas 7329. Child-Pugh ClassisA (score 5).  Low dose chest CTon 06/18/2018 revealed Lung-RADS 4A-S, suspicious. There was a new centrilobular nodularity in both lungs, more likely inflammatory. Three was a 8.5 x 5.7 cm poorly marginated geographic region of low attenuation in the superior right liver, new since 06/17/2017. Chest CTon 09/16/2018 revealed aLung-RADSscore of2, benign appearance or behavior. There was an ill-defined liver mass felt to represent a hepatoma on 07/10/2018, in the setting of cirrhosis.  Abdomen MRI on 07/10/2018 revealed an 8.8 cm heterogeneous infiltrating segment 8. Findings were consistent with a hepatoma, given the underlying cirrhotic changes involving  the liver. This was also invading and obstructing the middle and right portal veins and the middle hepatic vein. There were scattered abdominal lymph  nodes but no definite findings for metastatic disease.  AFPhas been followed: [id number] on 07/27/2018,6250 on 08/17/2018, 467 on 11/18/2018, and 271 on 12/29/2018.  He began lenvatinibon 08/25/2018.  He has been off his lenvatinib since 12/27/2018.  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 There was no evidence of new liver lesions or abdominal metastasis.  He has a history of hypercalcemia. Calcium was 11.0 on 08/17/2018. PTH and PTH-rp were low on 08/19/2018.Calcium was 9.3 on 11/30/2018.  Symptomatically, he is doing fair.  He feels achy.  He has not taken lenvatinib since 12/27/2018.  Plan: 1.   Labs today: CBC with diff, CMP, Mg, AFP. 2. Hepatocellular carcinoma Clinically, he is doing fair.    AFP is decreased to 271 last month.    Abdominal MRI on 11/28/2018 revealed a response to therapy.    He has been off his lenvatinib.   Contact Nuala Alpha, oral chemotherapy pharmacist, to assist with obtaining lenvatinib.  Continue to monitor closely.    Anticipate next surveillance scan around 03/04/2019. 3. Hypertension Blood pressure 131/86. He has baseline hypertension.    Lenvatinib has exacerbated baseline hypertension in the past. Hypertension under control with HCTZ 25 mg a day and amlodipine 10 mg a day.                         HCTZ has caused ongoing issues with hypokalemia. Continue top monitor. 4. Electrolyte issues Sodium 130. Potassium2.6. Potassium continues to remain low secondary to HCTZ.  He is able to swallow his potassium pills, but has missed some.  Review importance of taking oral  potassium. Potassium 40 meq IV over 4 hours today.  Patient to take potassium 20 meq po q day. 5.Weight loss             Weight up 7 pounds.  No issues with nausea and vomiting.  Continue to monitor. 6.   RTC in 1 week for labs (BMP). 7.   RTC in 1 month for MD assessment and labs (CBC with diff, CMP, AFP).  I discussed the assessment and treatment plan with the patient.  The patient was provided an opportunity to ask questions and all were answered.  The patient agreed with the plan and demonstrated an understanding of the instructions.  The patient was advised to call back if the symptoms worsen or if the condition fails to improve as anticipated.   Lequita Asal, MD, PhD    02/08/2019, 9:15 AM  I, Jacqualyn Posey, am acting as a Education administrator for [employer]. Mike Gip, MD.   I,  C. Mike Gip, MD, have reviewed the above documentation for accuracy and completeness, and I agree with the above.

## 2019-02-08 ENCOUNTER — Inpatient Hospital Stay: Payer: Managed Care, Other (non HMO) | Attending: Hematology and Oncology | Admitting: Hematology and Oncology

## 2019-02-08 ENCOUNTER — Encounter: Payer: Self-pay | Admitting: Hematology and Oncology

## 2019-02-08 ENCOUNTER — Inpatient Hospital Stay: Payer: Managed Care, Other (non HMO)

## 2019-02-08 ENCOUNTER — Telehealth: Payer: Self-pay | Admitting: *Deleted

## 2019-02-08 ENCOUNTER — Other Ambulatory Visit: Payer: Self-pay

## 2019-02-08 VITALS — BP 131/86 | HR 66 | Temp 97.2°F | Resp 18 | Wt 129.6 lb

## 2019-02-08 DIAGNOSIS — Z8673 Personal history of transient ischemic attack (TIA), and cerebral infarction without residual deficits: Secondary | ICD-10-CM | POA: Insufficient documentation

## 2019-02-08 DIAGNOSIS — R634 Abnormal weight loss: Secondary | ICD-10-CM

## 2019-02-08 DIAGNOSIS — Z7982 Long term (current) use of aspirin: Secondary | ICD-10-CM | POA: Insufficient documentation

## 2019-02-08 DIAGNOSIS — I10 Essential (primary) hypertension: Secondary | ICD-10-CM | POA: Diagnosis not present

## 2019-02-08 DIAGNOSIS — E876 Hypokalemia: Secondary | ICD-10-CM | POA: Insufficient documentation

## 2019-02-08 DIAGNOSIS — F17210 Nicotine dependence, cigarettes, uncomplicated: Secondary | ICD-10-CM | POA: Diagnosis not present

## 2019-02-08 DIAGNOSIS — Z79899 Other long term (current) drug therapy: Secondary | ICD-10-CM | POA: Insufficient documentation

## 2019-02-08 DIAGNOSIS — C220 Liver cell carcinoma: Secondary | ICD-10-CM | POA: Diagnosis present

## 2019-02-08 LAB — CBC WITH DIFFERENTIAL/PLATELET
Abs Immature Granulocytes: 0.02 10*3/uL (ref 0.00–0.07)
Basophils Absolute: 0 10*3/uL (ref 0.0–0.1)
Basophils Relative: 1 %
Eosinophils Absolute: 0.2 10*3/uL (ref 0.0–0.5)
Eosinophils Relative: 3 %
HCT: 41.5 % (ref 39.0–52.0)
Hemoglobin: 14.1 g/dL (ref 13.0–17.0)
Immature Granulocytes: 0 %
Lymphocytes Relative: 21 %
Lymphs Abs: 1.2 10*3/uL (ref 0.7–4.0)
MCH: 31.9 pg (ref 26.0–34.0)
MCHC: 34 g/dL (ref 30.0–36.0)
MCV: 93.9 fL (ref 80.0–100.0)
Monocytes Absolute: 0.4 10*3/uL (ref 0.1–1.0)
Monocytes Relative: 7 %
Neutro Abs: 3.8 10*3/uL (ref 1.7–7.7)
Neutrophils Relative %: 68 %
Platelets: 205 10*3/uL (ref 150–400)
RBC: 4.42 MIL/uL (ref 4.22–5.81)
RDW: 13.6 % (ref 11.5–15.5)
WBC: 5.7 10*3/uL (ref 4.0–10.5)
nRBC: 0 % (ref 0.0–0.2)

## 2019-02-08 LAB — COMPREHENSIVE METABOLIC PANEL
ALT: 39 U/L (ref 0–44)
AST: 50 U/L — ABNORMAL HIGH (ref 15–41)
Albumin: 4 g/dL (ref 3.5–5.0)
Alkaline Phosphatase: 201 U/L — ABNORMAL HIGH (ref 38–126)
Anion gap: 10 (ref 5–15)
BUN: 16 mg/dL (ref 8–23)
CO2: 23 mmol/L (ref 22–32)
Calcium: 9.2 mg/dL (ref 8.9–10.3)
Chloride: 97 mmol/L — ABNORMAL LOW (ref 98–111)
Creatinine, Ser: 0.82 mg/dL (ref 0.61–1.24)
GFR calc Af Amer: >60 mL/min (ref >60)
GFR calc non Af Amer: >60 mL/min (ref >60)
Glucose, Bld: 110 mg/dL — ABNORMAL HIGH (ref 70–99)
Potassium: 2.6 mmol/L — CL (ref 3.5–5.1)
Sodium: 130 mmol/L — ABNORMAL LOW (ref 135–145)
Total Bilirubin: 0.5 mg/dL (ref 0.3–1.2)
Total Protein: 8.3 g/dL — ABNORMAL HIGH (ref 6.5–8.1)

## 2019-02-08 LAB — MAGNESIUM: Magnesium: 2.1 mg/dL (ref 1.7–2.4)

## 2019-02-08 MED ORDER — SODIUM CHLORIDE 0.9 % IV SOLN
Freq: Once | INTRAVENOUS | Status: AC
  Administered 2019-02-08: 10:00:00 via INTRAVENOUS
  Filled 2019-02-08: qty 250

## 2019-02-08 MED ORDER — LENVIMA (8 MG DAILY DOSE) 2 X 4 MG PO CPPK: 8.0000 mg | ORAL_CAPSULE | Freq: Every day | ORAL | 0 refills | Status: DC

## 2019-02-08 MED ORDER — SODIUM CHLORIDE 0.9 % IV SOLN
40.0000 meq | Freq: Once | INTRAVENOUS | Status: AC
  Administered 2019-02-08: 40 meq via INTRAVENOUS
  Filled 2019-02-08: qty 20

## 2019-02-08 NOTE — Telephone Encounter (Signed)
Received T/C from [place] in [facility] with critical potassium level of 2.6 ( Read back). Immediately informed Dr Mike Gip of level.

## 2019-02-08 NOTE — Patient Instructions (Signed)
Potassium chloride injection What is this medicine? POTASSIUM CHLORIDE (poe TASS i um KLOOR ide) is a potassium supplement used to prevent and to treat low potassium. Potassium is important for the heart, muscles, and nerves. Too much or too little potassium in the body can cause serious problems. This medicine may be used for other purposes; ask your health care provider or pharmacist if you have questions. COMMON BRAND NAME(S): PROAMP What should I tell my health care provider before I take this medicine? They need to know if you have any of these conditions:  Addison's disease  dehydration  diabetes  heart disease  high levels of potassium in the blood  irregular heartbeat  kidney disease  recent severe burn  an unusual or allergic reaction to potassium, other medicines, foods, dyes, or preservatives  pregnant or trying to get pregnant  breast-feeding How should I use this medicine? This medicine is for infusion into a vein. It is given by a health care professional in a hospital or clinic setting. Talk to your pediatrician regarding the use of this medicine in children. Special care may be needed. Overdosage: If you think you have taken too much of this medicine contact a poison control center or emergency room at once. NOTE: This medicine is only for you. Do not share this medicine with others. What if I miss a dose? This does not apply. What may interact with this medicine? Do not take this medicine with any of the following medications:  certain diuretics such as spironolactone, triamterene  eplerenone  sodium polystyrene sulfonate This medicine may also interact with the following medications:  certain medicines for blood pressure or heart disease like lisinopril, losartan, quinapril, valsartan  medicines that lower your chance of fighting infection such as cyclosporine, tacrolimus  NSAIDs, medicines for pain and inflammation, like ibuprofen or  naproxen  other potassium supplements  salt substitutes This list may not describe all possible interactions. Give your health care provider a list of all the medicines, herbs, non-prescription drugs, or dietary supplements you use. Also tell them if you smoke, drink alcohol, or use illegal drugs. Some items may interact with your medicine. What should I watch for while using this medicine? Your condition will be monitored carefully while you are receiving this medicine. You may need blood work done while you are taking this medicine. What side effects may I notice from receiving this medicine? Side effects that you should report to your doctor or health care professional as soon as possible:  allergic reactions like skin rash, itching or hives, swelling of the face, lips, or tongue  breathing problems  confusion  fast, irregular heartbeat  feeling faint or lightheaded, falls  low blood pressure  numbness or tingling in hands or feet  pain, redness, or irritation at site where injected  unusually weak or tired  weakness, heaviness of legs Side effects that usually do not require medical attention (report to your doctor or health care professional if they continue or are bothersome):  diarrhea  nausea, vomiting  stomach pain This list may not describe all possible side effects. Call your doctor for medical advice about side effects. You may report side effects to FDA at 1-800-FDA-1088. Where should I keep my medicine? This drug is given in a hospital or clinic and will not be stored at home. NOTE: This sheet is a summary. It may not cover all possible information. If you have questions about this medicine, talk to your doctor, pharmacist, or health care provider.    2020 Elsevier/Gold Standard (2016-01-10 13:59:20)  

## 2019-02-08 NOTE — Progress Notes (Signed)
Patient here for follow up. Denies any concerns.  

## 2019-02-09 ENCOUNTER — Telehealth: Payer: Self-pay

## 2019-02-09 DIAGNOSIS — C220 Liver cell carcinoma: Secondary | ICD-10-CM

## 2019-02-09 LAB — AFP TUMOR MARKER: AFP, Serum, Tumor Marker: 2320 ng/mL — ABNORMAL HIGH (ref 0.0–8.3)

## 2019-02-09 NOTE — Telephone Encounter (Signed)
Spoke with Chris Mcdonald to see if he contact the pharmancy for shipment for his medication. The patient had misplaced the number. I was ab;le to give it to him again.The patient states he will call now to get the medication shipped to him. The patient was understanding and agreeable.

## 2019-02-15 ENCOUNTER — Other Ambulatory Visit: Payer: Self-pay

## 2019-02-15 ENCOUNTER — Telehealth: Payer: Self-pay

## 2019-02-15 ENCOUNTER — Inpatient Hospital Stay: Payer: Managed Care, Other (non HMO)

## 2019-02-15 DIAGNOSIS — C220 Liver cell carcinoma: Secondary | ICD-10-CM | POA: Diagnosis not present

## 2019-02-15 LAB — BASIC METABOLIC PANEL
Anion gap: 10 (ref 5–15)
BUN: 12 mg/dL (ref 8–23)
CO2: 24 mmol/L (ref 22–32)
Calcium: 9.5 mg/dL (ref 8.9–10.3)
Chloride: 102 mmol/L (ref 98–111)
Creatinine, Ser: 0.84 mg/dL (ref 0.61–1.24)
GFR calc Af Amer: >60 mL/min (ref >60)
GFR calc non Af Amer: >60 mL/min (ref >60)
Glucose, Bld: 92 mg/dL (ref 70–99)
Potassium: 3.1 mmol/L — ABNORMAL LOW (ref 3.5–5.1)
Sodium: 136 mmol/L (ref 135–145)

## 2019-02-15 NOTE — Telephone Encounter (Signed)
Spoke with the patient to see if he currently take potassium. The patient reports yes he is currently taken 20 meq of potassium daily. And yes he did receive the Lenvatinib. The patient was understanding and agreeable with his results.

## 2019-02-15 NOTE — Telephone Encounter (Signed)
-----   Message from Lequita Asal, MD sent at 02/15/2019  4:30 PM EDT ----- Regarding: RE: Please call patient  Please ask patient to take potassium twice a day for 3 days then back to once a day.  M ----- Message ----- From: Vito Berger, [place] Sent: 02/15/2019   3:51 PM EDT To: Lequita Asal, MD Subject: RE: Please call patient                        The patient states he is currently taken 20 meq of potassium daily and yes he did receive his Lenvatinib. ----- Message ----- From: Lequita Asal, MD Sent: 02/15/2019  12:58 PM EDT To: Vito Berger, CMA Subject: Please call patient                             How much potassium is he taking?  Potassium is low (3.1).  Did he obtain the lenvatinib?  M   ----- Message ----- From: Buel Ream, [facility] [place] Sent: 02/15/2019   9:47 AM EDT To: Lequita Asal, MD

## 2019-02-15 NOTE — Telephone Encounter (Signed)
-----   Message from Lequita Asal, MD sent at 02/15/2019 12:58 PM EDT ----- Regarding: Please call patient  How much potassium is he taking?  Potassium is low (3.1).  Did he obtain the lenvatinib?  M   ----- Message ----- From: Buel Ream, [facility] [place] Sent: 02/15/2019   9:47 AM EDT To: Lequita Asal, MD

## 2019-02-15 NOTE — Telephone Encounter (Signed)
Spoke with the patient to inform him, Per Dr  Mike Gip she would like for him to take the potassium BID x 3 days and then back to daily. The patient was understanding and agreeable.

## 2019-02-24 ENCOUNTER — Telehealth: Payer: Self-pay

## 2019-02-24 ENCOUNTER — Other Ambulatory Visit: Payer: Self-pay

## 2019-02-24 ENCOUNTER — Other Ambulatory Visit: Payer: Self-pay | Admitting: Hematology and Oncology

## 2019-02-24 DIAGNOSIS — C220 Liver cell carcinoma: Secondary | ICD-10-CM

## 2019-02-24 MED ORDER — LENVIMA (8 MG DAILY DOSE) 2 X 4 MG PO CPPK: 8.0000 mg | ORAL_CAPSULE | Freq: Every day | ORAL | 0 refills | Status: DC

## 2019-02-24 NOTE — Telephone Encounter (Signed)
-----   Message from Lequita Asal, MD sent at 02/24/2019  6:37 AM EST ----- Regarding: Lenvatinib refill- refilled 60 pills on 10/19; Request in my box for 6 pills.  Does he need more lenvatinib?    A month's supply or only 6?  M

## 2019-02-24 NOTE — Telephone Encounter (Signed)
Refill Lenvatinib 8 mg daily dose # 60 mg 0 refills. The patient is aware.

## 2019-02-24 NOTE — Telephone Encounter (Signed)
Spoke with the patient to see if he need a  refill on Lenvatinib 8 mg daily. The patient states he may need a refill by the end of the month. I have sent an refill encounter over to Dr Mike Gip for the patient refill request.

## 2019-03-05 ENCOUNTER — Other Ambulatory Visit: Payer: Self-pay

## 2019-03-05 ENCOUNTER — Encounter: Payer: Self-pay | Admitting: Hematology and Oncology

## 2019-03-05 NOTE — Progress Notes (Signed)
The patient c/o whole body pain ( pain level 7). The patient that It is nothing new. The patient name and DOB has been verified by phone today.

## 2019-03-06 NOTE — Progress Notes (Signed)
[facility]  [address] [place], [place] [address] Phone: [phone]  Fax: [phone]   Clinic Day:  03/08/2019  Referring physician: Donnie Coffin, MD  Chief Complaint: Chris Mcdonald is a 63 y.o. male with with a history of hepatitis C and hepatocellular carcinoma on lenvatinib who is seen for 1 month assessment.   HPI: The patient was last seen in the medical oncology clinic on 02/08/2019. At that time, he was doing fair.  He felt achy.  He had not taken lenvatinib since 12/27/2018. AFP had increased to 2,320.  Sodium was 130 and potassium 2.6.  He received potassium 40 meq IV.  Potassium 20 meq po q day was started.  Sodium was 136 and potassium 3.1 on 02/15/2019.  He was contacted and told to take potassium 20 meq BID x 3 days then 20 meq a day.  During the interim, he has received his lenvatinib and only has 1 week left.  He denies any side effects.  He is taking his potassium as prescribed, he only has 1 week left as well.  He is taking his pills well with applesauce.    Symptomatically, he feels "all right".  He is no longer having any abdominal pain.  He is chronically fatigued which he feels is due to looking after his 67 year old son.   He is scheduled for an abdominal MRI on 03/15/2019.   Past Medical History:  Diagnosis Date  . Arthritis   . Hepatitis   . Hypertension   . Spinal stenosis   . Stroke [facility])     Past Surgical History:  Procedure Laterality Date  . COLONOSCOPY WITH PROPOFOL N/A 02/21/2015   Procedure: COLONOSCOPY WITH PROPOFOL;  Surgeon: Lollie Sails, MD;  Location: [facility] ENDOSCOPY;  Service: Endoscopy;  Laterality: N/A;  . INGUINAL HERNIA REPAIR Bilateral   . IR RADIOLOGIST EVAL & MGMT  08/04/2018  . POSTERIOR CERVICAL VERTEBRAE EXCISION      Family History  Problem Relation Age of Onset  . Cancer Brother   . Heart disease Brother     Social History:  reports that he has been smoking cigarettes. He  has a 75.00 pack-year smoking history. He has never used smokeless tobacco. He reports that he does not drink alcohol or use drugs. He lives in [place] with his wife and daughter.He has a 27 year old daughter, 11 year old son, and 14 year old son.The  The patient is alone today.  Allergies: No Known Allergies  Current Medications: Current Outpatient Medications  Medication Sig Dispense Refill  . amLODipine (NORVASC) 5 MG tablet Take 10 mg by mouth daily.     Marland Kitchen aspirin EC 81 MG tablet Take 81 mg by mouth daily.     . hydrochlorothiazide (HYDRODIURIL) 12.5 MG tablet Take 25 mg by mouth daily.     Marland Kitchen lenvatinib 8 mg daily dose (LENVIMA, 8 MG DAILY DOSE,) 2 x 4 MG capsule Take 2 capsules (8 mg total) by mouth daily. 60 capsule 0  . potassium chloride SA (KLOR-CON M20) 20 MEQ tablet TAKE 1 TABLET (20 MEQ TOTAL) BY MOUTH DAILY. AS DIRECTED BY MD 40 tablet 0  . ondansetron (ZOFRAN) 4 MG tablet Take 1 tablet (4 mg total) by mouth every 8 (eight) hours as needed for nausea or vomiting. (Patient not taking: Reported on 01/05/2019) 20 tablet 0   No current facility-administered medications for this visit.     Review of Systems  Constitutional: Positive for  malaise/fatigue (chronic) and weight loss (1 lb). Negative for chills, diaphoresis and fever.       Feels "good".  HENT: Negative.  Negative for congestion, ear pain, hearing loss, nosebleeds, sinus pain and sore throat.   Eyes: Negative.  Negative for blurred vision, double vision, photophobia and pain.  Respiratory: Negative.  Negative for cough, hemoptysis, sputum production, shortness of breath and wheezing.   Cardiovascular: Negative.  Negative for chest pain, palpitations, orthopnea, leg swelling and PND.  Gastrointestinal: Negative for abdominal pain (resolved), blood in stool, constipation, diarrhea, heartburn, melena, nausea (takes ondansetron prn) and vomiting.       Eating well.  Genitourinary: Negative.  Negative for dysuria, frequency,  hematuria and urgency.  Musculoskeletal: Positive for joint pain (secondary to arthritis). Negative for back pain, myalgias and neck pain.       Steel rods in neck.  Skin: Negative.  Negative for itching and rash.  Neurological: Positive for weakness (generalized). Negative for dizziness, sensory change, speech change, focal weakness and headaches.       S/p left sided CVA.  Endo/Heme/Allergies: Negative.  Does not bruise/bleed easily.  Psychiatric/Behavioral: Negative.  Negative for depression and memory loss. The patient is not nervous/anxious and does not have insomnia.   All other systems reviewed and are negative.  Performance status (ECOG):  1  Vitals Blood pressure 129/90, pulse 66, temperature (!) 97.1 F (36.2 C), temperature source Tympanic, resp. rate 18, height 5\' 7"  (1.702 m), weight 128 lb 15.5 oz (58.5 kg), SpO2 100 %.   Physical Exam  Constitutional: He is oriented to person, place, and time. He appears well-developed and well-nourished. No distress.  HENT:  Head: Normocephalic and atraumatic.  Mouth/Throat: Oropharynx is clear and moist. No oropharyngeal exudate.  Short, brown graying hair and full beard.  Edentulous.  Temporal wasting.  Mask.  Eyes: Pupils are equal, round, and reactive to light. Conjunctivae and EOM are normal. No scleral icterus.  Blue eyes.  Neck: Normal range of motion. Neck supple. No JVD present.  Cardiovascular: Normal rate, regular rhythm and normal heart sounds. Exam reveals no gallop.  No murmur heard. Pulmonary/Chest: Effort normal and breath sounds normal. No respiratory distress. He has no wheezes. He has no rales.  Abdominal: Soft. Bowel sounds are normal. He exhibits no distension and no mass. There is no splenomegaly or hepatomegaly. There is no abdominal tenderness. There is no rebound and no guarding.  Musculoskeletal: Normal range of motion.        General: No tenderness or edema.  Lymphadenopathy:       Head (right side): No  preauricular, no posterior auricular and no occipital adenopathy present.       Head (left side): No preauricular, no posterior auricular and no occipital adenopathy present.    He has no cervical adenopathy.    He has no axillary adenopathy.       Right: No supraclavicular adenopathy present.       Left: No supraclavicular adenopathy present.  Neurological: He is alert and oriented to person, place, and time.  Skin: Skin is warm and dry. No rash noted. He is not diaphoretic. No pallor.  Psychiatric: His behavior is normal. Judgment and thought content normal.  Nursing note and vitals reviewed.   Appointment on 03/08/2019  Component Date Value Ref Range Status  . Sodium 03/08/2019 131* 135 - 145 mmol/L Final  . Potassium 03/08/2019 3.4* 3.5 - 5.1 mmol/L Final  . Chloride 03/08/2019 98  98 - 111 mmol/L Final  .  CO2 03/08/2019 22  22 - 32 mmol/L Final  . Glucose, Bld 03/08/2019 117* 70 - 99 mg/dL Final  . BUN 03/08/2019 19  8 - 23 mg/dL Final  . Creatinine, Ser 03/08/2019 0.64  0.61 - 1.24 mg/dL Final  . Calcium 03/08/2019 9.4  8.9 - 10.3 mg/dL Final  . Total Protein 03/08/2019 7.6  6.5 - 8.1 g/dL Final  . Albumin 03/08/2019 4.1  3.5 - 5.0 g/dL Final  . AST 03/08/2019 65* 15 - 41 U/L Final  . ALT 03/08/2019 55* 0 - 44 U/L Final  . Alkaline Phosphatase 03/08/2019 160* 38 - 126 U/L Final  . Total Bilirubin 03/08/2019 0.8  0.3 - 1.2 mg/dL Final  . GFR calc non Af Amer 03/08/2019 >60  >60 mL/min Final  . GFR calc Af Amer 03/08/2019 >60  >60 mL/min Final  . Anion gap 03/08/2019 11  5 - 15 Final   Performed at [facility], [address] [place], [place] [address]  . WBC 03/08/2019 5.8  4.0 - 10.5 K/uL Final  . RBC 03/08/2019 5.26  4.22 - 5.81 MIL/uL Final  . Hemoglobin 03/08/2019 16.2  13.0 - 17.0 g/dL Final  . HCT 03/08/2019 45.9  39.0 - 52.0 % Final  . MCV 03/08/2019 87.3  80.0 - 100.0 fL Final  . MCH 03/08/2019 30.8  26.0 - 34.0 pg Final  . MCHC 03/08/2019 35.3  30.0  - 36.0 g/dL Final  . RDW 03/08/2019 12.2  11.5 - 15.5 % Final  . Platelets 03/08/2019 155  150 - 400 K/uL Final  . nRBC 03/08/2019 0.0  0.0 - 0.2 % Final  . Neutrophils Relative % 03/08/2019 65  % Final  . Neutro Abs 03/08/2019 3.8  1.7 - 7.7 K/uL Final  . Lymphocytes Relative 03/08/2019 25  % Final  . Lymphs Abs 03/08/2019 1.5  0.7 - 4.0 K/uL Final  . Monocytes Relative 03/08/2019 6  % Final  . Monocytes Absolute 03/08/2019 0.4  0.1 - 1.0 K/uL Final  . Eosinophils Relative 03/08/2019 3  % Final  . Eosinophils Absolute 03/08/2019 0.2  0.0 - 0.5 K/uL Final  . Basophils Relative 03/08/2019 1  % Final  . Basophils Absolute 03/08/2019 0.1  0.0 - 0.1 K/uL Final  . Immature Granulocytes 03/08/2019 0  % Final  . Abs Immature Granulocytes 03/08/2019 0.01  0.00 - 0.07 K/uL Final   Performed at [facility], [address], [place], [place] [address]    Assessment:  Chris Mcdonald is a 63 y.o. male with unresectable hepatocellular carcinoma. He has a history of hepatitis C(treated with Harvoni in 04/11/2015). He has hepatitis B. Mass was discovered incidentally on low dose chest CT screenig program. AFPwas 7329. Child-Pugh ClassisA (score 5).  Low dose chest CTon 06/18/2018 revealed Lung-RADS 4A-S, suspicious. There was a new centrilobular nodularity in both lungs, more likely inflammatory. Three was a 8.5 x 5.7 cm poorly marginated geographic region of low attenuation in the superior right liver, new since 06/17/2017. Chest CTon 09/16/2018 revealed aLung-RADSscore of2, benign appearance or behavior. There was an ill-defined liver mass felt to represent a hepatoma on 07/10/2018, in the setting of cirrhosis.  Abdomen MRI on 07/10/2018 revealed an 8.8 cm heterogeneous infiltrating segment 8. Findings were consistent with a hepatoma, given the underlying cirrhotic changes involving the liver. This was also invading and obstructing the middle and right portal veins and the  middle hepatic vein. There were scattered abdominal lymph nodes but no definite findings  for metastatic disease.  AFPhas been followed: [id number] on 07/27/2018,6250 on 08/17/2018, 467 on 11/18/2018, and 271 on 12/29/2018.  He began lenvatinibon 08/25/2018.  He has been off his lenvatinib since 12/27/2018.  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 There was no evidence of new liver lesions or abdominal metastasis.  He has a history of hypercalcemia. Calcium was 11.0 on 08/17/2018. PTH and PTH-rp were low on 08/19/2018.Calcium was 9.3 on 11/30/2018.  Symptomatically, he is doing well.  He has chronic mild fatigue.  Liver function tests have improved.  Potassium is 3.4..  Plan: 1.  Labs today: CBC with diff, CMP, AFP. 2. Hepatocellular carcinoma Clinically, he is doing better this month.    He denies any RUQ pain.             Liver function studies have improved.  AFP was elevated last month after not taking lenvatinib for > 1 month.                AFP pending today.  Abdominal MRI on 11/28/2018 revealed a response to therapy.                Abdominal MRI scheduled for 03/15/2019.   Call patient with results.             Contact Nuala Alpha, oral chemotherapy pharmacist, for refill of lenvatinib.   Refill lenvatinib. 3. Hypertension Blood pressure 129/90. He has baseline hypertension.               Lenvatinib has exacerbated baseline hypertension in the past. Hypertension under control withHCTZ 25 mg a day and amlodipine 10 mg a day. HCTZ results in hypokalemia. Continue to monitor. 4. Electrolyte issues Sodium 131. Potassium3.4. Potassium continues to remain low secondary to HCTZ.             He is able to swallow pills  with applesauce.             Patient to take potassium 20 meq po BID today only then continue potassium 20 meq beginning tomorrow.  Refill potassium 20 meq po q day (dis: #40).  Continue to monitor. 5.Weight loss Weight down 1 pound.             No issues with nausea and vomiting.             Continue to monitor. 6.   RTC in 1 month for MD assessment, labs (CBC with diff, CMP, AFP), and review of abdominal MRI.   I discussed the assessment and treatment plan with the patient.  The patient was provided an opportunity to ask questions and all were answered.  The patient agreed with the plan and demonstrated an understanding of the instructions.  The patient was advised to call back if the symptoms worsen or if the condition fails to improve as anticipated.  I provided 15 minutes (9:25 AM - 9:40 AM) of face-to-face time during this this encounter and > 50% was spent counseling as documented under my assessment and plan.    Lequita Asal, MD, PhD    03/08/2019, 9:40 AM  I, Samul Dada, am acting as a scribe for Lequita Asal, MD.  I, [place] Mike Gip, MD, have reviewed the above documentation for accuracy and completeness, and I agree with the above.

## 2019-03-08 ENCOUNTER — Inpatient Hospital Stay: Payer: Managed Care, Other (non HMO)

## 2019-03-08 ENCOUNTER — Other Ambulatory Visit: Payer: Self-pay

## 2019-03-08 ENCOUNTER — Inpatient Hospital Stay: Payer: Managed Care, Other (non HMO) | Attending: Hematology and Oncology | Admitting: Hematology and Oncology

## 2019-03-08 ENCOUNTER — Encounter: Payer: Self-pay | Admitting: Hematology and Oncology

## 2019-03-08 VITALS — BP 129/90 | HR 66 | Temp 97.1°F | Resp 18 | Ht 67.0 in | Wt 129.0 lb

## 2019-03-08 DIAGNOSIS — R5383 Other fatigue: Secondary | ICD-10-CM | POA: Insufficient documentation

## 2019-03-08 DIAGNOSIS — R5381 Other malaise: Secondary | ICD-10-CM | POA: Insufficient documentation

## 2019-03-08 DIAGNOSIS — I10 Essential (primary) hypertension: Secondary | ICD-10-CM | POA: Insufficient documentation

## 2019-03-08 DIAGNOSIS — E871 Hypo-osmolality and hyponatremia: Secondary | ICD-10-CM | POA: Diagnosis not present

## 2019-03-08 DIAGNOSIS — Z79899 Other long term (current) drug therapy: Secondary | ICD-10-CM | POA: Diagnosis not present

## 2019-03-08 DIAGNOSIS — C220 Liver cell carcinoma: Secondary | ICD-10-CM

## 2019-03-08 DIAGNOSIS — Z8505 Personal history of malignant neoplasm of liver: Secondary | ICD-10-CM | POA: Insufficient documentation

## 2019-03-08 DIAGNOSIS — E876 Hypokalemia: Secondary | ICD-10-CM

## 2019-03-08 DIAGNOSIS — Z8619 Personal history of other infectious and parasitic diseases: Secondary | ICD-10-CM | POA: Diagnosis not present

## 2019-03-08 DIAGNOSIS — R634 Abnormal weight loss: Secondary | ICD-10-CM | POA: Insufficient documentation

## 2019-03-08 DIAGNOSIS — Z8673 Personal history of transient ischemic attack (TIA), and cerebral infarction without residual deficits: Secondary | ICD-10-CM | POA: Insufficient documentation

## 2019-03-08 DIAGNOSIS — F17210 Nicotine dependence, cigarettes, uncomplicated: Secondary | ICD-10-CM | POA: Diagnosis not present

## 2019-03-08 DIAGNOSIS — Z7982 Long term (current) use of aspirin: Secondary | ICD-10-CM | POA: Diagnosis not present

## 2019-03-08 DIAGNOSIS — M1990 Unspecified osteoarthritis, unspecified site: Secondary | ICD-10-CM | POA: Diagnosis not present

## 2019-03-08 LAB — CBC WITH DIFFERENTIAL/PLATELET
Abs Immature Granulocytes: 0.01 10*3/uL (ref 0.00–0.07)
Basophils Absolute: 0.1 10*3/uL (ref 0.0–0.1)
Basophils Relative: 1 %
Eosinophils Absolute: 0.2 10*3/uL (ref 0.0–0.5)
Eosinophils Relative: 3 %
HCT: 45.9 % (ref 39.0–52.0)
Hemoglobin: 16.2 g/dL (ref 13.0–17.0)
Immature Granulocytes: 0 %
Lymphocytes Relative: 25 %
Lymphs Abs: 1.5 10*3/uL (ref 0.7–4.0)
MCH: 30.8 pg (ref 26.0–34.0)
MCHC: 35.3 g/dL (ref 30.0–36.0)
MCV: 87.3 fL (ref 80.0–100.0)
Monocytes Absolute: 0.4 10*3/uL (ref 0.1–1.0)
Monocytes Relative: 6 %
Neutro Abs: 3.8 10*3/uL (ref 1.7–7.7)
Neutrophils Relative %: 65 %
Platelets: 155 10*3/uL (ref 150–400)
RBC: 5.26 MIL/uL (ref 4.22–5.81)
RDW: 12.2 % (ref 11.5–15.5)
WBC: 5.8 10*3/uL (ref 4.0–10.5)
nRBC: 0 % (ref 0.0–0.2)

## 2019-03-08 LAB — COMPREHENSIVE METABOLIC PANEL
ALT: 55 U/L — ABNORMAL HIGH (ref 0–44)
AST: 65 U/L — ABNORMAL HIGH (ref 15–41)
Albumin: 4.1 g/dL (ref 3.5–5.0)
Alkaline Phosphatase: 160 U/L — ABNORMAL HIGH (ref 38–126)
Anion gap: 11 (ref 5–15)
BUN: 19 mg/dL (ref 8–23)
CO2: 22 mmol/L (ref 22–32)
Calcium: 9.4 mg/dL (ref 8.9–10.3)
Chloride: 98 mmol/L (ref 98–111)
Creatinine, Ser: 0.64 mg/dL (ref 0.61–1.24)
GFR calc Af Amer: >60 mL/min (ref >60)
GFR calc non Af Amer: >60 mL/min (ref >60)
Glucose, Bld: 117 mg/dL — ABNORMAL HIGH (ref 70–99)
Potassium: 3.4 mmol/L — ABNORMAL LOW (ref 3.5–5.1)
Sodium: 131 mmol/L — ABNORMAL LOW (ref 135–145)
Total Bilirubin: 0.8 mg/dL (ref 0.3–1.2)
Total Protein: 7.6 g/dL (ref 6.5–8.1)

## 2019-03-08 MED ORDER — POTASSIUM CHLORIDE CRYS ER 20 MEQ PO TBCR: EXTENDED_RELEASE_TABLET | ORAL | 0 refills | Status: DC

## 2019-03-08 MED ORDER — LENVIMA (8 MG DAILY DOSE) 2 X 4 MG PO CPPK: 8.0000 mg | ORAL_CAPSULE | Freq: Every day | ORAL | 0 refills | Status: DC

## 2019-03-09 LAB — AFP TUMOR MARKER: AFP, Serum, Tumor Marker: 1548 ng/mL — ABNORMAL HIGH (ref 0.0–8.3)

## 2019-03-15 ENCOUNTER — Other Ambulatory Visit: Payer: Self-pay

## 2019-03-15 ENCOUNTER — Ambulatory Visit
Admission: RE | Admit: 2019-03-15 | Discharge: 2019-03-15 | Disposition: A | Discharge status: Home / Self Care | Payer: Managed Care, Other (non HMO) | Source: Ambulatory Visit | Attending: Hematology and Oncology | Admitting: Hematology and Oncology

## 2019-03-15 DIAGNOSIS — C220 Liver cell carcinoma: Secondary | ICD-10-CM | POA: Diagnosis present

## 2019-03-15 MED ORDER — GADOBUTROL 1 MMOL/ML IV SOLN
5.0000 mL | Freq: Once | INTRAVENOUS | Status: AC | PRN
  Administered 2019-03-15: 5 mL via INTRAVENOUS

## 2019-03-20 ENCOUNTER — Other Ambulatory Visit: Payer: Self-pay | Admitting: Hematology and Oncology

## 2019-03-20 DIAGNOSIS — C220 Liver cell carcinoma: Secondary | ICD-10-CM

## 2019-03-22 ENCOUNTER — Other Ambulatory Visit: Payer: Self-pay

## 2019-03-22 DIAGNOSIS — C220 Liver cell carcinoma: Secondary | ICD-10-CM

## 2019-03-22 MED ORDER — LENVIMA (8 MG DAILY DOSE) 2 X 4 MG PO CPPK: 8.0000 mg | ORAL_CAPSULE | Freq: Every day | ORAL | 0 refills | Status: DC

## 2019-03-24 ENCOUNTER — Telehealth: Payer: Self-pay

## 2019-03-24 NOTE — Telephone Encounter (Signed)
refill Lenvatinib 8 mg take 2x4 mg cap( 2 cap daily # 60 with no refills. The patient was made aware.

## 2019-03-31 ENCOUNTER — Other Ambulatory Visit: Payer: Self-pay | Admitting: Hematology and Oncology

## 2019-03-31 DIAGNOSIS — E876 Hypokalemia: Secondary | ICD-10-CM

## 2019-03-31 NOTE — Telephone Encounter (Signed)
)    Ref Range & Units 3wk ago (03/08/19) 82mo ago (02/15/19) 65mo ago (02/08/19)  Potassium 3.5 - 5.1 mmol/L 3.4Low   3.1Low   2.6Low Panic

## 2019-04-07 ENCOUNTER — Inpatient Hospital Stay: Payer: Managed Care, Other (non HMO)

## 2019-04-07 ENCOUNTER — Other Ambulatory Visit: Payer: Self-pay

## 2019-04-07 ENCOUNTER — Telehealth: Payer: Self-pay

## 2019-04-07 ENCOUNTER — Inpatient Hospital Stay: Payer: Managed Care, Other (non HMO) | Attending: Oncology | Admitting: Oncology

## 2019-04-07 VITALS — BP 128/89 | HR 65 | Temp 97.4°F | Resp 16 | Wt 127.8 lb

## 2019-04-07 DIAGNOSIS — K7460 Unspecified cirrhosis of liver: Secondary | ICD-10-CM | POA: Diagnosis not present

## 2019-04-07 DIAGNOSIS — Z8673 Personal history of transient ischemic attack (TIA), and cerebral infarction without residual deficits: Secondary | ICD-10-CM | POA: Insufficient documentation

## 2019-04-07 DIAGNOSIS — Z7982 Long term (current) use of aspirin: Secondary | ICD-10-CM | POA: Diagnosis not present

## 2019-04-07 DIAGNOSIS — R634 Abnormal weight loss: Secondary | ICD-10-CM | POA: Diagnosis not present

## 2019-04-07 DIAGNOSIS — R5381 Other malaise: Secondary | ICD-10-CM | POA: Diagnosis not present

## 2019-04-07 DIAGNOSIS — I10 Essential (primary) hypertension: Secondary | ICD-10-CM | POA: Diagnosis not present

## 2019-04-07 DIAGNOSIS — C220 Liver cell carcinoma: Secondary | ICD-10-CM | POA: Diagnosis not present

## 2019-04-07 DIAGNOSIS — M1990 Unspecified osteoarthritis, unspecified site: Secondary | ICD-10-CM | POA: Insufficient documentation

## 2019-04-07 DIAGNOSIS — F17210 Nicotine dependence, cigarettes, uncomplicated: Secondary | ICD-10-CM | POA: Diagnosis not present

## 2019-04-07 DIAGNOSIS — Z79899 Other long term (current) drug therapy: Secondary | ICD-10-CM | POA: Insufficient documentation

## 2019-04-07 DIAGNOSIS — Z8619 Personal history of other infectious and parasitic diseases: Secondary | ICD-10-CM | POA: Diagnosis not present

## 2019-04-07 LAB — CBC WITH DIFFERENTIAL/PLATELET
Abs Immature Granulocytes: 0.01 10*3/uL (ref 0.00–0.07)
Basophils Absolute: 0 10*3/uL (ref 0.0–0.1)
Basophils Relative: 1 %
Eosinophils Absolute: 0.1 10*3/uL (ref 0.0–0.5)
Eosinophils Relative: 2 %
HCT: 49.2 % (ref 39.0–52.0)
Hemoglobin: 17.3 g/dL — ABNORMAL HIGH (ref 13.0–17.0)
Immature Granulocytes: 0 %
Lymphocytes Relative: 27 %
Lymphs Abs: 1.4 10*3/uL (ref 0.7–4.0)
MCH: 30.2 pg (ref 26.0–34.0)
MCHC: 35.2 g/dL (ref 30.0–36.0)
MCV: 85.9 fL (ref 80.0–100.0)
Monocytes Absolute: 0.3 10*3/uL (ref 0.1–1.0)
Monocytes Relative: 6 %
Neutro Abs: 3.4 10*3/uL (ref 1.7–7.7)
Neutrophils Relative %: 64 %
Platelets: 149 10*3/uL — ABNORMAL LOW (ref 150–400)
RBC: 5.73 MIL/uL (ref 4.22–5.81)
RDW: 12.9 % (ref 11.5–15.5)
WBC: 5.4 10*3/uL (ref 4.0–10.5)
nRBC: 0 % (ref 0.0–0.2)

## 2019-04-07 LAB — COMPREHENSIVE METABOLIC PANEL
ALT: 43 U/L (ref 0–44)
AST: 50 U/L — ABNORMAL HIGH (ref 15–41)
Albumin: 4.6 g/dL (ref 3.5–5.0)
Alkaline Phosphatase: 133 U/L — ABNORMAL HIGH (ref 38–126)
Anion gap: 11 (ref 5–15)
BUN: 13 mg/dL (ref 8–23)
CO2: 28 mmol/L (ref 22–32)
Calcium: 9.9 mg/dL (ref 8.9–10.3)
Chloride: 93 mmol/L — ABNORMAL LOW (ref 98–111)
Creatinine, Ser: 0.81 mg/dL (ref 0.61–1.24)
GFR calc Af Amer: >60 mL/min (ref >60)
GFR calc non Af Amer: >60 mL/min (ref >60)
Glucose, Bld: 105 mg/dL — ABNORMAL HIGH (ref 70–99)
Potassium: 4 mmol/L (ref 3.5–5.1)
Sodium: 132 mmol/L — ABNORMAL LOW (ref 135–145)
Total Bilirubin: 0.9 mg/dL (ref 0.3–1.2)
Total Protein: 8.5 g/dL — ABNORMAL HIGH (ref 6.5–8.1)

## 2019-04-07 MED ORDER — LENVIMA (8 MG DAILY DOSE) 2 X 4 MG PO CPPK: 8.0000 mg | ORAL_CAPSULE | Freq: Every day | ORAL | 0 refills | Status: DC

## 2019-04-07 NOTE — Progress Notes (Signed)
[facility]  [address] [place], [place] [address] Phone: [phone]  Fax: [phone]   Clinic Day:  04/07/2019  Referring physician: Donnie Coffin, MD  Chief Complaint: Chris Mcdonald is a 63 y.o. male with with a history of hepatitis C and hepatocellular carcinoma on lenvatinib who is seen for monthly assessment and imaging results.   HPI: The patient was last seen in the medical oncology clinic on 03/08/2019. At that time, he was feeling "all right".  He denied any abdominal pain.  He admitted to be chronically fatigued due to looking after his 44-year-old son.  Had abdominal MRI on 03/15/2019.  During the interim, he has been doing well.  He continues to tolerate his lenvatinib.  He says sometimes it "hurts his stomach" but he is doing okay.  He is maintaining adequate diet and drinking plenty of fluids.  He is requesting a refill of his lenvatinib.  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  No new liver lesions or abdominal metastatic disease identified.   Past Medical History:  Diagnosis Date  . Arthritis   . Hepatitis   . Hypertension   . Spinal stenosis   . Stroke [facility]     Past Surgical History:  Procedure Laterality Date  . COLONOSCOPY WITH PROPOFOL N/A 02/21/2015   Procedure: COLONOSCOPY WITH PROPOFOL;  Surgeon: Lollie Sails, MD;  Location: [facility] ENDOSCOPY;  Service: Endoscopy;  Laterality: N/A;  . INGUINAL HERNIA REPAIR Bilateral   . IR RADIOLOGIST EVAL & MGMT  08/04/2018  . POSTERIOR CERVICAL VERTEBRAE EXCISION      Family History  Problem Relation Age of Onset  . Cancer Brother   . Heart disease Brother     Social History:  reports that he has been smoking cigarettes. He has a 75.00 pack-year smoking history. He has never used smokeless tobacco. He reports that he does not drink alcohol or use drugs. He lives in [place] with his  wife and daughter.He has a 36 year old daughter, 29 year old son, and 58 year old son.The  The patient is alone today.  Allergies: No Known Allergies  Current Medications: Current Outpatient Medications  Medication Sig Dispense Refill  . amLODipine (NORVASC) 5 MG tablet Take 10 mg by mouth daily.     Marland Kitchen aspirin EC 81 MG tablet Take 81 mg by mouth daily.     . fluticasone (FLONASE) 50 MCG/ACT nasal spray 1 spray by Each Nare route daily.    . hydrochlorothiazide (HYDRODIURIL) 12.5 MG tablet Take 25 mg by mouth daily.     Marland Kitchen lenvatinib 8 mg daily dose (LENVIMA, 8 MG DAILY DOSE,) 2 x 4 MG capsule Take 2 capsules (8 mg total) by mouth daily. 60 capsule 0  . ondansetron (ZOFRAN) 4 MG tablet Take 1 tablet (4 mg total) by mouth every 8 (eight) hours as needed for nausea or vomiting. 20 tablet 0  . potassium chloride SA (KLOR-CON M20) 20 MEQ tablet TAKE 1 TABLET (20 MEQ TOTAL) BY MOUTH DAILY. AS DIRECTED BY MD 40 tablet 0   No current facility-administered medications for this visit.    Review of Systems  Constitutional: Positive for malaise/fatigue and weight loss (1 lb). Negative for chills and fever.  HENT: Negative for congestion, ear pain and tinnitus.   Eyes: Negative.  Negative for blurred vision and double vision.  Respiratory: Negative.  Negative for cough, sputum production and shortness  of breath.   Cardiovascular: Negative.  Negative for chest pain, palpitations and leg swelling.  Gastrointestinal: Negative.  Negative for abdominal pain, constipation, diarrhea, nausea and vomiting.  Genitourinary: Negative for dysuria, frequency and urgency.  Musculoskeletal: Negative for back pain and falls.  Skin: Negative.  Negative for rash.  Neurological: Positive for weakness. Negative for headaches.  Endo/Heme/Allergies: Negative.  Does not bruise/bleed easily.  Psychiatric/Behavioral: Negative.  Negative for depression. The patient is not nervous/anxious and does not have insomnia.     Performance status (ECOG):  1  Vitals Blood pressure 128/89, pulse 65, temperature (!) 97.4 F (36.3 C), temperature source Oral, resp. rate 16, weight 127 lb 12.1 oz (58 kg), SpO2 100 %.   Physical Exam  Constitutional: He is oriented to person, place, and time. He appears well-developed and well-nourished. No distress.  HENT:  Head: Normocephalic and atraumatic.  Mouth/Throat: Oropharynx is clear and moist. No oropharyngeal exudate.  Short, brown graying hair and full beard.  Edentulous.  Temporal wasting.  Mask.  Eyes: Pupils are equal, round, and reactive to light. Conjunctivae and EOM are normal. No scleral icterus.  Blue eyes.  Neck: No JVD present.  Cardiovascular: Normal rate, regular rhythm and normal heart sounds. Exam reveals no gallop.  No murmur heard. Pulmonary/Chest: Effort normal and breath sounds normal. No respiratory distress. He has no wheezes. He has no rales.  Abdominal: Soft. Bowel sounds are normal. He exhibits no distension and no mass. There is no splenomegaly or hepatomegaly. There is no abdominal tenderness. There is no rebound and no guarding.  Musculoskeletal:        General: No tenderness or edema. Normal range of motion.     Cervical back: Normal range of motion and neck supple.  Lymphadenopathy:       Head (right side): No preauricular, no posterior auricular and no occipital adenopathy present.       Head (left side): No preauricular, no posterior auricular and no occipital adenopathy present.    He has no cervical adenopathy.    He has no axillary adenopathy.       Right: No supraclavicular adenopathy present.       Left: No supraclavicular adenopathy present.  Neurological: He is alert and oriented to person, place, and time.  Skin: Skin is warm and dry. No rash noted. He is not diaphoretic. No pallor.  Psychiatric: His behavior is normal. Judgment and thought content normal.  Nursing note and vitals reviewed.   Appointment on 04/07/2019   Component Date Value Ref Range Status  . Sodium 04/07/2019 132* 135 - 145 mmol/L Final  . Potassium 04/07/2019 4.0  3.5 - 5.1 mmol/L Final  . Chloride 04/07/2019 93* 98 - 111 mmol/L Final  . CO2 04/07/2019 28  22 - 32 mmol/L Final  . Glucose, Bld 04/07/2019 105* 70 - 99 mg/dL Final  . BUN 04/07/2019 13  8 - 23 mg/dL Final  . Creatinine, Ser 04/07/2019 0.81  0.61 - 1.24 mg/dL Final  . Calcium 04/07/2019 9.9  8.9 - 10.3 mg/dL Final  . Total Protein 04/07/2019 8.5* 6.5 - 8.1 g/dL Final  . Albumin 04/07/2019 4.6  3.5 - 5.0 g/dL Final  . AST 04/07/2019 50* 15 - 41 U/L Final  . ALT 04/07/2019 43  0 - 44 U/L Final  . Alkaline Phosphatase 04/07/2019 133* 38 - 126 U/L Final  . Total Bilirubin 04/07/2019 0.9  0.3 - 1.2 mg/dL Final  . GFR calc non Af Amer 04/07/2019 >60  >60  mL/min Final  . GFR calc Af Amer 04/07/2019 >60  >60 mL/min Final  . Anion gap 04/07/2019 11  5 - 15 Final   Performed at [facility], [address], [place], [place] [address]  . WBC 04/07/2019 5.4  4.0 - 10.5 K/uL Final  . RBC 04/07/2019 5.73  4.22 - 5.81 MIL/uL Final  . Hemoglobin 04/07/2019 17.3* 13.0 - 17.0 g/dL Final  . HCT 04/07/2019 49.2  39.0 - 52.0 % Final  . MCV 04/07/2019 85.9  80.0 - 100.0 fL Final  . MCH 04/07/2019 30.2  26.0 - 34.0 pg Final  . MCHC 04/07/2019 35.2  30.0 - 36.0 g/dL Final  . RDW 04/07/2019 12.9  11.5 - 15.5 % Final  . Platelets 04/07/2019 149* 150 - 400 K/uL Final  . nRBC 04/07/2019 0.0  0.0 - 0.2 % Final  . Neutrophils Relative % 04/07/2019 64  % Final  . Neutro Abs 04/07/2019 3.4  1.7 - 7.7 K/uL Final  . Lymphocytes Relative 04/07/2019 27  % Final  . Lymphs Abs 04/07/2019 1.4  0.7 - 4.0 K/uL Final  . Monocytes Relative 04/07/2019 6  % Final  . Monocytes Absolute 04/07/2019 0.3  0.1 - 1.0 K/uL Final  . Eosinophils Relative 04/07/2019 2  % Final  . Eosinophils Absolute 04/07/2019 0.1  0.0 - 0.5 K/uL Final  . Basophils Relative 04/07/2019 1  % Final  . Basophils Absolute  04/07/2019 0.0  0.0 - 0.1 K/uL Final  . Immature Granulocytes 04/07/2019 0  % Final  . Abs Immature Granulocytes 04/07/2019 0.01  0.00 - 0.07 K/uL Final   Performed at [facility], [address], [place], [place] [address]    Assessment:  Javontay Basra is a 63 y.o. male with unresectable hepatocellular carcinoma. He has a history of hepatitis C(treated with Harvoni in 04/11/2015). He has hepatitis B. Mass was discovered incidentally on low dose chest CT screenig program. AFPwas 7329. Child-Pugh ClassisA (score 5).  Low dose chest CTon 06/18/2018 revealed Lung-RADS 4A-S, suspicious. There was a new centrilobular nodularity in both lungs, more likely inflammatory. Three was a 8.5 x 5.7 cm poorly marginated geographic region of low attenuation in the superior right liver, new since 06/17/2017. Chest CTon 09/16/2018 revealed aLung-RADSscore of2, benign appearance or behavior. There was an ill-defined liver mass felt to represent a hepatoma on 07/10/2018, in the setting of cirrhosis.  Abdomen MRI on 07/10/2018 revealed an 8.8 cm heterogeneous infiltrating segment 8. Findings were consistent with a hepatoma, given the underlying cirrhotic changes involving the liver. This was also invading and obstructing the middle and right portal veins and the middle hepatic vein. There were scattered abdominal lymph nodes but no definite findings for metastatic disease.  AFPhas been followed: [id number] on 07/27/2018,6250 on 08/17/2018, 467 on 11/18/2018, and 271 on 12/29/2018.  He began lenvatinibon 08/25/2018.  He has been off his lenvatinib since 12/27/2018.  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 There was no evidence of new liver lesions or abdominal metastasis.  He has a history of hypercalcemia. Calcium was 11.0 on 08/17/2018. PTH and PTH-rp  were low on 08/19/2018.Calcium was 9.3 on 11/30/2018.  Symptomatically, he is doing well.  He has chronic mild fatigue.  Liver function tests have improved.  Potassium is 4.0.  Plan: 1.  Labs today: CBC with diff, CMP, AFP. 2. Hepatocellular carcinoma Clinically, he is  doing well.  He denies any RUQ pain.             Liver function studies continue to improved.  AFP trending down. Previous 1548.                AFP pending today.  Abdominal MRI on 03/15/19  revealed stable disease.               Contact Nuala Alpha, oral chemotherapy pharmacist, for refill of lenvatinib.   Refill lenvatinib-done 3. Hypertension Blood pressure 128/89. He has baseline hypertension.               Lenvatinib has exacerbated baseline hypertension in the past. Hypertension under control withHCTZ 25 mg a day and amlodipine 10 mg a day. HCTZ results in hypokalemia. Continue to monitor. 4. Electrolyte issues Sodium 132. Potassium4.0. History of hypokalemia secondary to HCTZ.             He is able to swallow pills with applesauce.             Continue potassium supplementation as prescribed.  Continue to monitor. 5.Weight loss Weight down 1 pound.             No issues with nausea and vomiting.             Continue to monitor. 6.   RTC in 1 month for MD assessment, labs (CBC with diff, CMP, AFP).    I discussed the assessment and treatment plan with the patient.  The patient was provided an opportunity to ask questions and all were answered.  The patient agreed with the plan and demonstrated an understanding of the instructions.  The patient was advised to call back if the symptoms worsen or if the condition fails to improve as anticipated.  Greater than 50% was spent in counseling and coordination of care with this patient including but not limited to  discussion of the relevant topics above (See A&P) including, but not limited to diagnosis and management of acute and chronic medical conditions.   Faythe Casa, NP 04/07/2019 10:47 AM

## 2019-04-07 NOTE — Telephone Encounter (Signed)
Unable to leave a message. I have been trying to get in touch with the patient to give him the number to get his medication shipped to him. No answer and unable to leave a message i will try to reach out to the patient later today.

## 2019-04-07 NOTE — Progress Notes (Signed)
Patient here for follow up. Denies any concerns.  

## 2019-04-08 LAB — AFP TUMOR MARKER: AFP, Serum, Tumor Marker: 721 ng/mL — ABNORMAL HIGH (ref 0.0–8.3)

## 2019-04-23 ENCOUNTER — Other Ambulatory Visit: Payer: Self-pay | Admitting: Oncology

## 2019-04-23 DIAGNOSIS — C220 Liver cell carcinoma: Secondary | ICD-10-CM

## 2019-04-26 ENCOUNTER — Other Ambulatory Visit: Payer: Self-pay

## 2019-04-26 DIAGNOSIS — C220 Liver cell carcinoma: Secondary | ICD-10-CM

## 2019-04-26 MED ORDER — LENVIMA (8 MG DAILY DOSE) 2 X 4 MG PO CPPK: 8.0000 mg | ORAL_CAPSULE | Freq: Every day | ORAL | 0 refills | Status: DC

## 2019-05-03 NOTE — Progress Notes (Signed)
[facility]  [address], [address] [place], [place] [address] Phone: [phone]  Fax: [phone]   Clinic Day:  05/05/2019  Referring physician: Donnie Coffin, MD  Chief Complaint: Chris Mcdonald is a 64 y.o. male with stage IIIB hepatocellular carcinoma on lenvatinib who is seen for 1 month assessment.   HPI: The patient was last seen in the medical oncology clinic by Faythe Casa, NP on 04/07/2019. At that time, he was doing well.  He had chronic mild fatigue.  Liver function tests had improved.  AFP was 721.  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  There was no new liver lesions or abdominal metastatic disease.  During the interim, he states that he has been vomiting daily since the beginning of the year.  He has lost 11 lbs. He has been eating daily without issue. He drinks 2-3 bottles of Boost of day.   Yesterday he ate some vegetable soup, salisbury steak with mashed potatoes. He will only eat a little as he will vomit.  After he lets his stomach settle, he will go back and finish his food. If he has nothing on his stomach he won't throw up.  He isn't taking any nausea medications. He is okay speaking with nutritioonalist. He says he still has a few zofran left and will use them.   He describes abdominal pains that started around the 1st of the year. It doesn't matter if there is food on his stomach or not he will have an upset stomach.    His energy level is pretty good. He says that he doesn't need pain medication. If his stomach hurts too much he will lie down until the pain goes away. He feels the pain is in his lower abdomen.  He will have a bowel movement 1-2 times a week. Bowel movements do not make abdominal pain better or worse.   He continues to take potassium 20 meq a day.    Past Medical History:    Diagnosis Date  . Arthritis   . Hepatitis   . Hypertension   . Spinal stenosis   . Stroke [facility])     Past Surgical History:  Procedure Laterality Date  . COLONOSCOPY WITH PROPOFOL N/A 02/21/2015   Procedure: COLONOSCOPY WITH PROPOFOL;  Surgeon: Lollie Sails, MD;  Location: [facility] ENDOSCOPY;  Service: Endoscopy;  Laterality: N/A;  . INGUINAL HERNIA REPAIR Bilateral   . IR RADIOLOGIST EVAL & MGMT  08/04/2018  . POSTERIOR CERVICAL VERTEBRAE EXCISION      Family History  Problem Relation Age of Onset  . Cancer Brother   . Heart disease Brother     Social History:  reports that he has been smoking cigarettes. He has a 75.00 pack-year smoking history. He has never used smokeless tobacco. He reports that he does not drink alcohol or use drugs. He lives in [place] with his wife and daughter.He has a 36 year old daughter, 39 year old son, and 35 year old son. He lives in [place].  The patient is alone today.  Allergies: No Known Allergies  Current Medications: Current Outpatient Medications  Medication Sig Dispense Refill  . amLODipine (NORVASC) 5 MG tablet Take 10 mg by mouth daily.     Marland Kitchen aspirin EC 81 MG tablet Take 81 mg by mouth daily.     . fluticasone (FLONASE) 50 MCG/ACT  nasal spray 1 spray by Each Nare route daily.    . hydrochlorothiazide (HYDRODIURIL) 12.5 MG tablet Take 25 mg by mouth daily.     Marland Kitchen lenvatinib 8 mg daily dose (LENVIMA, 8 MG DAILY DOSE,) 2 x 4 MG capsule Take 2 capsules (8 mg total) by mouth daily. 60 capsule 0  . ondansetron (ZOFRAN) 4 MG tablet Take 1 tablet (4 mg total) by mouth every 8 (eight) hours as needed for nausea or vomiting. 20 tablet 0  . potassium chloride SA (KLOR-CON M20) 20 MEQ tablet TAKE 1 TABLET (20 MEQ TOTAL) BY MOUTH DAILY. AS DIRECTED BY MD 40 tablet 0   No current facility-administered medications for this visit.    Review of Systems  Constitutional: Positive for malaise/fatigue and weight loss (11 lbs). Negative for chills and fever.   HENT: Negative.  Negative for congestion, ear pain, nosebleeds, sinus pain, sore throat and tinnitus.   Eyes: Negative.  Negative for blurred vision and double vision.  Respiratory: Negative for cough, hemoptysis, sputum production and shortness of breath.   Cardiovascular: Negative.  Negative for chest pain, palpitations, orthopnea and leg swelling.  Gastrointestinal: Positive for abdominal pain, nausea and vomiting. Negative for constipation and diarrhea.  Genitourinary: Negative.  Negative for dysuria, frequency and urgency.  Musculoskeletal: Negative.  Negative for back pain, falls and myalgias.  Skin: Negative.  Negative for rash.  Neurological: Positive for weakness (generalized). Negative for dizziness, tingling, tremors, sensory change, speech change, focal weakness and headaches.  Endo/Heme/Allergies: Negative.  Does not bruise/bleed easily.  Psychiatric/Behavioral: Negative.  Negative for depression. The patient is not nervous/anxious and does not have insomnia.    Performance status (ECOG): 1  Vitals Blood pressure (!) 131/96, pulse 60, temperature (!) 95.4 F (35.2 C), resp. rate 18, height 5\' 7"  (1.702 m), weight 116 lb 8.2 oz (52.9 kg), SpO2 100 %.   Physical Exam  Constitutional: He is oriented to person, place, and time. He appears well-developed. No distress.  HENT:  Head: Normocephalic and atraumatic.  Mouth/Throat: Oropharynx is clear and moist. No oropharyngeal exudate.  Short dark graying hair.  Male pattern baldness.  Edentulous.  Temporal wasting.  Mask.  Eyes: Pupils are equal, round, and reactive to light. Conjunctivae and EOM are normal. No scleral icterus.  Blue eyes.  Neck: No JVD present.  Cardiovascular: Normal rate, regular rhythm and normal heart sounds. Exam reveals no gallop.  No murmur heard. Pulmonary/Chest: Effort normal and breath sounds normal. No respiratory distress. He has no wheezes. He has no rales.  Abdominal: Soft. Bowel sounds are normal.  He exhibits no distension and no mass. There is no splenomegaly or hepatomegaly. There is no abdominal tenderness. There is no rebound and no guarding.  Musculoskeletal:        General: No tenderness or edema. Normal range of motion.     Cervical back: Normal range of motion and neck supple.  Lymphadenopathy:       Head (right side): No preauricular, no posterior auricular and no occipital adenopathy present.       Head (left side): No preauricular, no posterior auricular and no occipital adenopathy present.    He has no cervical adenopathy.    He has no axillary adenopathy.       Right: No inguinal and no supraclavicular adenopathy present.       Left: No inguinal and no supraclavicular adenopathy present.  Neurological: He is alert and oriented to person, place, and time.  Skin: Skin is warm and  dry. No rash noted. He is not diaphoretic. No erythema. No pallor.  Psychiatric: He has a normal mood and affect. His behavior is normal. Judgment and thought content normal.  Nursing note and vitals reviewed.   Appointment on 05/05/2019  Component Date Value Ref Range Status  . Sodium 05/05/2019 137  135 - 145 mmol/L Final  . Potassium 05/05/2019 3.1* 3.5 - 5.1 mmol/L Final  . Chloride 05/05/2019 101  98 - 111 mmol/L Final  . CO2 05/05/2019 25  22 - 32 mmol/L Final  . Glucose, Bld 05/05/2019 104* 70 - 99 mg/dL Final  . BUN 05/05/2019 18  8 - 23 mg/dL Final  . Creatinine, Ser 05/05/2019 0.91  0.61 - 1.24 mg/dL Final  . Calcium 05/05/2019 9.4  8.9 - 10.3 mg/dL Final  . Total Protein 05/05/2019 8.2* 6.5 - 8.1 g/dL Final  . Albumin 05/05/2019 4.5  3.5 - 5.0 g/dL Final  . AST 05/05/2019 46* 15 - 41 U/L Final  . ALT 05/05/2019 40  0 - 44 U/L Final  . Alkaline Phosphatase 05/05/2019 109  38 - 126 U/L Final  . Total Bilirubin 05/05/2019 1.2  0.3 - 1.2 mg/dL Final  . GFR calc non Af Amer 05/05/2019 >60  >60 mL/min Final  . GFR calc Af Amer 05/05/2019 >60  >60 mL/min Final  . Anion gap 05/05/2019  11  5 - 15 Final   Performed at [facility], [address], [place], [place] [address]  . WBC 05/05/2019 5.0  4.0 - 10.5 K/uL Final  . RBC 05/05/2019 5.34  4.22 - 5.81 MIL/uL Final  . Hemoglobin 05/05/2019 16.3  13.0 - 17.0 g/dL Final  . HCT 05/05/2019 47.4  39.0 - 52.0 % Final  . MCV 05/05/2019 88.8  80.0 - 100.0 fL Final  . MCH 05/05/2019 30.5  26.0 - 34.0 pg Final  . MCHC 05/05/2019 34.4  30.0 - 36.0 g/dL Final  . RDW 05/05/2019 14.7  11.5 - 15.5 % Final  . Platelets 05/05/2019 128* 150 - 400 K/uL Final  . nRBC 05/05/2019 0.0  0.0 - 0.2 % Final  . Neutrophils Relative % 05/05/2019 54  % Final  . Neutro Abs 05/05/2019 2.7  1.7 - 7.7 K/uL Final  . Lymphocytes Relative 05/05/2019 37  % Final  . Lymphs Abs 05/05/2019 1.9  0.7 - 4.0 K/uL Final  . Monocytes Relative 05/05/2019 5  % Final  . Monocytes Absolute 05/05/2019 0.3  0.1 - 1.0 K/uL Final  . Eosinophils Relative 05/05/2019 3  % Final  . Eosinophils Absolute 05/05/2019 0.2  0.0 - 0.5 K/uL Final  . Basophils Relative 05/05/2019 1  % Final  . Basophils Absolute 05/05/2019 0.0  0.0 - 0.1 K/uL Final  . Immature Granulocytes 05/05/2019 0  % Final  . Abs Immature Granulocytes 05/05/2019 0.01  0.00 - 0.07 K/uL Final   Performed at [facility], [address], [place], [place] [address]    Assessment:  Chris Mcdonald is a 64 y.o. male with unresectable hepatocellular carcinoma. He has a history of hepatitis C(treated with Harvoni in 04/11/2015). He has hepatitis B. Mass was discovered incidentally on low dose chest CT screenig program. AFPwas 7329. Child-Pugh ClassisA (score 5).  Low dose chest CTon 06/18/2018 revealed Lung-RADS 4A-S, suspicious. There was a new centrilobular nodularity in both lungs, more likely inflammatory. Three was a 8.5 x 5.7 cm poorly marginated geographic region of low attenuation in the superior right liver, new since 06/17/2017. Chest  CTon 09/16/2018 revealed  aLung-RADSscore of2, benign appearance or behavior. There was an ill-defined liver mass felt to represent a hepatoma on 07/10/2018, in the setting of cirrhosis.  Abdomen MRI on 07/10/2018 revealed an 8.8 cm heterogeneous infiltrating segment 8. Findings were consistent with a hepatoma, given the underlying cirrhotic changes involving the liver. This was also invading and obstructing the middle and right portal veins and the middle hepatic vein. There were scattered abdominal lymph nodes but no definite findings for metastatic disease.  AFPhas been followed: [id number] on 07/27/2018,6250 on 08/17/2018, 467 on 11/18/2018, 271 on 12/29/2018, 2320 on 02/08/2019, 1548 on 03/08/2019, 721 on 04/07/2019, and 500 on 05/05/2019.  He began lenvatinibon 08/25/2018.He was off lenvatinib from 12/27/2018 - 02/14/2019.  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 There was no evidence of new liver lesions or abdominal metastasis.  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  There was no new liver lesions or abdominal metastatic disease.  He has a history of hypercalcemia. Calcium was 11.0 on 08/17/2018. PTH and PTH-rp were low on 08/19/2018.Calcium was 9.3 on 11/30/2018.  Symptomatically, he is having issues with nausea and vomiting.  He has lost 11 pounds.  Plan: 1.   Labs today: CBC with diff, CMP, AFP. 2. Hepatocellular carcinoma Clinically, he is doing fair. Liver function studies have improved since reinitiation of lenvatinib.  AFP continues to improve after initial spike with discontinuation of lenvatinib.             AFP is 500 today.             Abdominal MRI on 11/23/20202  revealed stable disease.   Discuss symptom management of nausea.   3. Hypertension Blood pressure131/96.     Recheck.   Schedule appointment with Dr Clide Deutscher next available. He has baseline hypertension.  Lenvatinib has exacerbated baseline hypertension. Patient on HCTZ 25 mg a day and amlodipine 10 mg a day. Continue to monitor. 4.   Nausea  Etiology likely secondary to lenvatinib (incidence 20-50%).  Discuss taking ondansetron prior to meals.  Rx:  ondansetron 4 mg po q 8 hours prn nausea (dis: #20).  Discuss maintaining good hydration. 5. Hypokalemia Sodium137. Potassium3.1. Patient has hypokalemia secondary to HCTZ.  He is able to swallow pills with applesauce. Increase potassium to 20 meq BID x 3 days then back to 20 meq a day. 6.Weight loss Weight down 11 pounds secondary to nausea and vomiting. Discuss eating small frequent meals.  Discuss maintaining good hydration.  Discuss supplemental Boost/Ensure (samples provided).  Nutrition consult. 7.   RTC in 7 days for MD assessment and labs (BMP).  I discussed the assessment and treatment plan with the patient.  The patient was provided an opportunity to ask questions and all were answered.  The patient agreed with the plan and demonstrated an understanding of the instructions.  The patient was advised to call back if the symptoms worsen or if the condition fails to improve as anticipated.  I provided 16 minutes (9:35 AM - 9:50 AM) of face-to-face time during this encounter and > 50% was spent counseling as documented under my assessment and plan.    Lequita Asal, MD, PhD    05/05/2019, 9:50 AM  I, Samul Dada, am acting as a Education administrator for [employer],  MD.  I, Kattie Santoyo C. Mike Gip, MD, have reviewed the above documentation for accuracy and completeness, and I agree with the  above.

## 2019-05-04 ENCOUNTER — Other Ambulatory Visit: Payer: Self-pay

## 2019-05-04 NOTE — Progress Notes (Signed)
Confirmed Name and DOB. Reports vomiting x 1-2 episodes daily after eating since 04/23/2019. Also reports diarrhea that lasted approx. 1 week but is resolved at this time. Denies any fever or other symptoms.

## 2019-05-05 ENCOUNTER — Inpatient Hospital Stay: Payer: Managed Care, Other (non HMO) | Attending: Hematology and Oncology | Admitting: Hematology and Oncology

## 2019-05-05 ENCOUNTER — Encounter: Payer: Self-pay | Admitting: Hematology and Oncology

## 2019-05-05 ENCOUNTER — Inpatient Hospital Stay: Payer: Managed Care, Other (non HMO)

## 2019-05-05 VITALS — BP 131/96 | HR 60 | Temp 95.4°F | Resp 18 | Ht 67.0 in | Wt 116.5 lb

## 2019-05-05 DIAGNOSIS — R112 Nausea with vomiting, unspecified: Secondary | ICD-10-CM | POA: Diagnosis not present

## 2019-05-05 DIAGNOSIS — E876 Hypokalemia: Secondary | ICD-10-CM | POA: Diagnosis not present

## 2019-05-05 DIAGNOSIS — Z7189 Other specified counseling: Secondary | ICD-10-CM

## 2019-05-05 DIAGNOSIS — C220 Liver cell carcinoma: Secondary | ICD-10-CM

## 2019-05-05 DIAGNOSIS — R634 Abnormal weight loss: Secondary | ICD-10-CM | POA: Diagnosis not present

## 2019-05-05 DIAGNOSIS — R109 Unspecified abdominal pain: Secondary | ICD-10-CM | POA: Insufficient documentation

## 2019-05-05 DIAGNOSIS — M1990 Unspecified osteoarthritis, unspecified site: Secondary | ICD-10-CM | POA: Insufficient documentation

## 2019-05-05 DIAGNOSIS — Z79899 Other long term (current) drug therapy: Secondary | ICD-10-CM | POA: Insufficient documentation

## 2019-05-05 DIAGNOSIS — I10 Essential (primary) hypertension: Secondary | ICD-10-CM | POA: Insufficient documentation

## 2019-05-05 DIAGNOSIS — R5382 Chronic fatigue, unspecified: Secondary | ICD-10-CM | POA: Insufficient documentation

## 2019-05-05 DIAGNOSIS — F17210 Nicotine dependence, cigarettes, uncomplicated: Secondary | ICD-10-CM | POA: Insufficient documentation

## 2019-05-05 DIAGNOSIS — Z7982 Long term (current) use of aspirin: Secondary | ICD-10-CM | POA: Insufficient documentation

## 2019-05-05 DIAGNOSIS — Z8619 Personal history of other infectious and parasitic diseases: Secondary | ICD-10-CM | POA: Insufficient documentation

## 2019-05-05 DIAGNOSIS — Z8673 Personal history of transient ischemic attack (TIA), and cerebral infarction without residual deficits: Secondary | ICD-10-CM | POA: Insufficient documentation

## 2019-05-05 DIAGNOSIS — I158 Other secondary hypertension: Secondary | ICD-10-CM

## 2019-05-05 LAB — COMPREHENSIVE METABOLIC PANEL
ALT: 40 U/L (ref 0–44)
AST: 46 U/L — ABNORMAL HIGH (ref 15–41)
Albumin: 4.5 g/dL (ref 3.5–5.0)
Alkaline Phosphatase: 109 U/L (ref 38–126)
Anion gap: 11 (ref 5–15)
BUN: 18 mg/dL (ref 8–23)
CO2: 25 mmol/L (ref 22–32)
Calcium: 9.4 mg/dL (ref 8.9–10.3)
Chloride: 101 mmol/L (ref 98–111)
Creatinine, Ser: 0.91 mg/dL (ref 0.61–1.24)
GFR calc Af Amer: >60 mL/min (ref >60)
GFR calc non Af Amer: >60 mL/min (ref >60)
Glucose, Bld: 104 mg/dL — ABNORMAL HIGH (ref 70–99)
Potassium: 3.1 mmol/L — ABNORMAL LOW (ref 3.5–5.1)
Sodium: 137 mmol/L (ref 135–145)
Total Bilirubin: 1.2 mg/dL (ref 0.3–1.2)
Total Protein: 8.2 g/dL — ABNORMAL HIGH (ref 6.5–8.1)

## 2019-05-05 LAB — CBC WITH DIFFERENTIAL/PLATELET
Abs Immature Granulocytes: 0.01 10*3/uL (ref 0.00–0.07)
Basophils Absolute: 0 10*3/uL (ref 0.0–0.1)
Basophils Relative: 1 %
Eosinophils Absolute: 0.2 10*3/uL (ref 0.0–0.5)
Eosinophils Relative: 3 %
HCT: 47.4 % (ref 39.0–52.0)
Hemoglobin: 16.3 g/dL (ref 13.0–17.0)
Immature Granulocytes: 0 %
Lymphocytes Relative: 37 %
Lymphs Abs: 1.9 10*3/uL (ref 0.7–4.0)
MCH: 30.5 pg (ref 26.0–34.0)
MCHC: 34.4 g/dL (ref 30.0–36.0)
MCV: 88.8 fL (ref 80.0–100.0)
Monocytes Absolute: 0.3 10*3/uL (ref 0.1–1.0)
Monocytes Relative: 5 %
Neutro Abs: 2.7 10*3/uL (ref 1.7–7.7)
Neutrophils Relative %: 54 %
Platelets: 128 10*3/uL — ABNORMAL LOW (ref 150–400)
RBC: 5.34 MIL/uL (ref 4.22–5.81)
RDW: 14.7 % (ref 11.5–15.5)
WBC: 5 10*3/uL (ref 4.0–10.5)
nRBC: 0 % (ref 0.0–0.2)

## 2019-05-05 MED ORDER — ONDANSETRON HCL 4 MG PO TABS: 4.0000 mg | ORAL_TABLET | Freq: Three times a day (TID) | ORAL | 0 refills | Status: DC | PRN

## 2019-05-06 ENCOUNTER — Telehealth: Payer: Self-pay

## 2019-05-06 LAB — AFP TUMOR MARKER: AFP, Serum, Tumor Marker: 500 ng/mL — ABNORMAL HIGH (ref 0.0–8.3)

## 2019-05-06 NOTE — Telephone Encounter (Signed)
Informed patient of tumor marker results. Patient verbalizes understanding and denies any further questions or concerns.

## 2019-05-06 NOTE — Telephone Encounter (Signed)
-----   Message from Lequita Asal, MD sent at 05/06/2019  6:55 AM EST ----- Regarding: Please call patient with improvement in AFP  ----- Message ----- From: Interface, [facility] [place]: 05/05/2019   8:48 AM EST To: Lequita Asal, MD

## 2019-05-11 ENCOUNTER — Other Ambulatory Visit: Payer: Self-pay

## 2019-05-11 NOTE — Progress Notes (Signed)
[facility]  [address], [address] [place], [place] [address] Phone: [phone]  Fax: [phone]   Clinic Day:  05/12/2019  Referring physician: Donnie Coffin, MD  Chief Complaint: Chris Mcdonald is a 64 y.o. male with stage IIIB hepatocellular carcinoma on lenvatinib who is seen for 1 week reassessment.   HPI: The patient was last seen in the medical oncology clinic on 04/22/2018.  At that time, he described vomiting daily with abdominal pain since the beginning of the year.  He had lost 11 pounds.  Blood pressure was elevated (131/96).  Potassium was 3.1.  AFP had decreased from 721 to 500.  We discussed management of his nausea.  He was referred to nutrition and given samples of Boost. He was scheduled for follow-up with Dr Clide Deutscher.  Oral potassium was increased for 3 days.  During the interim, he has gotten better. He believes he had a cold and is now getting over it.  He notes improvement in his nausea.  He is taking ondansetron.  Nausea is described as "rare" with only had 2 episodes of vomiting.  He feels that he is back to baseline.  He denies any diarrhea.   He needs a refill on his potassium.  He has an appointment with Dr Clide Deutscher about his hypertension on 05/18/2019.  He is supposed to get in touch with nutritionalist around 02/012021.   Past Medical History:  Diagnosis Date  . Arthritis   . Hepatitis   . Hypertension   . Spinal stenosis   . Stroke [facility])     Past Surgical History:  Procedure Laterality Date  . COLONOSCOPY WITH PROPOFOL N/A 02/21/2015   Procedure: COLONOSCOPY WITH PROPOFOL;  Surgeon: Lollie Sails, MD;  Location: [facility] ENDOSCOPY;  Service: Endoscopy;  Laterality: N/A;  . INGUINAL HERNIA REPAIR Bilateral   . IR RADIOLOGIST EVAL & MGMT  08/04/2018  . POSTERIOR CERVICAL VERTEBRAE EXCISION      Family History  Problem Relation Age of Onset  . Cancer Brother   . Heart disease Brother     Social History:  reports that he  has been smoking cigarettes. He has a 75.00 pack-year smoking history. He has never used smokeless tobacco. He reports that he does not drink alcohol or use drugs. He lives in [place] with his wife and daughter.He has a 23 year old daughter, 73 year old son, and 42 year old son. The patient is alone today.  Allergies: No Known Allergies  Current Medications: Current Outpatient Medications  Medication Sig Dispense Refill  . amLODipine (NORVASC) 5 MG tablet Take 10 mg by mouth daily.     Marland Kitchen aspirin EC 81 MG tablet Take 81 mg by mouth daily.     . fluticasone (FLONASE) 50 MCG/ACT nasal spray 1 spray by Each Nare route daily.    . hydrochlorothiazide (HYDRODIURIL) 12.5 MG tablet Take 25 mg by mouth daily.     Marland Kitchen lenvatinib 8 mg daily dose (LENVIMA, 8 MG DAILY DOSE,) 2 x 4 MG capsule Take 2 capsules (8 mg total) by mouth daily. 60 capsule 0  . ondansetron (ZOFRAN) 4 MG tablet Take 1 tablet (4 mg total) by mouth every 8 (eight) hours as needed for nausea or vomiting. 20 tablet 0  . potassium chloride SA (KLOR-CON M20) 20 MEQ tablet TAKE 1 TABLET (20 MEQ TOTAL) BY MOUTH DAILY. AS DIRECTED BY MD 40 tablet 0   No current facility-administered medications for this visit.    Review of Systems  Constitutional: Positive for malaise/fatigue. Negative for chills, fever and weight loss (up 3  lbs).       Feels "good".  HENT: Negative.  Negative for congestion, ear pain, nosebleeds, sinus pain, sore throat and tinnitus.   Eyes: Negative.  Negative for blurred vision and double vision.  Respiratory: Negative for cough, hemoptysis, sputum production and shortness of breath.   Cardiovascular: Negative.  Negative for chest pain, palpitations and leg swelling.  Gastrointestinal: Positive for nausea (improved) and vomiting. Negative for abdominal pain, constipation and diarrhea.  Genitourinary: Negative.  Negative for dysuria, frequency and urgency.  Musculoskeletal: Negative.  Negative for back pain, falls,  myalgias and neck pain.  Skin: Negative.  Negative for rash.  Neurological: Positive for weakness. Negative for tingling, tremors, sensory change, focal weakness and headaches.  Endo/Heme/Allergies: Negative.  Does not bruise/bleed easily.  Psychiatric/Behavioral: Negative.  Negative for depression. The patient is not nervous/anxious and does not have insomnia.    Performance status (ECOG): 1   Vitals Blood pressure (!) 130/97, pulse 72, temperature (!) 95.6 F (35.3 C), temperature source Tympanic, resp. rate 18, weight 119 lb 0.8 oz (54 kg), SpO2 100 %.   Physical Exam  Constitutional: He is oriented to person, place, and time. He appears well-developed and well-nourished. No distress.  HENT:  Head: Normocephalic and atraumatic.  Mouth/Throat: Oropharynx is clear and moist. No oropharyngeal exudate.  Short brown hair.  Edentulous.  Temporal wasting.  Mask.  Eyes: Pupils are equal, round, and reactive to light. Conjunctivae and EOM are normal. No scleral icterus.  Blue eyes.  Neck: No JVD present.  Cardiovascular: Normal rate, regular rhythm and normal heart sounds. Exam reveals no gallop.  No murmur heard. Pulmonary/Chest: Effort normal and breath sounds normal. No respiratory distress. He has no wheezes. He has no rales.  Abdominal: Soft. Bowel sounds are normal. He exhibits no distension and no mass. There is no splenomegaly or hepatomegaly. There is no abdominal tenderness. There is no rebound and no guarding.  Musculoskeletal:        General: No tenderness or edema. Normal range of motion.     Cervical back: Normal range of motion and neck supple.  Lymphadenopathy:       Head (right side): No preauricular, no posterior auricular and no occipital adenopathy present.       Head (left side): No preauricular, no posterior auricular and no occipital adenopathy present.    He has no cervical adenopathy.    He has no axillary adenopathy.       Right: No inguinal and no supraclavicular  adenopathy present.       Left: No inguinal and no supraclavicular adenopathy present.  Neurological: He is alert and oriented to person, place, and time.  Skin: Skin is warm and dry. No rash noted. He is not diaphoretic. No erythema. No pallor.  Psychiatric: He has a normal mood and affect. His behavior is normal. Judgment and thought content normal.  Nursing note and vitals reviewed.   Appointment on 05/12/2019  Component Date Value Ref Range Status  . Sodium 05/12/2019 135  135 - 145 mmol/L Final  . Potassium 05/12/2019 3.6  3.5 - 5.1 mmol/L Final  . Chloride 05/12/2019 99  98 - 111 mmol/L Final  . CO2 05/12/2019 26  22 - 32 mmol/L Final  . Glucose, Bld 05/12/2019 103* 70 - 99 mg/dL Final  . BUN 05/12/2019 21  8 - 23 mg/dL Final  . Creatinine, Ser 05/12/2019 0.91  0.61 -  1.24 mg/dL Final  . Calcium 05/12/2019 9.4  8.9 - 10.3 mg/dL Final  . GFR calc non Af Amer 05/12/2019 >60  >60 mL/min Final  . GFR calc Af Amer 05/12/2019 >60  >60 mL/min Final  . Anion gap 05/12/2019 10  5 - 15 Final   Performed at [facility], [address] [place], [place] [address]    Assessment:  Daelen Seang is a 65 y.o. male with unresectable hepatocellular carcinoma. He has a history of hepatitis C(treated with Harvoni in 04/11/2015). He has hepatitis B. Mass was discovered incidentally on low dose chest CT screenig program. AFPwas 7329. Child-Pugh ClassisA (score 5).  Low dose chest CTon 06/18/2018 revealed Lung-RADS 4A-S, suspicious. There was a new centrilobular nodularity in both lungs, more likely inflammatory. Three was a 8.5 x 5.7 cm poorly marginated geographic region of low attenuation in the superior right liver, new since 06/17/2017. Chest CTon 09/16/2018 revealed aLung-RADSscore of2, benign appearance or behavior. There was an ill-defined liver mass felt to represent a hepatoma on 07/10/2018, in the setting of cirrhosis.  Abdomen MRI on 07/10/2018 revealed an  8.8 cm heterogeneous infiltrating segment 8. Findings were consistent with a hepatoma, given the underlying cirrhotic changes involving the liver. This was also invading and obstructing the middle and right portal veins and the middle hepatic vein. There were scattered abdominal lymph nodes but no definite findings for metastatic disease.  AFPhas been followed: [id number] on 07/27/2018,6250 on 08/17/2018, 467 on 11/18/2018, 271 on 12/29/2018, 2320 on 02/08/2019, 1548 on 03/08/2019, 721 on 04/07/2019, and 500 on 05/05/2019.  He began lenvatinibon 08/25/2018.He has been off his lenvatinib from 12/27/2018 - 02/14/2019.  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 There was no evidence of new liver lesions or abdominal metastasis.  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  There was no new liver lesions or abdominal metastatic disease.  He has a history of hypercalcemia. Calcium was 11.0 on 08/17/2018. PTH and PTH-rp were low on 08/19/2018.Calcium was 9.3 on 11/30/2018.  Symptomatically, he is feeling better with minimal nausea (back to baseline).  Potassium is 3.6.  Plan: 1.   Labs today: BMP. 2. Hepatocellular carcinoma Clinically, he is back to baseline.                          He denies any abdominal pain.   Nausea secondary to lenvatinib has improved with ondansetron. AFP has improved from 1548 to 721 to 500 over past 2 months.              Abdominal MRI on 03/15/2019  revealed stable disease.  Anticipate next imaging during the 3rd week of February. 3. Hypertension Blood pressure130/97. He has baseline hypertension.  Lenvatinib is exacerbated by  lenvatinib. He is currently on HCTZ 25 mg a day and amlodipine 10 mg a day. HCTZ results in hypokalemia. Patient has follow-up with Dr Clide Deutscher on 05/08/2019. 4. Hypokelamia Potassium3.1 to 3.6 in the past week. Oral potassium was transiently increased to 2 pills/day x 3 days. Continue potassium 20 meq/day.             Continue to monitor. 5.Weight loss Weight up 3 pounds with control of nausea. Patient to meet with nutrition on 05/24/2019. 6.  Schedule abdominal MRI on 06/15/2019. 7.   RN to call Dr Phill Mutter office re: patient's HTN. 8.   RTC after MRI for MD assessment, labs (CBC with diff, CMP, Mg, AFP- can be drawn before MRI), and review of MRI.  I discussed the assessment and treatment plan with the patient.  The patient was provided an opportunity to ask questions and all were answered.  The patient agreed with the plan and demonstrated an understanding of the instructions.  The patient was advised to call back if the symptoms worsen or if the condition fails to improve as anticipated.   Lequita Asal, MD, PhD    05/12/2019, 9:24 AM  I, Samul Dada, am acting as a scribe for Lequita Asal, MD.  I, Sarles Mike Gip, MD, have reviewed the above documentation for accuracy and completeness, and I agree with the above.

## 2019-05-11 NOTE — Progress Notes (Signed)
Confirmed name and DOB. Denies any concerns. States he feels much better.

## 2019-05-12 ENCOUNTER — Encounter: Payer: Self-pay | Admitting: Hematology and Oncology

## 2019-05-12 ENCOUNTER — Inpatient Hospital Stay: Payer: Managed Care, Other (non HMO)

## 2019-05-12 ENCOUNTER — Inpatient Hospital Stay (HOSPITAL_BASED_OUTPATIENT_CLINIC_OR_DEPARTMENT_OTHER): Payer: Managed Care, Other (non HMO) | Admitting: Hematology and Oncology

## 2019-05-12 VITALS — BP 130/97 | HR 72 | Temp 95.6°F | Resp 18 | Wt 119.0 lb

## 2019-05-12 DIAGNOSIS — R634 Abnormal weight loss: Secondary | ICD-10-CM | POA: Diagnosis not present

## 2019-05-12 DIAGNOSIS — C220 Liver cell carcinoma: Secondary | ICD-10-CM

## 2019-05-12 DIAGNOSIS — I158 Other secondary hypertension: Secondary | ICD-10-CM

## 2019-05-12 DIAGNOSIS — R918 Other nonspecific abnormal finding of lung field: Secondary | ICD-10-CM | POA: Diagnosis not present

## 2019-05-12 DIAGNOSIS — E876 Hypokalemia: Secondary | ICD-10-CM | POA: Diagnosis not present

## 2019-05-12 LAB — BASIC METABOLIC PANEL
Anion gap: 10 (ref 5–15)
BUN: 21 mg/dL (ref 8–23)
CO2: 26 mmol/L (ref 22–32)
Calcium: 9.4 mg/dL (ref 8.9–10.3)
Chloride: 99 mmol/L (ref 98–111)
Creatinine, Ser: 0.91 mg/dL (ref 0.61–1.24)
GFR calc Af Amer: >60 mL/min (ref >60)
GFR calc non Af Amer: >60 mL/min (ref >60)
Glucose, Bld: 103 mg/dL — ABNORMAL HIGH (ref 70–99)
Potassium: 3.6 mmol/L (ref 3.5–5.1)
Sodium: 135 mmol/L (ref 135–145)

## 2019-05-12 MED ORDER — POTASSIUM CHLORIDE CRYS ER 20 MEQ PO TBCR: EXTENDED_RELEASE_TABLET | ORAL | 0 refills | Status: DC

## 2019-05-22 DIAGNOSIS — I158 Other secondary hypertension: Secondary | ICD-10-CM | POA: Insufficient documentation

## 2019-05-24 ENCOUNTER — Inpatient Hospital Stay: Payer: Managed Care, Other (non HMO) | Attending: Hematology and Oncology

## 2019-05-24 DIAGNOSIS — Z79899 Other long term (current) drug therapy: Secondary | ICD-10-CM | POA: Insufficient documentation

## 2019-05-24 DIAGNOSIS — C220 Liver cell carcinoma: Secondary | ICD-10-CM | POA: Insufficient documentation

## 2019-05-24 DIAGNOSIS — R590 Localized enlarged lymph nodes: Secondary | ICD-10-CM | POA: Insufficient documentation

## 2019-05-24 DIAGNOSIS — Z7982 Long term (current) use of aspirin: Secondary | ICD-10-CM | POA: Insufficient documentation

## 2019-05-24 DIAGNOSIS — K30 Functional dyspepsia: Secondary | ICD-10-CM | POA: Insufficient documentation

## 2019-05-24 DIAGNOSIS — Z8673 Personal history of transient ischemic attack (TIA), and cerebral infarction without residual deficits: Secondary | ICD-10-CM | POA: Insufficient documentation

## 2019-05-24 DIAGNOSIS — I10 Essential (primary) hypertension: Secondary | ICD-10-CM | POA: Insufficient documentation

## 2019-05-24 DIAGNOSIS — M1990 Unspecified osteoarthritis, unspecified site: Secondary | ICD-10-CM | POA: Insufficient documentation

## 2019-05-24 DIAGNOSIS — E860 Dehydration: Secondary | ICD-10-CM | POA: Insufficient documentation

## 2019-05-24 DIAGNOSIS — R1110 Vomiting, unspecified: Secondary | ICD-10-CM | POA: Insufficient documentation

## 2019-05-24 DIAGNOSIS — R1011 Right upper quadrant pain: Secondary | ICD-10-CM | POA: Insufficient documentation

## 2019-05-24 DIAGNOSIS — E876 Hypokalemia: Secondary | ICD-10-CM | POA: Insufficient documentation

## 2019-05-24 DIAGNOSIS — F17210 Nicotine dependence, cigarettes, uncomplicated: Secondary | ICD-10-CM | POA: Insufficient documentation

## 2019-05-24 DIAGNOSIS — R634 Abnormal weight loss: Secondary | ICD-10-CM | POA: Insufficient documentation

## 2019-05-24 DIAGNOSIS — Z8619 Personal history of other infectious and parasitic diseases: Secondary | ICD-10-CM | POA: Insufficient documentation

## 2019-05-24 NOTE — Progress Notes (Signed)
Nutrition Assessment:  Referral from Dr. Mike Gip, weight loss.  64 year old male with hepatocellular carcinoma on lenvatinib.  Past medical history of hepatitis, spinal stenosis, HTN, stroke  Spoke with patient via phone for nutrition assessment.  Patient reports that he usually eats grits, eggs, bacon for breakfast. Lunch is soup and supper is meat and sides or chili beans.  Drinks boost 1-2 times per day.  Reports constant abdominal pain, not exacerbated by any foods.  Reports that he has a bowel movement about 1-2 times per week. Reports some nausea.  Usually takes zofran about 1 time per day sometimes 2 times.  Reports that milk, ice cream gives him diarrhea.  Can eat a little bit of cheese without difficulty.   Medications: zofran, KCL  Labs: reviewed  Anthropometrics:   Height: 67 inches Weight: 119 lb UBW: 145 lb in 2015 per patient BMI: 18  04/07/2019 127 lb  6% weight loss in the last month, significant  Estimated Energy Needs  Kcals: 1600-1900 Protein: 80-95 g Fluid: > 1.6 L  NUTRITION DIAGNOSIS: Inadequate oral intake related to cancer and related treatment side effects as evidenced by nausea, vomiting, abdominal pain and 6% weight loss in the last month   INTERVENTION:  Discussed importance of good nutrition. Reviewed strategies to help increase calories and protein in diet.  Will send written information to patient. Encouraged patient to continue to drink 360 calorie boost shake if tolerated 2 times per day or more if able.  Will mail coupons.  Encouraged small frequent meals, eating q 1-2 hours.  May need bowel regimen.  Encouraged patient to discuss with Dr. Mike Gip.    MONITORING, EVALUATION, GOAL: Patient will consume adequate calories and protein to prevent further weight loss   NEXT VISIT: phone f/u March 1  Chris Mcdonald, [place], [place] Registered Dietitian [phone] (pager)

## 2019-05-26 ENCOUNTER — Other Ambulatory Visit: Payer: Self-pay

## 2019-05-26 ENCOUNTER — Other Ambulatory Visit: Payer: Self-pay | Admitting: Hematology and Oncology

## 2019-05-26 DIAGNOSIS — C220 Liver cell carcinoma: Secondary | ICD-10-CM

## 2019-05-26 MED ORDER — LENVIMA (8 MG DAILY DOSE) 2 X 4 MG PO CPPK: 8.0000 mg | ORAL_CAPSULE | Freq: Every day | ORAL | 0 refills | Status: DC

## 2019-05-26 NOTE — Telephone Encounter (Signed)
CBC with Differential/Platelet Order: [id number] Status:  Final result  Visible to patient:  No (inaccessible in MyChart)  Next appt:  06/14/2019 at 09:45 AM in Oncology ([facility])  Dx:  Hepatocellular carcinoma ([place])  Ref Range & Units 3 wk ago  WBC 4.0 - 10.5 K/uL 5.0   RBC 4.22 - 5.81 MIL/uL 5.34   Hemoglobin 13.0 - 17.0 g/dL 16.3   HCT 39.0 - 52.0 % 47.4   MCV 80.0 - 100.0 fL 88.8   MCH 26.0 - 34.0 pg 30.5   MCHC 30.0 - 36.0 g/dL 34.4   RDW 11.5 - 15.5 % 14.7   Platelets 150 - 400 K/uL 128Low    nRBC 0.0 - 0.2 % 0.0   Neutrophils Relative % % 54   Neutro Abs 1.7 - 7.7 K/uL 2.7   Lymphocytes Relative % 37   Lymphs Abs 0.7 - 4.0 K/uL 1.9   Monocytes Relative % 5   Monocytes Absolute 0.1 - 1.0 K/uL 0.3   Eosinophils Relative % 3   Eosinophils Absolute 0.0 - 0.5 K/uL 0.2   Basophils Relative % 1   Basophils Absolute 0.0 - 0.1 K/uL 0.0   Immature Granulocytes % 0   Abs Immature Granulocytes 0.00 - 0.07 K/uL 0.01   Comment: Performed at [facility], [address] [place], [place] [address]  Resulting Agency  [facility]      Specimen Collected: 05/05/19 08:28  Last Resulted: 05/05/19 08:48     Lab Flowsheet   Order Details   View Encounter   Lab and Collection Details   Routing   Result History       Result Communications  Follow-ups   05/06/2019 Telephone      Other Results from 05/05/2019  Contains abnormal data AFP tumor marker  Status:  Final result  Visible to patient:  No (inaccessible in MyChart)  Next appt:  06/14/2019 at 09:45 AM in Oncology ([facility])  Dx:  Hepatocellular carcinoma ([place]) Order: [id number]  Ref Range & Units 3 wk ago  AFP, Serum, Tumor Marker 0.0 - 8.3 ng/mL 500.0High    Comment: (NOTE)  Roche Diagnostics Electrochemiluminescence Immunoassay (ECLIA)  Values obtained with different assay methods or kits cannot be  used interchangeably. Results cannot be interpreted as absolute  evidence of the  presence or absence of malignant disease.  This test is not interpretable in pregnant females.  Performed At: [facility]  [place], [place] [id number]  Rush Farmer MD [id number]   Resulting Agency  [facility]      Specimen Collected: 05/05/19 08:28  Last Resulted: 05/06/19 06:37     Lab Flowsheet   Order Details   View Encounter   Lab and Collection Details   Routing   Result History       Result Communications  Follow-ups   05/06/2019 Telephone        Contains abnormal data Comprehensive metabolic panel  Status:  Final result  Visible to patient:  No (inaccessible in MyChart)  Next appt:  06/14/2019 at 09:45 AM in Oncology ([facility])  Dx:  Hepatocellular carcinoma ([place]) Order: [id number]   (important suggestion)  Newer results are available. Click to view them now.   Ref Range & Units 3 wk ago  Sodium 135 - 145 mmol/L 137   Potassium 3.5 - 5.1 mmol/L 3.1Low    Chloride 98 - 111 mmol/L 101   CO2 22 - 32 mmol/L 25   Glucose, Bld  70 - 99 mg/dL 104High    BUN 8 - 23 mg/dL 18   Creatinine, Ser 0.61 - 1.24 mg/dL 0.91   Calcium 8.9 - 10.3 mg/dL 9.4   Total Protein 6.5 - 8.1 g/dL 8.2High    Albumin 3.5 - 5.0 g/dL 4.5   AST 15 - 41 U/L 46High    ALT 0 - 44 U/L 40   Alkaline Phosphatase 38 - 126 U/L 109   Total Bilirubin 0.3 - 1.2 mg/dL 1.2   GFR calc non Af Amer >60 mL/min >60   GFR calc Af Amer >60 mL/min >60   Anion gap 5 - 15 11   Comment: Performed at [facility], [address], [place], [place] [address]  Resulting Agency  [facility] LAB      Specimen Collected: 05/05/19 08:28  Last Resulted: 05/05/19 08:54

## 2019-05-27 ENCOUNTER — Telehealth: Payer: Self-pay

## 2019-05-27 DIAGNOSIS — E876 Hypokalemia: Secondary | ICD-10-CM

## 2019-05-27 NOTE — Telephone Encounter (Signed)
Spoke with Chris Mcdonald to inform him that Dr Mike Gip refilled the potassium 20 meq on 05/12/2019  # 40. The patient he has not picked up that refill and he will call the pharmacy. I have informed the patient if they don't have it please give me a call back at the office to get the potassium refilled.

## 2019-06-11 ENCOUNTER — Other Ambulatory Visit: Payer: Self-pay

## 2019-06-14 ENCOUNTER — Other Ambulatory Visit: Payer: Self-pay

## 2019-06-14 ENCOUNTER — Inpatient Hospital Stay: Payer: Managed Care, Other (non HMO)

## 2019-06-14 ENCOUNTER — Telehealth: Payer: Self-pay

## 2019-06-14 DIAGNOSIS — I158 Other secondary hypertension: Secondary | ICD-10-CM

## 2019-06-14 DIAGNOSIS — K30 Functional dyspepsia: Secondary | ICD-10-CM | POA: Diagnosis not present

## 2019-06-14 DIAGNOSIS — M1990 Unspecified osteoarthritis, unspecified site: Secondary | ICD-10-CM | POA: Diagnosis not present

## 2019-06-14 DIAGNOSIS — R590 Localized enlarged lymph nodes: Secondary | ICD-10-CM | POA: Diagnosis not present

## 2019-06-14 DIAGNOSIS — E876 Hypokalemia: Secondary | ICD-10-CM

## 2019-06-14 DIAGNOSIS — E860 Dehydration: Secondary | ICD-10-CM | POA: Diagnosis not present

## 2019-06-14 DIAGNOSIS — Z8619 Personal history of other infectious and parasitic diseases: Secondary | ICD-10-CM | POA: Diagnosis not present

## 2019-06-14 DIAGNOSIS — R112 Nausea with vomiting, unspecified: Secondary | ICD-10-CM

## 2019-06-14 DIAGNOSIS — C220 Liver cell carcinoma: Secondary | ICD-10-CM

## 2019-06-14 DIAGNOSIS — R634 Abnormal weight loss: Secondary | ICD-10-CM | POA: Diagnosis not present

## 2019-06-14 DIAGNOSIS — R1011 Right upper quadrant pain: Secondary | ICD-10-CM | POA: Diagnosis not present

## 2019-06-14 DIAGNOSIS — R1110 Vomiting, unspecified: Secondary | ICD-10-CM | POA: Diagnosis not present

## 2019-06-14 DIAGNOSIS — Z8673 Personal history of transient ischemic attack (TIA), and cerebral infarction without residual deficits: Secondary | ICD-10-CM | POA: Diagnosis not present

## 2019-06-14 DIAGNOSIS — I10 Essential (primary) hypertension: Secondary | ICD-10-CM | POA: Diagnosis not present

## 2019-06-14 DIAGNOSIS — Z79899 Other long term (current) drug therapy: Secondary | ICD-10-CM | POA: Diagnosis not present

## 2019-06-14 DIAGNOSIS — F17210 Nicotine dependence, cigarettes, uncomplicated: Secondary | ICD-10-CM | POA: Diagnosis not present

## 2019-06-14 DIAGNOSIS — R918 Other nonspecific abnormal finding of lung field: Secondary | ICD-10-CM

## 2019-06-14 DIAGNOSIS — Z7982 Long term (current) use of aspirin: Secondary | ICD-10-CM | POA: Diagnosis not present

## 2019-06-14 LAB — CBC WITH DIFFERENTIAL/PLATELET
Abs Immature Granulocytes: 0.01 10*3/uL (ref 0.00–0.07)
Basophils Absolute: 0 10*3/uL (ref 0.0–0.1)
Basophils Relative: 1 %
Eosinophils Absolute: 0.2 10*3/uL (ref 0.0–0.5)
Eosinophils Relative: 3 %
HCT: 46.7 % (ref 39.0–52.0)
Hemoglobin: 16.1 g/dL (ref 13.0–17.0)
Immature Granulocytes: 0 %
Lymphocytes Relative: 33 %
Lymphs Abs: 1.6 10*3/uL (ref 0.7–4.0)
MCH: 31.7 pg (ref 26.0–34.0)
MCHC: 34.5 g/dL (ref 30.0–36.0)
MCV: 91.9 fL (ref 80.0–100.0)
Monocytes Absolute: 0.3 10*3/uL (ref 0.1–1.0)
Monocytes Relative: 6 %
Neutro Abs: 2.7 10*3/uL (ref 1.7–7.7)
Neutrophils Relative %: 57 %
Platelets: 125 10*3/uL — ABNORMAL LOW (ref 150–400)
RBC: 5.08 MIL/uL (ref 4.22–5.81)
RDW: 14.2 % (ref 11.5–15.5)
WBC: 4.8 10*3/uL (ref 4.0–10.5)
nRBC: 0 % (ref 0.0–0.2)

## 2019-06-14 LAB — COMPREHENSIVE METABOLIC PANEL
ALT: 41 U/L (ref 0–44)
AST: 51 U/L — ABNORMAL HIGH (ref 15–41)
Albumin: 4.5 g/dL (ref 3.5–5.0)
Alkaline Phosphatase: 130 U/L — ABNORMAL HIGH (ref 38–126)
Anion gap: 9 (ref 5–15)
BUN: 17 mg/dL (ref 8–23)
CO2: 27 mmol/L (ref 22–32)
Calcium: 9.3 mg/dL (ref 8.9–10.3)
Chloride: 97 mmol/L — ABNORMAL LOW (ref 98–111)
Creatinine, Ser: 0.82 mg/dL (ref 0.61–1.24)
GFR calc Af Amer: >60 mL/min (ref >60)
GFR calc non Af Amer: >60 mL/min (ref >60)
Glucose, Bld: 89 mg/dL (ref 70–99)
Potassium: 3 mmol/L — ABNORMAL LOW (ref 3.5–5.1)
Sodium: 133 mmol/L — ABNORMAL LOW (ref 135–145)
Total Bilirubin: 0.8 mg/dL (ref 0.3–1.2)
Total Protein: 8.2 g/dL — ABNORMAL HIGH (ref 6.5–8.1)

## 2019-06-14 LAB — MAGNESIUM: Magnesium: 2 mg/dL (ref 1.7–2.4)

## 2019-06-14 NOTE — Telephone Encounter (Signed)
Unable to leave a message . i have reached out to the patient to see if he was taking his potassium and how much is he taking. Any diarrhea. I will try to reach the patient again tomorrow.

## 2019-06-14 NOTE — Telephone Encounter (Signed)
-----   Message from Lequita Asal, MD sent at 06/14/2019  3:41 PM EST ----- Regarding: Please call patient  Potassium is 3.0.  Is he taking his oral potassium?  How much?  Any diarrhea?  M ----- Message ----- From: Buel Ream, [facility] [place] Sent: 06/14/2019   9:44 AM EST To: Lequita Asal, MD

## 2019-06-15 ENCOUNTER — Ambulatory Visit
Admission: RE | Admit: 2019-06-15 | Discharge: 2019-06-15 | Disposition: A | Discharge status: Home / Self Care | Payer: Managed Care, Other (non HMO) | Source: Ambulatory Visit | Attending: Hematology and Oncology | Admitting: Hematology and Oncology

## 2019-06-15 ENCOUNTER — Other Ambulatory Visit: Payer: Self-pay

## 2019-06-15 ENCOUNTER — Telehealth: Payer: Self-pay | Admitting: *Deleted

## 2019-06-15 DIAGNOSIS — C220 Liver cell carcinoma: Secondary | ICD-10-CM | POA: Diagnosis present

## 2019-06-15 LAB — AFP TUMOR MARKER: AFP, Serum, Tumor Marker: 501 ng/mL — ABNORMAL HIGH (ref 0.0–8.3)

## 2019-06-15 MED ORDER — GADOBUTROL 1 MMOL/ML IV SOLN
5.0000 mL | Freq: Once | INTRAVENOUS | Status: AC | PRN
  Administered 2019-06-15: 10:00:00 5 mL via INTRAVENOUS

## 2019-06-15 NOTE — Telephone Encounter (Signed)
Called report  IMPRESSION: 1. Slight increase in peripheral biliary ductal dilation surrounding the infiltrative central hepatic neoplasm. Portal vein involvement may be slightly improved compared to the prior study. 2. No significant change in size or new lesion. 3. Suggestion of subcarinal adenopathy though assessment of the chest on MRI is quite limited best seen on coronal image 15 of series. Consider chest CT for further evaluation. 4. Question of colonic thickening, could herald mild colitis. Correlate clinically.   Electronically Signed   By: Zetta Bills M.D.   On: 06/15/2019 14:28

## 2019-06-15 NOTE — Progress Notes (Signed)
[facility]  [address] [place], [place] [address] Phone: [phone]  Fax: [phone]   Clinic Day:  06/17/2019  Referring physician: Donnie Coffin, MD  Chief Complaint: Chris Mcdonald is a 64 y.o. male with stage IIIB hepatocellular carcinomaon lenvatinib who is seen for a 1 month assessment and review of interval abdominal MRI.  HPI: The patient was last seen in the medical oncology clinic on 05/12/2019. At that time, he was feeling better with minimal nausea (back to baseline). Potassium was 3.6. Oral potassium was increased to 2 pills/day x 3 days.   Patient had a telephone visit with Jennet Maduro, RD on 05/24/2019. He was eating well and drinking Boost 1-2 times per day. He noted abdominal pain, not exacerbated by and foods. Bowel movements occurred 1-2 times per week. He had some nausea and was using ondansetron 1-2 times per day. He was encouraged to eat small frequent meals and continue drinking Boost.   Labs on 06/14/2019 included hematocrit 46.7, hemoglobin 16.1, platelets 125,000, WBC 4,800. Sodium 133, potassium 3.0, AST 51, alkaline phosphatase 130. AFP was 501.0 (stable).   Abdominal MRI on 06/15/2019 showed slight increase in peripheral biliary ductal dilation surrounding the infiltrative central hepatic neoplasm. Portal vein involvement may be slightly improved compared to the prior study. There was no significant change in size or new lesion.  There was suggestion of subcarinal adenopathy though assessment of the chest on MRI was limited.  Consider chest CT for further evaluation.  There was question of colonic thickening, which could herald mild colitis. Correlate clinically.  During the interim, he has been feeling sick. He has RUQ abdominal pain (7/10) that last all day. He has been vomiting at least once daily. He denies any diarrhea. The patient is on oral potassium 20 meq a day. His weight is down 6 lbs. He reports that he is eating  well. Yesterday he ate grits and [employer] for dinner. He notes he ran out of his nausea medication. He associates his GI upset with lenvatinib (when he was out of his lenvatinib, he had no GI symptoms).   We discussed decreasing his dosage of lenvatinib to 4 mg a day. He is interested in having samples of Ensure and Boost.   He notes the reason why the clinic has trouble getting in touch with him is because his room is a dead zone for cell phone service.  I encouraged him to keep his phone in another section of the house.   Past Medical History:  Diagnosis Date  . Arthritis   . Hepatitis   . Hypertension   . Spinal stenosis   . Stroke [facility])     Past Surgical History:  Procedure Laterality Date  . COLONOSCOPY WITH PROPOFOL N/A 02/21/2015   Procedure: COLONOSCOPY WITH PROPOFOL;  Surgeon: Lollie Sails, MD;  Location: [facility] ENDOSCOPY;  Service: Endoscopy;  Laterality: N/A;  . INGUINAL HERNIA REPAIR Bilateral   . IR RADIOLOGIST EVAL & MGMT  08/04/2018  . POSTERIOR CERVICAL VERTEBRAE EXCISION      Family History  Problem Relation Age of Onset  . Cancer Brother   . Heart disease Brother     Social History:  reports that he has been smoking cigarettes. He has a 75.00 pack-year smoking history. He has never used smokeless tobacco. He reports that he does not drink alcohol or use drugs. He lives in [place] with his wife and daughter.He has a 64 year old daughter, 20 year old  son, and 26 year old son. The patient is alone today.  Allergies: No Known Allergies  Current Medications: Current Outpatient Medications  Medication Sig Dispense Refill  . amLODipine (NORVASC) 5 MG tablet Take 10 mg by mouth daily.     Marland Kitchen aspirin EC 81 MG tablet Take 81 mg by mouth daily.     . fluticasone (FLONASE) 50 MCG/ACT nasal spray 1 spray by Each Nare route daily.    . hydrochlorothiazide (HYDRODIURIL) 12.5 MG tablet Take 25 mg by mouth daily.     Marland Kitchen lenvatinib 8 mg daily dose (LENVIMA, 8 MG DAILY  DOSE,) 2 x 4 MG capsule Take 2 capsules (8 mg total) by mouth daily. 60 capsule 0  . ondansetron (ZOFRAN) 4 MG tablet Take 1 tablet (4 mg total) by mouth every 8 (eight) hours as needed for nausea or vomiting. 20 tablet 0  . potassium chloride SA (KLOR-CON M20) 20 MEQ tablet TAKE 1 TABLET (20 MEQ TOTAL) BY MOUTH DAILY. AS DIRECTED BY MD 40 tablet 0   No current facility-administered medications for this visit.    Review of Systems  Constitutional: Positive for weight loss (6 lbs). Negative for chills, fever and malaise/fatigue.       Feeling sick.  HENT: Negative.  Negative for congestion, ear pain, nosebleeds, sinus pain, sore throat and tinnitus.   Eyes: Negative.  Negative for blurred vision and double vision.  Respiratory: Negative for cough, hemoptysis, sputum production and shortness of breath.   Cardiovascular: Negative.  Negative for chest pain, palpitations and leg swelling.  Gastrointestinal: Positive for abdominal pain (RUQ and epigastric area), nausea and vomiting (once a day). Negative for constipation and diarrhea.       Eating well.  Genitourinary: Negative.  Negative for dysuria, frequency and urgency.  Musculoskeletal: Negative.  Negative for back pain, falls, myalgias and neck pain.  Skin: Negative.  Negative for rash.  Neurological: Negative for tingling, tremors, sensory change, focal weakness, weakness and headaches.  Endo/Heme/Allergies: Negative.  Does not bruise/bleed easily.  Psychiatric/Behavioral: Negative.  Negative for depression. The patient is not nervous/anxious and does not have insomnia.   All other systems reviewed and are negative.  Performance status (ECOG):  1  Vitals Blood pressure 127/79, pulse 77, temperature (!) 95.4 F (35.2 C), temperature source Tympanic, resp. rate 18, height '5\' 7"'$  (1.702 m), weight 113 lb 8.6 oz (51.5 kg), SpO2 100 %.   Physical Exam  Constitutional: He is oriented to person, place, and time. No distress.  Thin gentleman  sitting comfortably in the exam room in no acute distress.  HENT:  Head: Normocephalic and atraumatic.  Mouth/Throat: Oropharynx is clear and moist. No oropharyngeal exudate.  Short brown hair.  Edentulous.  Temporal wasting.  Mask.  Eyes: Pupils are equal, round, and reactive to light. Conjunctivae and EOM are normal. No scleral icterus.  Blue eyes.  Neck: No JVD present.  Cardiovascular: Normal rate, regular rhythm and normal heart sounds. Exam reveals no gallop.  No murmur heard. Pulmonary/Chest: Effort normal and breath sounds normal. No respiratory distress. He has no wheezes. He has no rales.  Abdominal: Soft. Bowel sounds are normal. He exhibits no distension and no mass. There is no splenomegaly or hepatomegaly. There is no abdominal tenderness. There is no rebound and no guarding.  Musculoskeletal:        General: No tenderness or edema. Normal range of motion.     Cervical back: Normal range of motion and neck supple.  Lymphadenopathy:  Head (right side): No preauricular, no posterior auricular and no occipital adenopathy present.       Head (left side): No preauricular, no posterior auricular and no occipital adenopathy present.    He has no cervical adenopathy.    He has no axillary adenopathy.       Right: No inguinal and no supraclavicular adenopathy present.       Left: No inguinal and no supraclavicular adenopathy present.  Neurological: He is alert and oriented to person, place, and time.  Skin: Skin is warm and dry. No rash noted. He is not diaphoretic. No erythema. No pallor.  Psychiatric: He has a normal mood and affect. His behavior is normal. Judgment and thought content normal.  Nursing note and vitals reviewed.   Imaging studies: 06/19/2019:  Low dose chest CTrevealed Lung-RADS 4A-S, suspicious. There was a new centrilobular nodularity in both lungs, more likely inflammatory. Three was a 8.5 x 5.7 cm poorly marginated geographic region of low attenuation  in the superior right liver, new since 06/17/2017.  09/16/2018:  Chest CTrevealed aLung-RADSscore of2, benign appearance or behavior. There was an ill-defined liver mass felt to represent a hepatoma on 07/10/2018, in the setting of cirrhosis. 07/10/2018:  Abdomen MRI revealed an 8.8 cm heterogeneous infiltrating segment 8. Findings were consistent with a hepatoma, given the underlying cirrhotic changes involving the liver. This was also invading and obstructing the middle and right portal veins and the middle hepatic vein. There were scattered abdominal lymph nodes but no definite findings for metastatic disease. 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 There was no evidence of new liver lesions or abdominal metastasis. 03/15/2019:  Abdominal MRI revealed no significant change in poorly defined mass in segment 8 of the right lobe, with persistent tumor thrombus throughout the right portal vein.  There was no new liver lesions or abdominal metastatic disease. 06/15/2019:  Abdominal MRI revealed slight increase in peripheral biliary ductal dilation surrounding the infiltrative central hepatic neoplasm. Portal vein involvement may be slightly improved compared to the prior study. There was no significant change in size or new lesion.  There was suggestion of subcarinal adenopathy though assessment of the chest on MRI was limited.  Consider chest CT for further evaluation.  There was question of colonic thickening, which could herald mild colitis. Correlate clinically.   No visits with results within 3 Day(s) from this visit.  Latest known visit with results is:  Appointment on 06/14/2019  Component Date Value Ref Range Status  . WBC 06/14/2019 4.8  4.0 - 10.5 K/uL Final  . RBC 06/14/2019 5.08  4.22 - 5.81 MIL/uL Final  . Hemoglobin 06/14/2019 16.1  13.0 - 17.0 g/dL Final  .  HCT 06/14/2019 46.7  39.0 - 52.0 % Final  . MCV 06/14/2019 91.9  80.0 - 100.0 fL Final  . MCH 06/14/2019 31.7  26.0 - 34.0 pg Final  . MCHC 06/14/2019 34.5  30.0 - 36.0 g/dL Final  . RDW 06/14/2019 14.2  11.5 - 15.5 % Final  . Platelets 06/14/2019 125* 150 - 400 K/uL Final  . nRBC 06/14/2019 0.0  0.0 - 0.2 % Final  . Neutrophils Relative % 06/14/2019 57  % Final  . Neutro Abs 06/14/2019 2.7  1.7 - 7.7 K/uL Final  . Lymphocytes Relative 06/14/2019 33  % Final  . Lymphs Abs 06/14/2019 1.6  0.7 - 4.0 K/uL Final  . Monocytes Relative 06/14/2019  6  % Final  . Monocytes Absolute 06/14/2019 0.3  0.1 - 1.0 K/uL Final  . Eosinophils Relative 06/14/2019 3  % Final  . Eosinophils Absolute 06/14/2019 0.2  0.0 - 0.5 K/uL Final  . Basophils Relative 06/14/2019 1  % Final  . Basophils Absolute 06/14/2019 0.0  0.0 - 0.1 K/uL Final  . Immature Granulocytes 06/14/2019 0  % Final  . Abs Immature Granulocytes 06/14/2019 0.01  0.00 - 0.07 K/uL Final   Performed at [facility], [address], [place], [place] [address]  . Sodium 06/14/2019 133* 135 - 145 mmol/L Final  . Potassium 06/14/2019 3.0* 3.5 - 5.1 mmol/L Final  . Chloride 06/14/2019 97* 98 - 111 mmol/L Final  . CO2 06/14/2019 27  22 - 32 mmol/L Final  . Glucose, Bld 06/14/2019 89  70 - 99 mg/dL Final  . BUN 06/14/2019 17  8 - 23 mg/dL Final  . Creatinine, Ser 06/14/2019 0.82  0.61 - 1.24 mg/dL Final  . Calcium 06/14/2019 9.3  8.9 - 10.3 mg/dL Final  . Total Protein 06/14/2019 8.2* 6.5 - 8.1 g/dL Final  . Albumin 06/14/2019 4.5  3.5 - 5.0 g/dL Final  . AST 06/14/2019 51* 15 - 41 U/L Final  . ALT 06/14/2019 41  0 - 44 U/L Final  . Alkaline Phosphatase 06/14/2019 130* 38 - 126 U/L Final  . Total Bilirubin 06/14/2019 0.8  0.3 - 1.2 mg/dL Final  . GFR calc non Af Amer 06/14/2019 >60  >60 mL/min Final  . GFR calc Af Amer 06/14/2019 >60  >60 mL/min Final  . Anion gap 06/14/2019 9  5 - 15 Final   Performed at [facility]  [facility], [address] [place], [place] [address]  . Magnesium 06/14/2019 2.0  1.7 - 2.4 mg/dL Final   Performed at [facility], [address], [place], [place] [address]  . AFP, Serum, Tumor Marker 06/14/2019 501.0* 0.0 - 8.3 ng/mL Final   Comment: (NOTE) Roche [employer]ostics Electrochemiluminescence Immunoassay (ECLIA) Values obtained with different assay methods or kits cannot be used interchangeably.  Results cannot be interpreted as absolute evidence of the presence or absence of malignant disease. This test is not interpretable in pregnant females. Performed At: [facility] [place], [place] [id number] Rush Farmer MD IR:[id number]     Assessment:  Chris Mcdonald is a 64 y.o. male with unresectable hepatocellular carcinoma. He has a history of hepatitis C(treated with Harvoni in 04/11/2015). He has hepatitis B. Mass was discovered incidentally on low dose chest CT screenig program. AFPwas 7329. Child-Pugh ClassisA (score 5).  Low dose chest CTon 06/18/2018 revealed Lung-RADS 4A-S, suspicious. There was a new centrilobular nodularity in both lungs, more likely inflammatory. Three was a 8.5 x 5.7 cm poorly marginated geographic region of low attenuation in the superior right liver, new since 06/17/2017. Chest CTon 09/16/2018 revealed aLung-RADSscore of2, benign appearance or behavior. There was an ill-defined liver mass felt to represent a hepatoma on 07/10/2018, in the setting of cirrhosis.  Abdomen MRI on 07/10/2018 revealed an 8.8 cm heterogeneous infiltrating segment 8. Findings were consistent with a hepatoma, given the underlying cirrhotic changes involving the liver. This was also invading and obstructing the middle and right portal veins and the middle hepatic vein. There were scattered abdominal lymph nodes but no definite findings for metastatic disease.  AFPhas been followed: 3016 on 07/27/2018,6250 on 08/17/2018, 467 on  11/18/2018, 271 on 12/29/2018, 2320 on 02/08/2019, 1548 on 03/08/2019, 721  on 04/07/2019, 500 on 05/05/2019, and 501.0 on 022/2021.  He began lenvatinibon 08/25/2018.He was off lenvatinib from 12/27/2018 - 02/14/2019.  He has continued lenvatinib.  Abdominal MRI on 06/15/2019 showed slight increase in peripheral biliary ductal dilation surrounding the infiltrative central hepatic neoplasm. Portal vein involvement may be slightly improved compared to the prior study. There was no significant change in size or new lesion.  There was suggestion of subcarinal adenopathy.  There was question of colonic thickening, which could herald mild colitis.  He has a history of hypercalcemia. Calcium was 11.0 on 08/17/2018. PTH and PTH-rp were low on 08/19/2018.Calcium was 9.3 on 11/30/2018.  Symptomatically, he has had nausea and abdominal discomfort.  Bowel movements are normal.  He has lost 6 pounds.  Exam reveals no guarding or rebound tenderness.  Plan: 1.   Review labs from 06/14/2019.  2.   Labs today:  BMP, amylase, lipase. 3. Hepatocellular carcinoma Clinically, he has GI upset possibly related to lenvatinib.  He notes RUQ and epigastric discomfort.                         Nausea secondary to lenvatinib originally improved with ondansetron.    Patient ran out of ondansetron.    Rx:  ondansetron 8 mg po q 8 hours prn nausea. AFP initially improved from 1548 to 721 to 500 then stablized over past 4 months. Abdominal MRI on 06/15/2019 was personally reviewed.  Agree with radiology interpretation.   Images discussed with radiology.   Liver lesion appears stable.  Next imaging in 3 months unless increasing AFP.   Discuss plan for follow-up chest imaging.  Decrease lenvatinib to 4 mg a day secondary to GI issues.  EKG today (periodic check secondary to risk of increased QTc). 4. Abdominal discomfort  Discomfort in RUQ (liver)  and epigastric area.  Abdominal pain incidence approximately 30% with lenvatinib.\  Check amylase and lipase.  Rx:  omeprazole 20 mg a day (dis: #30).  Decrease lenvatinib dose to 4 mg a day.   5.   Weight loss and dehydration  Potassium 3.2.  IVF 500 cc NS + 20 meq KCL- patient felt better.  Continue potassium 20 meq po q day.  Patient has met with nutrition.  Samples of Ensure and Boost today. 6.   Hypertension Blood pressure127/79. He has baseline hypertension.  Lenvatinib is exacerbated by lenvatinib. He is currently on HCTZ 25 mg a day and amlodipine 10 mg a day. HCTZ results in hypokalemia.  Continue to monitor. 7.   RTC in 1 week for MD assessment, labs (CMP), and +/- IVF.  I discussed the assessment and treatment plan with the patient.  The patient was provided an opportunity to ask questions and all were answered.  The patient agreed with the plan and demonstrated an understanding of the instructions.  The patient was advised to call back if the symptoms worsen or if the condition fails to improve as anticipated.   Lequita Asal, MD, PhD    06/17/2019, 9:40 AM  I, Selena Batten, am acting as scribe for [employer]. Mike Gip, MD, PhD.  I, Kyllie Pettijohn C. Mike Gip, MD, have reviewed the above documentation for accuracy and completeness, and I agree with the above.

## 2019-06-17 ENCOUNTER — Inpatient Hospital Stay (HOSPITAL_BASED_OUTPATIENT_CLINIC_OR_DEPARTMENT_OTHER): Payer: Managed Care, Other (non HMO) | Admitting: Hematology and Oncology

## 2019-06-17 ENCOUNTER — Other Ambulatory Visit: Payer: Self-pay

## 2019-06-17 ENCOUNTER — Encounter: Payer: Self-pay | Admitting: Hematology and Oncology

## 2019-06-17 ENCOUNTER — Inpatient Hospital Stay: Payer: Managed Care, Other (non HMO)

## 2019-06-17 ENCOUNTER — Ambulatory Visit
Admission: RE | Admit: 2019-06-17 | Discharge: 2019-06-17 | Disposition: A | Discharge status: Home / Self Care | Payer: Managed Care, Other (non HMO) | Source: Ambulatory Visit | Attending: Hematology and Oncology | Admitting: Hematology and Oncology

## 2019-06-17 VITALS — BP 127/79 | HR 77 | Temp 95.4°F | Resp 18 | Ht 67.0 in | Wt 113.5 lb

## 2019-06-17 DIAGNOSIS — R918 Other nonspecific abnormal finding of lung field: Secondary | ICD-10-CM | POA: Diagnosis not present

## 2019-06-17 DIAGNOSIS — R112 Nausea with vomiting, unspecified: Secondary | ICD-10-CM | POA: Diagnosis not present

## 2019-06-17 DIAGNOSIS — R1013 Epigastric pain: Secondary | ICD-10-CM | POA: Diagnosis not present

## 2019-06-17 DIAGNOSIS — R634 Abnormal weight loss: Secondary | ICD-10-CM

## 2019-06-17 DIAGNOSIS — C220 Liver cell carcinoma: Secondary | ICD-10-CM

## 2019-06-17 DIAGNOSIS — E876 Hypokalemia: Secondary | ICD-10-CM

## 2019-06-17 LAB — BASIC METABOLIC PANEL
Anion gap: 11 (ref 5–15)
BUN: 15 mg/dL (ref 8–23)
CO2: 25 mmol/L (ref 22–32)
Calcium: 9.2 mg/dL (ref 8.9–10.3)
Chloride: 98 mmol/L (ref 98–111)
Creatinine, Ser: 0.7 mg/dL (ref 0.61–1.24)
GFR calc Af Amer: >60 mL/min (ref >60)
GFR calc non Af Amer: >60 mL/min (ref >60)
Glucose, Bld: 107 mg/dL — ABNORMAL HIGH (ref 70–99)
Potassium: 3.2 mmol/L — ABNORMAL LOW (ref 3.5–5.1)
Sodium: 134 mmol/L — ABNORMAL LOW (ref 135–145)

## 2019-06-17 LAB — LIPASE, BLOOD: Lipase: 28 U/L (ref 11–51)

## 2019-06-17 LAB — AMYLASE: Amylase: 46 U/L (ref 28–100)

## 2019-06-17 MED ORDER — OMEPRAZOLE 20 MG PO CPDR: 20.0000 mg | DELAYED_RELEASE_CAPSULE | Freq: Every day | ORAL | 1 refills | Status: DC

## 2019-06-17 MED ORDER — SODIUM CHLORIDE 0.9 % IV SOLN
20.0000 meq | Freq: Once | INTRAVENOUS | Status: DC
  Filled 2019-06-17: qty 10

## 2019-06-17 MED ORDER — SODIUM CHLORIDE 0.9 % IV SOLN
Freq: Once | INTRAVENOUS | Status: AC
  Filled 2019-06-17: qty 250

## 2019-06-17 MED ORDER — SODIUM CHLORIDE 0.9 % IV SOLN
20.0000 meq | Freq: Once | INTRAVENOUS | Status: AC
  Administered 2019-06-17: 20 meq via INTRAVENOUS
  Filled 2019-06-17: qty 10

## 2019-06-17 MED ORDER — ONDANSETRON HCL 8 MG PO TABS: 8.0000 mg | ORAL_TABLET | Freq: Three times a day (TID) | ORAL | 0 refills | Status: DC | PRN

## 2019-06-17 MED ORDER — SODIUM CHLORIDE 0.9 % IV SOLN: 20.0000 meq | Freq: Once | INTRAVENOUS | Status: DC

## 2019-06-17 NOTE — Progress Notes (Signed)
Patient received 546ml of IVF with Potassium today in clinic. Patient states he feels better. VSS. Leaving this clinic to go up to [facility] for EKG at 4pm.

## 2019-06-17 NOTE — Progress Notes (Signed)
The patient is still c/o abdomen pain ( today 7). The patient also c/o vomiting at least once daily. No diarrhea and the patient states he is taking 20 meq of potassium daily. He has lost 6 lb since last visit.

## 2019-06-21 ENCOUNTER — Inpatient Hospital Stay: Payer: Managed Care, Other (non HMO) | Attending: Hematology and Oncology

## 2019-06-21 DIAGNOSIS — C220 Liver cell carcinoma: Secondary | ICD-10-CM | POA: Insufficient documentation

## 2019-06-21 DIAGNOSIS — E860 Dehydration: Secondary | ICD-10-CM | POA: Insufficient documentation

## 2019-06-21 DIAGNOSIS — Z8673 Personal history of transient ischemic attack (TIA), and cerebral infarction without residual deficits: Secondary | ICD-10-CM | POA: Insufficient documentation

## 2019-06-21 DIAGNOSIS — Z79899 Other long term (current) drug therapy: Secondary | ICD-10-CM | POA: Insufficient documentation

## 2019-06-21 DIAGNOSIS — F17210 Nicotine dependence, cigarettes, uncomplicated: Secondary | ICD-10-CM | POA: Insufficient documentation

## 2019-06-21 DIAGNOSIS — Z7982 Long term (current) use of aspirin: Secondary | ICD-10-CM | POA: Insufficient documentation

## 2019-06-21 DIAGNOSIS — I10 Essential (primary) hypertension: Secondary | ICD-10-CM | POA: Insufficient documentation

## 2019-06-21 NOTE — Progress Notes (Signed)
Nutrition Follow-up:  Patient with hepatocellular carcinoma on lenvatinib.  Noted dose reduced.  Spoke with patient via phone for nutrition follow-up. Patient reports that he thinks he has gained some weight back.  Reports that he has been taking nausea medications and it has helped.  Reports eating grits with butter yesterday for breakfast and brunswick stew later on in the day yesterday.  Patient is drinking 2 boost high protein (240 calorie shakes) per day. Reports they make him feel bloated.  Denies diarrhea.    Medications: reviewed  Labs: reviewed  Anthropometrics:   Weight 113 lb 8.6 on 2/25 decreased from 119 lb on 2/01.  Patient says he has gained weight since then  NUTRITION DIAGNOSIS: Inadequate oral intake improved per patient   INTERVENTION:  Encouraged boost plus (360 calorie shake) at least 2 per day. We discussed high calorie high protein foods that he likes to add into diet (peanut butter, chicken salad, tuna salad) Encouraged patient to take antinausea medications to help prevent symptom so he can eat.  Patient may benefit from trial of appetite stimulant Patient has contact information    MONITORING, EVALUATION, GOAL: Patient will consume adequate calories and protein to prevent weight loss   NEXT VISIT: phone f/u March 29th  Serge Main B. Zenia Resides, [place], [place] Registered Dietitian [phone] (pager)

## 2019-06-21 NOTE — Progress Notes (Signed)
[facility]  [address] [place], [place] [address] Phone: [phone]  Fax: [phone]   Clinic Day:  06/24/2019  Referring physician: Donnie Coffin, MD  Chief Complaint: Chris Mcdonald is a 64 y.o. male with stage IIIBhepatocellular carcinomaon lenvatinib who is seen for a 1 week assessment.  HPI: The patient was last seen in the medical oncology clinic on 06/17/2019. At that time, he had nausea and abdominal discomfort. Bowel movements were normal. He had lost 6 pounds. Exam revealed no guarding or rebound tenderness. Sodium 134. Potassium 3.2. Lipase 28. Amylase 46. His lenvatinib was decreased to 4 mg a day. He continued oral potassium 20 meq per day. Patient received 500 mL of IVF with potassium.  EKG revealed normal sinus rhythm with QT/QTc of 418/451 msec.  He had a follow up with Jennet Maduro, RD on 06/21/2019. He noted some weight gain. His nausea was controlled with medication. Her was drinking 2 Boost a day. Boost made him feel bloated.  He denied any diarrhea. He was encouraged to continue Boost daily and eat food high in calories and protein. He will follow up on 07/19/2019.   During the interim, he has felt "a little better". He has gained 3 pounds since last visit (06/17/2019). He is drinking Boost twice a day. He has mild abdominal pain. His vomiting has resolved. His nausea is managed with medication.  Patient denies any diarrhea. He has remained on oral potassium daily. He would like to be provided with more Boost samples today.   He denies any dizziness or lightheadedness. He feels like he does not require any fluids today. He reports an infection around the umbilicus and he has not notified his PCP yet.  He denies any fevers.    Past Medical History:  Diagnosis Date  . Arthritis   . Hepatitis   . Hypertension   . Spinal stenosis   . Stroke [facility])     Past Surgical History:  Procedure Laterality Date  . COLONOSCOPY WITH  PROPOFOL N/A 02/21/2015   Procedure: COLONOSCOPY WITH PROPOFOL;  Surgeon: Lollie Sails, MD;  Location: [facility] ENDOSCOPY;  Service: Endoscopy;  Laterality: N/A;  . INGUINAL HERNIA REPAIR Bilateral   . IR RADIOLOGIST EVAL & MGMT  08/04/2018  . POSTERIOR CERVICAL VERTEBRAE EXCISION      Family History  Problem Relation Age of Onset  . Cancer Brother   . Heart disease Brother     Social History:  reports that he has been smoking cigarettes. He has a 75.00 pack-year smoking history. He has never used smokeless tobacco. He reports that he does not drink alcohol or use drugs. lives in [place] with his wife and daughter.He has a 46 year old daughter, 78 year old son, and 76 year old son. The patient is alone today.  Allergies: No Known Allergies  Current Medications: Current Outpatient Medications  Medication Sig Dispense Refill  . amLODipine (NORVASC) 5 MG tablet Take 10 mg by mouth daily.     Marland Kitchen aspirin EC 81 MG tablet Take 81 mg by mouth daily.     . fluticasone (FLONASE) 50 MCG/ACT nasal spray 1 spray by Each Nare route daily.    . hydrochlorothiazide (HYDRODIURIL) 12.5 MG tablet Take 25 mg by mouth daily.     Marland Kitchen omeprazole (PRILOSEC) 20 MG capsule Take 1 capsule (20 mg total) by mouth daily. 30 capsule 1  . ondansetron (ZOFRAN) 8 MG tablet Take 1 tablet (8 mg total) by mouth every  8 (eight) hours as needed for nausea or vomiting. 20 tablet 0  . potassium chloride SA (KLOR-CON M20) 20 MEQ tablet TAKE 1 TABLET (20 MEQ TOTAL) BY MOUTH DAILY. AS DIRECTED BY MD 40 tablet 0  . lenvatinib 8 mg daily dose (LENVIMA, 8 MG DAILY DOSE,) 2 x 4 MG capsule Take 2 capsules (8 mg total) by mouth daily. (Patient not taking: Reported on 06/24/2019) 60 capsule 0   No current facility-administered medications for this visit.    Review of Systems  Constitutional: Negative for chills, fever, malaise/fatigue and weight loss (up 3 lbs).       Feels "better".  HENT: Negative.  Negative for congestion, ear pain,  hearing loss, nosebleeds, sinus pain, sore throat and tinnitus.   Eyes: Negative.  Negative for blurred vision and double vision.  Respiratory: Negative.  Negative for cough, hemoptysis, sputum production and shortness of breath.   Cardiovascular: Negative.  Negative for chest pain, palpitations and leg swelling.  Gastrointestinal: Positive for abdominal pain (RUQ and epigastric area; mild). Negative for blood in stool, constipation, diarrhea, heartburn, melena and vomiting (once a day; resolved). Nausea: controlled on medication.       Drinking boost x 2 per day.  Genitourinary: Negative.  Negative for dysuria, frequency and urgency.  Musculoskeletal: Negative.  Negative for back pain, falls, myalgias and neck pain.       Possible umbilicus infection.  Skin: Negative.  Negative for rash.  Neurological: Negative.  Negative for tingling, tremors, sensory change, focal weakness, weakness and headaches.  Endo/Heme/Allergies: Negative.  Does not bruise/bleed easily.  Psychiatric/Behavioral: Negative.  Negative for depression. The patient is not nervous/anxious and does not have insomnia.   All other systems reviewed and are negative.   Performance status (ECOG): 1  Vitals Blood pressure 129/86, pulse 64, temperature 97.8 F (36.6 C), temperature source Oral, resp. rate 18, weight 116 lb 6.5 oz (52.8 kg), SpO2 100 %.   Physical Exam  Constitutional: He is oriented to person, place, and time.  Thin gentleman sitting comfortably in the exam room in no acute distress.  HENT:  Head: Normocephalic and atraumatic.  Mouth/Throat: Oropharynx is clear and moist. No oropharyngeal exudate.  Short brown hair.  Edentulous.  Temporal wasting.  Mask.  Eyes: Pupils are equal, round, and reactive to light. Conjunctivae and EOM are normal. No scleral icterus.  Blue eyes.  Neck: No JVD present.  Cardiovascular: Normal rate, regular rhythm and normal heart sounds. Exam reveals no gallop.  No murmur  heard. Pulmonary/Chest: Effort normal and breath sounds normal. No respiratory distress. He has no wheezes. He has no rales.  Abdominal: Soft. Bowel sounds are normal. He exhibits no distension and no mass. There is no splenomegaly or hepatomegaly. There is no abdominal tenderness. There is no rebound and no guarding.  Edge of liver palpable.  Musculoskeletal:        General: No tenderness or edema. Normal range of motion.     Cervical back: Normal range of motion and neck supple.  Lymphadenopathy:       Head (right side): No preauricular, no posterior auricular and no occipital adenopathy present.       Head (left side): No preauricular, no posterior auricular and no occipital adenopathy present.    He has no cervical adenopathy.    He has no axillary adenopathy.       Right: No inguinal and no supraclavicular adenopathy present.       Left: No inguinal and no supraclavicular adenopathy  present.  Neurological: He is alert and oriented to person, place, and time.  Skin: Skin is warm and dry. No rash noted. He is not diaphoretic. No erythema. No pallor.  Psychiatric: He has a normal mood and affect. His behavior is normal. Judgment and thought content normal.  Nursing note and vitals reviewed.   Imaging studies: 06/19/2019:  Low dose chest CTrevealed Lung-RADS 4A-S, suspicious. There was a new centrilobular nodularity in both lungs, more likely inflammatory. Three was a 8.5 x 5.7 cm poorly marginated geographic region of low attenuation in the superior right liver, new since 06/17/2017.  09/16/2018:  Chest CTrevealed aLung-RADSscore of2, benign appearance or behavior. There was an ill-defined liver mass felt to represent a hepatoma on 07/10/2018, in the setting of cirrhosis. 07/10/2018:  Abdomen MRI revealed an 8.8 cm heterogeneous infiltrating segment 8. Findings were consistent with a hepatoma, given the underlying cirrhotic changes involving the liver. This was also invading and  obstructing the middle and right portal veins and the middle hepatic vein. There were scattered abdominal lymph nodes but no definite findings for metastatic disease. 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 There was no evidence of new liver lesions or abdominal metastasis. 03/15/2019:  Abdominal MRIrevealed no significant change in poorly defined mass in segment 8 of the right lobe, with persistent tumor thrombus throughout the right portal vein. There was no new liver lesions or abdominal metastatic disease. 06/15/2019:  Abdominal MRI revealed slight increase in peripheral biliary ductal dilation surrounding the infiltrative central hepatic neoplasm. Portal vein involvement may be slightly improved compared to the prior study. There was no significant change in size or new lesion.  There was suggestion of subcarinal adenopathy though assessment of the chest on MRI was limited.  Consider chest CT for further evaluation.  There was question of colonic thickening, which could herald mild colitis. Correlate clinically.   Appointment on 06/24/2019  Component Date Value Ref Range Status  . Sodium 06/24/2019 137  135 - 145 mmol/L Final  . Potassium 06/24/2019 3.4* 3.5 - 5.1 mmol/L Final  . Chloride 06/24/2019 97* 98 - 111 mmol/L Final  . CO2 06/24/2019 24  22 - 32 mmol/L Final  . Glucose, Bld 06/24/2019 98  70 - 99 mg/dL Final   Glucose reference range applies only to samples taken after fasting for at least 8 hours.  . BUN 06/24/2019 17  8 - 23 mg/dL Final  . Creatinine, Ser 06/24/2019 0.78  0.61 - 1.24 mg/dL Final  . Calcium 06/24/2019 9.6  8.9 - 10.3 mg/dL Final  . Total Protein 06/24/2019 7.7  6.5 - 8.1 g/dL Final  . Albumin 06/24/2019 4.2  3.5 - 5.0 g/dL Final  . AST 06/24/2019 57* 15 - 41 U/L Final  . ALT 06/24/2019 53* 0 - 44 U/L Final  . Alkaline Phosphatase  06/24/2019 136* 38 - 126 U/L Final  . Total Bilirubin 06/24/2019 0.7  0.3 - 1.2 mg/dL Final  . GFR calc non Af Amer 06/24/2019 >60  >60 mL/min Final  . GFR calc Af Amer 06/24/2019 >60  >60 mL/min Final  . Anion gap 06/24/2019 16* 5 - 15 Final   Performed at [facility], [address] [place], [place] [address]    Assessment:  Chris Mcdonald is a 64 y.o. male with unresectable hepatocellular carcinoma. He has a history of hepatitis C(treated with Harvoni in 04/11/2015). He has hepatitis B.  Mass was discovered incidentally on low dose chest CT screenig program. AFPwas 7329. Child-Pugh ClassisA (score 5).  Low dose chest CTon 06/18/2018 revealed Lung-RADS 4A-S, suspicious. There was a new centrilobular nodularity in both lungs, more likely inflammatory. Three was a 8.5 x 5.7 cm poorly marginated geographic region of low attenuation in the superior right liver, new since 06/17/2017. Chest CTon 09/16/2018 revealed aLung-RADSscore of2, benign appearance or behavior. There was an ill-defined liver mass felt to represent a hepatoma on 07/10/2018, in the setting of cirrhosis.  Abdomen MRI on 07/10/2018 revealed an 8.8 cm heterogeneous infiltrating segment 8. Findings were consistent with a hepatoma, given the underlying cirrhotic changes involving the liver. This was also invading and obstructing the middle and right portal veins and the middle hepatic vein. There were scattered abdominal lymph nodes but no definite findings for metastatic disease.  AFPhas been followed: [id number] on 07/27/2018,6250 on 08/17/2018, 467 on 11/18/2018, 271 on 12/29/2018, 2320 on 02/08/2019, 1548 on 03/08/2019, 721 on 04/07/2019, 500 on 05/05/2019, and 501.0 on 06/14/2019.  He began lenvatinibon 08/25/2018.He was off lenvatinibfrom09/09/2018 - 02/14/2019.  He has continued lenvatinib.  Lenvatinib was decreased to 4 mg a day on 06/17/2019.  Abdominal MRI on 06/15/2019 showed slight  increase in peripheral biliary ductal dilation surrounding the infiltrative central hepatic neoplasm. Portal vein involvement may be slightly improved compared to the prior study. There was no significant change in size or new lesion.  There was suggestion of subcarinal adenopathy.  There was question of colonic thickening, which could herald mild colitis.  He has a history of hypercalcemia. Calcium was 11.0 on 08/17/2018. PTH and PTH-rp were low on 08/19/2018.Calcium was 9.3 on 11/30/2018.  Symptomatically, his nausea and vomiting have resolved.  He has gained weight.  He has mild RUQ and epigastric discomfort.  Plan: 1.   Labs today: CMP. 2. Hepatocellular carcinoma Clinically, he has dramatically improved with decreased lenvatinib dosing.  Henotes only mild RUQ and epigastric discomfort. Discuss plan to continue reduced dose lenvatinib. AFP initially improved from 1548 to 721 to 500 then stablized over past 4 months. Abdominal MRI on 06/15/2019 revealed stable liver lesion    Next imaging in 3 months unless increasing AFP.             EKG on 06/17/2019 revealed no increased QTc.  Discuss symptom management.  He has antiemetics and pain medications at home to use on a prn bases.  Interventions are adequate.   . 3. Abdominal discomfort             He has mild discomfort in RUQ (liver) and epigastric area.   He is back to his baseline.             Abdominal pain incidence approximately 30% with lenvatinib.             Amylase and lipase were normal at last visit.             Discomfort and quality of life improved with dose reduction of lenvatinib.     He has not picked up omeprazole.  Discuss prn use.      4.   Weight loss and dehydration             Weight up 3 pounds secondary to resolution of nausea and vomiting.  Potassium 3.4.             No IVF needed today.             Continue potassium  20 meq po q day.             Samples of Boost today. 5.   Hypertension Blood pressure129/86. He has baseline hypertension.    He is onHCTZ 25 mg a day and amlodipine 10 mg a day. Lenvatinibisexacerbated by lenvatinib. Continue to monitor. 6.   RTC in 1 week for labs (CMP). 7.   RTC in 3 weeks for MD assessment and labs (CBC with diff, CMP, AFP).  I discussed the assessment and treatment plan with the patient.  The patient was provided an opportunity to ask questions and all were answered.  The patient agreed with the plan and demonstrated an understanding of the instructions.  The patient was advised to call back if the symptoms worsen or if the condition fails to improve as anticipated.   Lequita Asal, MD, PhD    06/24/2019, 9:49 AM  I, Selena Batten, am acting as scribe for [employer]. Mike Gip, MD, PhD.  I, Berry Gallacher C. Mike Gip, MD, have reviewed the above documentation for accuracy and completeness, and I agree with the above.

## 2019-06-23 ENCOUNTER — Other Ambulatory Visit: Payer: Self-pay | Admitting: Hematology and Oncology

## 2019-06-23 ENCOUNTER — Other Ambulatory Visit: Payer: Self-pay

## 2019-06-23 DIAGNOSIS — C220 Liver cell carcinoma: Secondary | ICD-10-CM

## 2019-06-23 MED ORDER — LENVIMA (8 MG DAILY DOSE) 2 X 4 MG PO CPPK: 8.0000 mg | ORAL_CAPSULE | Freq: Every day | ORAL | 0 refills | Status: DC

## 2019-06-23 NOTE — Telephone Encounter (Signed)
CBC with Differential Order: [id number] Status:  Final result  Visible to patient:  No (inaccessible in [place])  Next appt:  06/24/2019 at 08:45 AM in Oncology ([facility])  Dx:  Hypokalemia; Hepatocellular carcinoma...  Ref Range & Units 9 d ago  WBC 4.0 - 10.5 K/uL 4.8   RBC 4.22 - 5.81 MIL/uL 5.08   Hemoglobin 13.0 - 17.0 g/dL 16.1   HCT 39.0 - 52.0 % 46.7   MCV 80.0 - 100.0 fL 91.9   MCH 26.0 - 34.0 pg 31.7   MCHC 30.0 - 36.0 g/dL 34.5   RDW 11.5 - 15.5 % 14.2   Platelets 150 - 400 K/uL 125Low    nRBC 0.0 - 0.2 % 0.0   Neutrophils Relative % % 57   Neutro Abs 1.7 - 7.7 K/uL 2.7   Lymphocytes Relative % 33   Lymphs Abs 0.7 - 4.0 K/uL 1.6   Monocytes Relative % 6   Monocytes Absolute 0.1 - 1.0 K/uL 0.3   Eosinophils Relative % 3   Eosinophils Absolute 0.0 - 0.5 K/uL 0.2   Basophils Relative % 1   Basophils Absolute 0.0 - 0.1 K/uL 0.0   Immature Granulocytes % 0   Abs Immature Granulocytes 0.00 - 0.07 K/uL 0.01   Comment: Performed at [facility], [address] [place], [place] [address]  Resulting Agency  [facility]      Specimen Collected: 06/14/19 09:29  Last Resulted: 06/14/19 09:43     Lab Flowsheet   Order Details   View Encounter   Lab and Collection Details   Routing   Result History       Result Communications  Follow-ups   06/14/2019 Telephone      Other Results from 06/14/2019  Contains abnormal data Comprehensive metabolic panel  Status:  Final result  Visible to patient:  No (inaccessible in MyChart)  Next appt:  06/24/2019 at 08:45 AM in Oncology ([facility])  Dx:  Hypokalemia; Hepatocellular carcinoma... Order: [id number]   (important suggestion)  Newer results are available. Click to view them now.   Ref Range & Units 9 d ago  Sodium 135 - 145 mmol/L 133Low    Potassium 3.5 - 5.1 mmol/L 3.0Low    Chloride 98 - 111 mmol/L 97Low    CO2 22 - 32 mmol/L 27   Glucose, Bld 70 - 99 mg/dL 89   BUN 8 - 23 mg/dL 17    Creatinine, Ser 0.61 - 1.24 mg/dL 0.82   Calcium 8.9 - 10.3 mg/dL 9.3   Total Protein 6.5 - 8.1 g/dL 8.2High    Albumin 3.5 - 5.0 g/dL 4.5   AST 15 - 41 U/L 51High    ALT 0 - 44 U/L 41   Alkaline Phosphatase 38 - 126 U/L 130High    Total Bilirubin 0.3 - 1.2 mg/dL 0.8   GFR calc non Af Amer >60 mL/min >60   GFR calc Af Amer >60 mL/min >60   Anion gap 5 - 15 9   Comment: Performed at [facility], [address], [place],  [address]  Resulting Agency  [facility]      Specimen Collected: 06/14/19 09:29  Last Resulted: 06/14/19 09:54     Lab Flowsheet   Order Details   View Encounter   Lab and Collection Details   Routing   Result History       Result Communications  Follow-ups   06/14/2019 Telephone  Magnesium  Status:  Final result  Visible to patient:  No (inaccessible in MyChart)  Next appt:  06/24/2019 at 08:45 AM in Oncology ([facility])  Dx:  Hypokalemia; Hepatocellular carcinoma... Order: [id number]  Ref Range & Units 9 d ago  Magnesium 1.7 - 2.4 mg/dL 2.0   Comment: Performed at [facility], [address], [place], [place] [address]  Resulting Agency  [facility]      Specimen Collected: 06/14/19 09:29  Last Resulted: 06/14/19 09:54         Basic metabolic panel Order: [id number] Status:  Final result  Visible to patient:  No (inaccessible in MyChart)  Next appt:  06/24/2019 at 08:45 AM in Oncology ([facility])  Dx:  Hypokalemia  Ref Range & Units 6 d ago  Sodium 135 - 145 mmol/L 134Low    Potassium 3.5 - 5.1 mmol/L 3.2Low    Chloride 98 - 111 mmol/L 98   CO2 22 - 32 mmol/L 25   Glucose, Bld 70 - 99 mg/dL 107High    Comment: Glucose reference range applies only to samples taken after fasting for at least 8 hours.  BUN 8 - 23 mg/dL 15   Creatinine, Ser 0.61 - 1.24 mg/dL 0.70   Calcium 8.9 - 10.3 mg/dL 9.2   GFR calc non Af Amer >60 mL/min >60   GFR calc Af Amer >60 mL/min >60   Anion gap 5 -  15 11   Comment: Performed at [facility], [address] [place] [address], [place], [place] [address]  Resulting Agency  [facility]      Specimen Collected: 06/17/19 10:25  Last Resulted: 06/17/19 11:19     Lab Flowsheet   Order Details   View Encounter   Lab and Collection Details   Routing   Result History         Other Results from 06/17/2019  Lipase, blood  Status:  Final result  Visible to patient:  No (inaccessible in MyChart)  Next appt:  06/24/2019 at 08:45 AM in Oncology ([facility])  Dx:  Epigastric pain Order: [id number]  Ref Range & Units 6 d ago  Lipase 11 - 51 U/L 28   Comment: Performed at [facility], [address], [place], [place] [address]  Resulting Agency  [facility]      Specimen Collected: 06/17/19 10:25  Last Resulted: 06/17/19 11:19     Lab Flowsheet   Order Details   View Encounter   Lab and Collection Details   Routing   Result History           Amylase  Status:  Final result  Visible to patient:  No (inaccessible in MyChart)  Next appt:  06/24/2019 at 08:45 AM in Oncology ([facility])  Dx:  Epigastric pain Order: [id number]  Ref Range & Units 6 d ago  Amylase 28 - 100 U/L 46   Comment: Performed at [facility], [address] [place], [place] [address]  Resulting Agency  [facility] LAB      Specimen Collected: 06/17/19 10:25  Last Resulted: 06/17/19 11:19

## 2019-06-24 ENCOUNTER — Encounter: Payer: Self-pay | Admitting: Hematology and Oncology

## 2019-06-24 ENCOUNTER — Inpatient Hospital Stay (HOSPITAL_BASED_OUTPATIENT_CLINIC_OR_DEPARTMENT_OTHER): Payer: Managed Care, Other (non HMO) | Admitting: Hematology and Oncology

## 2019-06-24 ENCOUNTER — Other Ambulatory Visit: Payer: Self-pay

## 2019-06-24 ENCOUNTER — Inpatient Hospital Stay: Payer: Managed Care, Other (non HMO)

## 2019-06-24 VITALS — BP 129/86 | HR 64 | Temp 97.8°F | Resp 18 | Wt 116.4 lb

## 2019-06-24 DIAGNOSIS — C220 Liver cell carcinoma: Secondary | ICD-10-CM | POA: Diagnosis present

## 2019-06-24 DIAGNOSIS — Z7189 Other specified counseling: Secondary | ICD-10-CM

## 2019-06-24 DIAGNOSIS — Z7982 Long term (current) use of aspirin: Secondary | ICD-10-CM | POA: Diagnosis not present

## 2019-06-24 DIAGNOSIS — R634 Abnormal weight loss: Secondary | ICD-10-CM

## 2019-06-24 DIAGNOSIS — E876 Hypokalemia: Secondary | ICD-10-CM | POA: Diagnosis not present

## 2019-06-24 DIAGNOSIS — Z79899 Other long term (current) drug therapy: Secondary | ICD-10-CM | POA: Diagnosis not present

## 2019-06-24 DIAGNOSIS — Z8673 Personal history of transient ischemic attack (TIA), and cerebral infarction without residual deficits: Secondary | ICD-10-CM | POA: Diagnosis not present

## 2019-06-24 DIAGNOSIS — E860 Dehydration: Secondary | ICD-10-CM | POA: Diagnosis not present

## 2019-06-24 DIAGNOSIS — F17210 Nicotine dependence, cigarettes, uncomplicated: Secondary | ICD-10-CM | POA: Diagnosis not present

## 2019-06-24 DIAGNOSIS — I10 Essential (primary) hypertension: Secondary | ICD-10-CM | POA: Diagnosis not present

## 2019-06-24 LAB — COMPREHENSIVE METABOLIC PANEL
ALT: 53 U/L — ABNORMAL HIGH (ref 0–44)
AST: 57 U/L — ABNORMAL HIGH (ref 15–41)
Albumin: 4.2 g/dL (ref 3.5–5.0)
Alkaline Phosphatase: 136 U/L — ABNORMAL HIGH (ref 38–126)
Anion gap: 16 — ABNORMAL HIGH (ref 5–15)
BUN: 17 mg/dL (ref 8–23)
CO2: 24 mmol/L (ref 22–32)
Calcium: 9.6 mg/dL (ref 8.9–10.3)
Chloride: 97 mmol/L — ABNORMAL LOW (ref 98–111)
Creatinine, Ser: 0.78 mg/dL (ref 0.61–1.24)
GFR calc Af Amer: >60 mL/min (ref >60)
GFR calc non Af Amer: >60 mL/min (ref >60)
Glucose, Bld: 98 mg/dL (ref 70–99)
Potassium: 3.4 mmol/L — ABNORMAL LOW (ref 3.5–5.1)
Sodium: 137 mmol/L (ref 135–145)
Total Bilirubin: 0.7 mg/dL (ref 0.3–1.2)
Total Protein: 7.7 g/dL (ref 6.5–8.1)

## 2019-06-24 NOTE — Progress Notes (Signed)
Pt here for follow up. Reports he has an infection around umbilicus and has not had it checked out by PCP yet. Denies any other concerns.

## 2019-06-28 ENCOUNTER — Telehealth: Payer: Self-pay

## 2019-06-28 NOTE — Telephone Encounter (Signed)
Spoke with the patient to see how is things going with his belly button / infection. The patient reports he is fine and don't need to be seen in the office. He states no drainage or blood noted. He is fine. I have informed the patient if he need anything give the office a call. The patient was understanding and agreeable.

## 2019-07-01 ENCOUNTER — Other Ambulatory Visit: Payer: Self-pay

## 2019-07-01 ENCOUNTER — Inpatient Hospital Stay: Payer: Managed Care, Other (non HMO) | Admitting: Oncology

## 2019-07-01 DIAGNOSIS — C220 Liver cell carcinoma: Secondary | ICD-10-CM

## 2019-07-01 LAB — COMPREHENSIVE METABOLIC PANEL
ALT: 58 U/L — ABNORMAL HIGH (ref 0–44)
AST: 60 U/L — ABNORMAL HIGH (ref 15–41)
Albumin: 4.6 g/dL (ref 3.5–5.0)
Alkaline Phosphatase: 156 U/L — ABNORMAL HIGH (ref 38–126)
Anion gap: 16 — ABNORMAL HIGH (ref 5–15)
BUN: 20 mg/dL (ref 8–23)
CO2: 20 mmol/L — ABNORMAL LOW (ref 22–32)
Calcium: 9.3 mg/dL (ref 8.9–10.3)
Chloride: 96 mmol/L — ABNORMAL LOW (ref 98–111)
Creatinine, Ser: 0.84 mg/dL (ref 0.61–1.24)
GFR calc Af Amer: >60 mL/min (ref >60)
GFR calc non Af Amer: >60 mL/min (ref >60)
Glucose, Bld: 91 mg/dL (ref 70–99)
Potassium: 3.1 mmol/L — ABNORMAL LOW (ref 3.5–5.1)
Sodium: 132 mmol/L — ABNORMAL LOW (ref 135–145)
Total Bilirubin: 1.2 mg/dL (ref 0.3–1.2)
Total Protein: 8.3 g/dL — ABNORMAL HIGH (ref 6.5–8.1)

## 2019-07-02 ENCOUNTER — Telehealth: Payer: Self-pay | Admitting: Nurse Practitioner

## 2019-07-02 NOTE — Progress Notes (Addendum)
Do you know if this patient is taking his potassium tablets? He needs to be taking them.   Faythe Casa, NP 07/02/2019 12:18 PM

## 2019-07-02 NOTE — Telephone Encounter (Signed)
Called to f/u regarding potassium. Patient did not answer. Unable to leave message.

## 2019-07-05 ENCOUNTER — Telehealth: Payer: Self-pay

## 2019-07-05 ENCOUNTER — Other Ambulatory Visit: Payer: Self-pay

## 2019-07-05 ENCOUNTER — Other Ambulatory Visit: Payer: Self-pay | Admitting: Hematology and Oncology

## 2019-07-05 DIAGNOSIS — E876 Hypokalemia: Secondary | ICD-10-CM

## 2019-07-05 DIAGNOSIS — R112 Nausea with vomiting, unspecified: Secondary | ICD-10-CM

## 2019-07-05 MED ORDER — ONDANSETRON HCL 8 MG PO TABS: 8.0000 mg | ORAL_TABLET | Freq: Three times a day (TID) | ORAL | 0 refills | Status: DC | PRN

## 2019-07-05 MED ORDER — POTASSIUM CHLORIDE CRYS ER 20 MEQ PO TBCR: EXTENDED_RELEASE_TABLET | ORAL | 0 refills | Status: DC

## 2019-07-05 NOTE — Telephone Encounter (Signed)
Spoke with the patient to see if he is currently taking potassium the patient states yes he is currently taking potassium 20 meq daily. I have informed the patient if Dr Mike Gip make changes I will give him a call back.

## 2019-07-05 NOTE — Telephone Encounter (Signed)
Spoke with the patient to inform him Per Dr Mike Gip take 1 tab po BID for 2 -3 days and recheck labs this week ( Thursday) The patient was understanding aand agreeable.

## 2019-07-05 NOTE — Progress Notes (Signed)
  These are not Rxs that I can sign.  M

## 2019-07-06 ENCOUNTER — Other Ambulatory Visit: Payer: Self-pay

## 2019-07-06 DIAGNOSIS — C220 Liver cell carcinoma: Secondary | ICD-10-CM

## 2019-07-06 MED ORDER — LENVIMA (8 MG DAILY DOSE) 2 X 4 MG PO CPPK: 4.0000 mg | ORAL_CAPSULE | Freq: Every day | ORAL | 0 refills | Status: DC

## 2019-07-06 NOTE — Telephone Encounter (Signed)
)    Ref Range & Units 5 d ago  Potassium 3.5 - 5.1 mmol/L 3.1Low

## 2019-07-08 ENCOUNTER — Other Ambulatory Visit: Payer: Self-pay

## 2019-07-08 ENCOUNTER — Telehealth: Payer: Self-pay

## 2019-07-08 ENCOUNTER — Inpatient Hospital Stay: Payer: Managed Care, Other (non HMO)

## 2019-07-08 DIAGNOSIS — C220 Liver cell carcinoma: Secondary | ICD-10-CM

## 2019-07-08 LAB — COMPREHENSIVE METABOLIC PANEL
ALT: 43 U/L (ref 0–44)
AST: 52 U/L — ABNORMAL HIGH (ref 15–41)
Albumin: 4.3 g/dL (ref 3.5–5.0)
Alkaline Phosphatase: 161 U/L — ABNORMAL HIGH (ref 38–126)
Anion gap: 8 (ref 5–15)
BUN: 12 mg/dL (ref 8–23)
CO2: 26 mmol/L (ref 22–32)
Calcium: 9.1 mg/dL (ref 8.9–10.3)
Chloride: 98 mmol/L (ref 98–111)
Creatinine, Ser: 0.74 mg/dL (ref 0.61–1.24)
GFR calc Af Amer: >60 mL/min (ref >60)
GFR calc non Af Amer: >60 mL/min (ref >60)
Glucose, Bld: 93 mg/dL (ref 70–99)
Potassium: 3 mmol/L — ABNORMAL LOW (ref 3.5–5.1)
Sodium: 132 mmol/L — ABNORMAL LOW (ref 135–145)
Total Bilirubin: 0.8 mg/dL (ref 0.3–1.2)
Total Protein: 7.8 g/dL (ref 6.5–8.1)

## 2019-07-08 LAB — CBC WITH DIFFERENTIAL/PLATELET
Abs Immature Granulocytes: 0.02 10*3/uL (ref 0.00–0.07)
Basophils Absolute: 0 10*3/uL (ref 0.0–0.1)
Basophils Relative: 1 %
Eosinophils Absolute: 0.3 10*3/uL (ref 0.0–0.5)
Eosinophils Relative: 5 %
HCT: 44.7 % (ref 39.0–52.0)
Hemoglobin: 15.1 g/dL (ref 13.0–17.0)
Immature Granulocytes: 0 %
Lymphocytes Relative: 27 %
Lymphs Abs: 1.5 10*3/uL (ref 0.7–4.0)
MCH: 31.9 pg (ref 26.0–34.0)
MCHC: 33.8 g/dL (ref 30.0–36.0)
MCV: 94.3 fL (ref 80.0–100.0)
Monocytes Absolute: 0.3 10*3/uL (ref 0.1–1.0)
Monocytes Relative: 5 %
Neutro Abs: 3.5 10*3/uL (ref 1.7–7.7)
Neutrophils Relative %: 62 %
Platelets: 129 10*3/uL — ABNORMAL LOW (ref 150–400)
RBC: 4.74 MIL/uL (ref 4.22–5.81)
RDW: 13.5 % (ref 11.5–15.5)
WBC: 5.6 10*3/uL (ref 4.0–10.5)
nRBC: 0 % (ref 0.0–0.2)

## 2019-07-08 NOTE — Telephone Encounter (Signed)
-----   Message from Lequita Asal, MD sent at 07/08/2019  2:30 PM EDT ----- Regarding: Please call patient  How much potassium is he taking?  Did he miss some doses?  I though he was going to increase his dose for 2-3 days at last check.  M ----- Message ----- From: Buel Ream, Lab In [place] Sent: 07/08/2019   8:47 AM EDT To: Lequita Asal, MD

## 2019-07-08 NOTE — Telephone Encounter (Signed)
Spoke with the patient Per Dr Mike Gip to see if he increased his Potassium to BID, the patient reports yes he did increase his poatssium x 2 days and he will take 2 pills again tomorrow. I have informed the patient that his levels are still a little low today.The patient was understanding and agreeable.

## 2019-07-09 ENCOUNTER — Telehealth: Payer: Self-pay | Admitting: *Deleted

## 2019-07-09 LAB — AFP TUMOR MARKER: AFP, Serum, Tumor Marker: 642 ng/mL — ABNORMAL HIGH (ref 0.0–8.3)

## 2019-07-09 NOTE — Telephone Encounter (Signed)
Telephone call to patient. He states he has been taking potassium pill twice a day last 2-3 days. I instructed him to take it twice a day today, Sat and Sunday. Patient states he will do that.

## 2019-07-12 ENCOUNTER — Telehealth: Payer: Self-pay

## 2019-07-12 ENCOUNTER — Other Ambulatory Visit: Payer: Self-pay | Admitting: Hematology and Oncology

## 2019-07-12 ENCOUNTER — Other Ambulatory Visit: Payer: Self-pay

## 2019-07-12 DIAGNOSIS — C220 Liver cell carcinoma: Secondary | ICD-10-CM

## 2019-07-12 DIAGNOSIS — R1013 Epigastric pain: Secondary | ICD-10-CM

## 2019-07-12 NOTE — Telephone Encounter (Signed)
Spoke with the pharmacies to inform them that the patient Lenvatinib 8 mg daily has been decreased to 4 mg daily. They has question about the change in medication, Per Dr Mike Gip noted the patient has improved with the medication and she has decreased the does on 06/24/2019 to Lenvatinib 4 mg daily.

## 2019-07-13 NOTE — Progress Notes (Signed)
[facility]  [address] [place], [place] [address] Phone: [phone]  Fax: [phone]   Clinic Day:  07/15/2019  Referring physician: Donnie Coffin, MD  Chief Complaint: Chris Mcdonald is a 64 y.o. male with stage IIIBhepatocellular carcinomaon lenvatinib who is seen for a 3 week assessment.   HPI: The patient was last seen in the medical oncology clinic on 06/24/2019. At that time, his nausea and vomiting had resolved. He had gained weight. He had mild RUQ and epigastric discomfort. Potassium was 3.4.  AST was 57, ALT 53, and alkaline phosphatase 136. Patient continued oral potassium 20 meq a day.   Patient was contacted by the clinic on 06/28/2019 regarding his belly button/infection. He reported that he was fine. He had no drainage or blood. He did not think he needed to be seen in the office for evaluation.   Patient was directed to take oral potassium 20 meq BID x 2-3 days on 07/05/2019.  Potassium was 3.1 on 07/01/2019.  Labs on 07/08/2019 revealed a hematocrit 44.7, hemoglobin 15.1, platelets 129,000, WBC 5,600. Sodium was 132, potassium 3.0, AST 52, ALT 43, alkaline phosphatase 161. AFP was 642.0.   During the interim, the patient has felt "pretty good". He is actively trying to gain weight. He notes presistent abdominal discomfort when eating. Pain prevents him from eating sometimes. He is down 4 pounds today.  He notes that he is eating. Yesterday he ate sausage and gravy biscuit, beanie weenies and crackers. For lunch yesterday, he had hamburgers. He is eating foods that he can keep down.  He is yet to associate foods that induce vomiting. He drinking boost BID and he would like more samples today.   As he begins to eat, his stomach begins to hurt.  He vomits 1-3 times a week. He is unsure why is throws up. He is taking an anti-emetic 20 minutes before eating. I advised him to take his medication 30 minutes to an hour before eating.  He  feels comfortable with his current dose of lenvatinib; he states that the bigger dose was too strong for him.   He reports taking oral potassium with his Boost. He increased his oral potassium x 3 days as prescribed since last visit. His potassium is 3.1 today. Patient agreed to oral potassium 20 meq BID.    Past Medical History:  Diagnosis Date  . Arthritis   . Hepatitis   . Hypertension   . Spinal stenosis   . Stroke [facility])     Past Surgical History:  Procedure Laterality Date  . COLONOSCOPY WITH PROPOFOL N/A 02/21/2015   Procedure: COLONOSCOPY WITH PROPOFOL;  Surgeon: Lollie Sails, MD;  Location: [facility] ENDOSCOPY;  Service: Endoscopy;  Laterality: N/A;  . INGUINAL HERNIA REPAIR Bilateral   . IR RADIOLOGIST EVAL & MGMT  08/04/2018  . POSTERIOR CERVICAL VERTEBRAE EXCISION      Family History  Problem Relation Age of Onset  . Cancer Brother   . Heart disease Brother     Social History:  reports that he has been smoking cigarettes. He has a 75.00 pack-year smoking history. He has never used smokeless tobacco. He reports that he does not drink alcohol or use drugs. Patient lives in [place] with his wife and daughter.He has a 20 year old daughter, 15 year old son, and 110 year old son. The patient is alone today.  Allergies: No Known Allergies  Current Medications: Current Outpatient Medications  Medication Sig Dispense Refill  .  amLODipine (NORVASC) 5 MG tablet Take 10 mg by mouth daily.     Marland Kitchen aspirin EC 81 MG tablet Take 81 mg by mouth daily.     . fluticasone (FLONASE) 50 MCG/ACT nasal spray 1 spray by Each Nare route daily.    . hydrochlorothiazide (HYDRODIURIL) 12.5 MG tablet Take 25 mg by mouth daily.     Marland Kitchen lenvatinib 8 mg daily dose (LENVIMA, 8 MG DAILY DOSE,) 2 x 4 MG capsule Take 1 capsule (4 mg total) by mouth daily. 30 capsule 0  . omeprazole (PRILOSEC) 20 MG capsule TAKE 1 CAPSULE BY MOUTH EVERY DAY 30 capsule 1  . ondansetron (ZOFRAN) 8 MG tablet Take 1 tablet (8 mg  total) by mouth every 8 (eight) hours as needed for nausea or vomiting. 20 tablet 0  . potassium chloride SA (KLOR-CON M20) 20 MEQ tablet TAKE 1 TABLET (20 MEQ TOTAL) BY MOUTH DAILY. AS DIRECTED BY MD 40 tablet 0   No current facility-administered medications for this visit.    Review of Systems  Constitutional: Positive for weight loss (4 lbs). Negative for chills, fever and malaise/fatigue.       Feels "pretty good".  HENT: Negative.  Negative for congestion, ear pain, hearing loss, nosebleeds, sinus pain, sore throat and tinnitus.   Eyes: Negative.  Negative for blurred vision and double vision.  Respiratory: Negative.  Negative for cough, hemoptysis, sputum production and shortness of breath.   Cardiovascular: Negative.  Negative for chest pain, palpitations and leg swelling.  Gastrointestinal: Positive for abdominal pain (RUQ and epigastric area; increases when eating), nausea (controlled on medication) and vomiting (x 1-3 a week). Negative for blood in stool, constipation, diarrhea, heartburn and melena.       Drinking Boost x 2 per day. Eating well at times.  Genitourinary: Negative.  Negative for dysuria, frequency and urgency.  Musculoskeletal: Negative.  Negative for back pain, falls, myalgias and neck pain.  Skin: Negative.  Negative for rash.  Neurological: Negative.  Negative for tingling, tremors, sensory change, focal weakness, weakness and headaches.  Endo/Heme/Allergies: Negative.  Does not bruise/bleed easily.  Psychiatric/Behavioral: Negative.  Negative for depression. The patient is not nervous/anxious and does not have insomnia.   All other systems reviewed and are negative.  Performance status (ECOG): 1  Vitals Blood pressure 120/86, pulse 70, temperature 98 F (36.7 C), temperature source Oral, resp. rate 20, weight 112 lb 12.2 oz (51.1 kg), SpO2 100 %.   Physical Exam  Constitutional: He is oriented to person, place, and time.  Thin gentleman sitting comfortably  in the exam room in no acute distress.  HENT:  Head: Normocephalic and atraumatic.  Mouth/Throat: Oropharynx is clear and moist. No oropharyngeal exudate.  Short brown hair.  Edentulous.  Temporal wasting.  Mask.  Eyes: Pupils are equal, round, and reactive to light. Conjunctivae and EOM are normal. No scleral icterus.  Blue eyes.  Neck: No JVD present.  Cardiovascular: Normal rate, regular rhythm and normal heart sounds. Exam reveals no gallop.  No murmur heard. Pulmonary/Chest: Effort normal and breath sounds normal. No respiratory distress. He has no wheezes. He has no rales.  Abdominal: Soft. Bowel sounds are normal. He exhibits no distension and no mass. There is no splenomegaly or hepatomegaly. There is no abdominal tenderness. There is no rebound and no guarding.  Musculoskeletal:        General: No tenderness or edema. Normal range of motion.     Cervical back: Normal range of motion and neck  supple.  Lymphadenopathy:       Head (right side): No preauricular, no posterior auricular and no occipital adenopathy present.       Head (left side): No preauricular, no posterior auricular and no occipital adenopathy present.    He has no cervical adenopathy.    He has no axillary adenopathy.       Right: No inguinal and no supraclavicular adenopathy present.       Left: No inguinal and no supraclavicular adenopathy present.  Neurological: He is alert and oriented to person, place, and time.  Skin: Skin is warm and dry. No rash noted. He is not diaphoretic. No erythema. No pallor.  Psychiatric: He has a normal mood and affect. His behavior is normal. Judgment and thought content normal.  Nursing note and vitals reviewed.   Imaging studies: 06/19/2019:Low dose chest CTrevealed Lung-RADS 4A-S, suspicious. There was a new centrilobular nodularity in both lungs, more likely inflammatory. Three was a 8.5 x 5.7 cm poorly marginated geographic region of low attenuation in the superior right  liver, new since 06/17/2017.  09/16/2018:Chest CTrevealed aLung-RADSscore of2, benign appearance or behavior. There was an ill-defined liver mass felt to represent a hepatoma on 07/10/2018, in the setting of cirrhosis. 07/10/2018:Abdomen MRI revealed an 8.8 cm heterogeneous infiltrating segment 8. Findings were consistent with a hepatoma, given the underlying cirrhotic changes involving the liver. This was also invading and obstructing the middle and right portal veins and the middle hepatic vein. There were scattered abdominal lymph nodes but no definite findings for metastatic disease. 11/28/2018:Abdominal MRI revealedresponse to therapy of segment 8 hepatocellular carcinoma with persistent tumor thrombus extension into the right portal vein. There was no evidence of new liver lesions or abdominal metastasis. 03/15/2019:Abdominal MRIrevealed no significant change in poorly defined mass in segment 8 of the right lobe, with persistent tumor thrombus throughout the right portal vein. There was no new liver lesions or abdominal metastatic disease. 06/15/2019:Abdominal MRIrevealedslight increase in peripheral biliary ductal dilation surrounding the infiltrative central hepatic neoplasm. Portal vein involvement may be slightly improved compared to the prior study. There was no significant change in size or new lesion.There was suggestion of subcarinal adenopathy though assessment of the chest on MRIwas limited. Consider chest CT for further evaluation. There was question of colonic thickening,whichcould herald mild colitis. Correlate clinically.   Appointment on 07/15/2019  Component Date Value Ref Range Status  . Sodium 07/15/2019 133* 135 - 145 mmol/L Final  . Potassium 07/15/2019 3.1* 3.5 - 5.1 mmol/L Final  . Chloride 07/15/2019 101  98 - 111 mmol/L Final  . CO2 07/15/2019 24  22 - 32 mmol/L Final  . Glucose, Bld 07/15/2019 93  70 - 99 mg/dL Final   Glucose reference  range applies only to samples taken after fasting for at least 8 hours.  . BUN 07/15/2019 13  8 - 23 mg/dL Final  . Creatinine, Ser 07/15/2019 0.71  0.61 - 1.24 mg/dL Final  . Calcium 07/15/2019 9.0  8.9 - 10.3 mg/dL Final  . Total Protein 07/15/2019 7.2  6.5 - 8.1 g/dL Final  . Albumin 07/15/2019 4.1  3.5 - 5.0 g/dL Final  . AST 07/15/2019 52* 15 - 41 U/L Final  . ALT 07/15/2019 48* 0 - 44 U/L Final  . Alkaline Phosphatase 07/15/2019 148* 38 - 126 U/L Final  . Total Bilirubin 07/15/2019 0.5  0.3 - 1.2 mg/dL Final  . GFR calc non Af Amer 07/15/2019 >60  >60 mL/min Final  . GFR calc Af  Amer 07/15/2019 >60  >60 mL/min Final  . Anion gap 07/15/2019 8  5 - 15 Final   Performed at [facility], [address] [place], [place] [address]  . WBC 07/15/2019 5.5  4.0 - 10.5 K/uL Final  . RBC 07/15/2019 4.74  4.22 - 5.81 MIL/uL Final  . Hemoglobin 07/15/2019 14.8  13.0 - 17.0 g/dL Final  . HCT 07/15/2019 45.2  39.0 - 52.0 % Final  . MCV 07/15/2019 95.4  80.0 - 100.0 fL Final  . MCH 07/15/2019 31.2  26.0 - 34.0 pg Final  . MCHC 07/15/2019 32.7  30.0 - 36.0 g/dL Final  . RDW 07/15/2019 13.2  11.5 - 15.5 % Final  . Platelets 07/15/2019 146* 150 - 400 K/uL Final  . nRBC 07/15/2019 0.0  0.0 - 0.2 % Final  . Neutrophils Relative % 07/15/2019 63  % Final  . Neutro Abs 07/15/2019 3.5  1.7 - 7.7 K/uL Final  . Lymphocytes Relative 07/15/2019 25  % Final  . Lymphs Abs 07/15/2019 1.4  0.7 - 4.0 K/uL Final  . Monocytes Relative 07/15/2019 6  % Final  . Monocytes Absolute 07/15/2019 0.3  0.1 - 1.0 K/uL Final  . Eosinophils Relative 07/15/2019 5  % Final  . Eosinophils Absolute 07/15/2019 0.3  0.0 - 0.5 K/uL Final  . Basophils Relative 07/15/2019 1  % Final  . Basophils Absolute 07/15/2019 0.0  0.0 - 0.1 K/uL Final  . Immature Granulocytes 07/15/2019 0  % Final  . Abs Immature Granulocytes 07/15/2019 0.01  0.00 - 0.07 K/uL Final   Performed at [facility], [address] [place], [place] [address]    Assessment:  Chris Mcdonald is a 64 y.o. male with unresectable hepatocellular carcinoma. He has a history of hepatitis C(treated with Harvoni in 04/11/2015). He has hepatitis B. Mass was discovered incidentally on low dose chest CT screenig program. AFPwas 7329. Child-Pugh ClassisA (score 5).  Low dose chest CTon 06/18/2018 revealed Lung-RADS 4A-S, suspicious. There was a new centrilobular nodularity in both lungs, more likely inflammatory. Three was a 8.5 x 5.7 cm poorly marginated geographic region of low attenuation in the superior right liver, new since 06/17/2017. Chest CTon 09/16/2018 revealed aLung-RADSscore of2, benign appearance or behavior. There was an ill-defined liver mass felt to represent a hepatoma on 07/10/2018, in the setting of cirrhosis.  Abdomen MRI on 07/10/2018 revealed an 8.8 cm heterogeneous infiltrating segment 8. Findings were consistent with a hepatoma, given the underlying cirrhotic changes involving the liver. This was also invading and obstructing the middle and right portal veins and the middle hepatic vein. There were scattered abdominal lymph nodes but no definite findings for metastatic disease.  AFPhas been followed: [id number] on 07/27/2018,6250 on 08/17/2018, 467 on 11/18/2018, 271 on 12/29/2018, 2320 on 02/08/2019, 1548 on 03/08/2019, 721 on 04/07/2019, 500 on 05/05/2019, 501.0 on 06/14/2019, 642 on 07/08/2019, and 603 on 07/15/2019.  He began lenvatinibon 08/25/2018.Hewasoff lenvatinibfrom09/09/2018 - 02/14/2019.He has continued lenvatinib.  Lenvatinib was decreased to 4 mg a day on 06/17/2019.  Abdominal MRI on 06/15/2019 showedslight increase in peripheral biliary ductal dilation surrounding the infiltrative central hepatic neoplasm. Portal vein involvement may be slightly improved compared to the prior study. There was no significant change in size or new lesion.There was suggestion of subcarinal  adenopathy.There was question of colonic thickening,whichcould herald mild colitis.  He has a history of hypercalcemia. Calcium was 11.0 on 08/17/2018. PTH and PTH-rp were low on 08/19/2018.Calcium was 9.3 on 11/30/2018.  EKG on 06/17/2019 revealed no increased QTc.  Symptomatically, he feels "pretty good".  He eats well at times.  He notes intermittent abdominal pain on eating.  He has 1-2 episodes of emesis/week.  Exam is stable.  Plan: 1.   Labs today: CBC with diff, CMP, AFP.  2. Hepatocellular carcinoma Clinically,he is doing fairly well on lenvatinib 4 mg a day.  He was intolerant to 8 mg/day.  Symptomatically, he notes intermittent abdominal pain with eating.  He has 1-2 episodes of emesis/week.  Discuss small frequent meals, Boost, and use of anti-emetics. AFPinitiallyimproved from [id number] to 721 to 500then stablizedover past47months.   AFP is 603 today. Abdominal MRIon 06/15/2019 revealed stable liver lesion                          Next imaging in 3 months unless increasing AFP. Discuss symptom management.  He has antiemetics at home to use on a prn bases.  Interventions are adequate.    3. Abdominal discomfort He has intermittent RUQ (liver) and epigastricpain.                         Symptoms have improved with decreased ose of lenvatinib. Abdominal pain incidence approximately 30% with lenvatinib. Amylase and lipase were normal previously. Continue omeprazole. 4. Weight loss Weight down 4 pounds since last visit.  Encourage small frequent meals.  Encourage taking antiemetics 30 minutes prior to meals. Samples of Ensure/Boost today. 5.Hypokalemia  Potassium 3.1.  Increase potassium from 20 meq/day to 20 meq BID. 6.   Hypertension Blood pressure120/86. He has baseline hypertension.                           He is onHCTZ 25 mg a day and amlodipine 10 mg a day. Lenvatinibisexacerbated by lenvatinib. Continue to monitor. 7.   RTC in 10 days for labs (BMP). 8.   RTC in 4 weeks for MD assessment and labs (CBC with diff, CMP, AFP).  I discussed the assessment and treatment plan with the patient.  The patient was provided an opportunity to ask questions and all were answered.  The patient agreed with the plan and demonstrated an understanding of the instructions.  The patient was advised to call back if the symptoms worsen or if the condition fails to improve as anticipated.  I provided 20 minutes of face-to-face time during this this encounter and > 50% was spent counseling as documented under my assessment and plan.    Lequita Asal, MD, PhD    07/15/2019, 10:42 AM  I, Selena Batten, am acting as scribe for [employer]. Mike Gip, MD, PhD.  I, Aquarius Latouche C. Mike Gip, MD, have reviewed the above documentation for accuracy and completeness, and I agree with the above.

## 2019-07-14 ENCOUNTER — Other Ambulatory Visit: Payer: Self-pay

## 2019-07-14 ENCOUNTER — Encounter: Payer: Self-pay | Admitting: Hematology and Oncology

## 2019-07-15 ENCOUNTER — Inpatient Hospital Stay (HOSPITAL_BASED_OUTPATIENT_CLINIC_OR_DEPARTMENT_OTHER): Payer: Managed Care, Other (non HMO) | Admitting: Hematology and Oncology

## 2019-07-15 ENCOUNTER — Inpatient Hospital Stay: Payer: Managed Care, Other (non HMO)

## 2019-07-15 ENCOUNTER — Encounter: Payer: Self-pay | Admitting: Hematology and Oncology

## 2019-07-15 VITALS — BP 120/86 | HR 70 | Temp 98.0°F | Resp 20 | Wt 112.8 lb

## 2019-07-15 DIAGNOSIS — E876 Hypokalemia: Secondary | ICD-10-CM | POA: Diagnosis not present

## 2019-07-15 DIAGNOSIS — I639 Cerebral infarction, unspecified: Secondary | ICD-10-CM

## 2019-07-15 DIAGNOSIS — R109 Unspecified abdominal pain: Secondary | ICD-10-CM | POA: Diagnosis not present

## 2019-07-15 DIAGNOSIS — Z7189 Other specified counseling: Secondary | ICD-10-CM

## 2019-07-15 DIAGNOSIS — C220 Liver cell carcinoma: Secondary | ICD-10-CM

## 2019-07-15 DIAGNOSIS — R634 Abnormal weight loss: Secondary | ICD-10-CM | POA: Diagnosis not present

## 2019-07-15 LAB — CBC WITH DIFFERENTIAL/PLATELET
Abs Immature Granulocytes: 0.01 10*3/uL (ref 0.00–0.07)
Basophils Absolute: 0 10*3/uL (ref 0.0–0.1)
Basophils Relative: 1 %
Eosinophils Absolute: 0.3 10*3/uL (ref 0.0–0.5)
Eosinophils Relative: 5 %
HCT: 45.2 % (ref 39.0–52.0)
Hemoglobin: 14.8 g/dL (ref 13.0–17.0)
Immature Granulocytes: 0 %
Lymphocytes Relative: 25 %
Lymphs Abs: 1.4 10*3/uL (ref 0.7–4.0)
MCH: 31.2 pg (ref 26.0–34.0)
MCHC: 32.7 g/dL (ref 30.0–36.0)
MCV: 95.4 fL (ref 80.0–100.0)
Monocytes Absolute: 0.3 10*3/uL (ref 0.1–1.0)
Monocytes Relative: 6 %
Neutro Abs: 3.5 10*3/uL (ref 1.7–7.7)
Neutrophils Relative %: 63 %
Platelets: 146 10*3/uL — ABNORMAL LOW (ref 150–400)
RBC: 4.74 MIL/uL (ref 4.22–5.81)
RDW: 13.2 % (ref 11.5–15.5)
WBC: 5.5 10*3/uL (ref 4.0–10.5)
nRBC: 0 % (ref 0.0–0.2)

## 2019-07-15 LAB — COMPREHENSIVE METABOLIC PANEL
ALT: 48 U/L — ABNORMAL HIGH (ref 0–44)
AST: 52 U/L — ABNORMAL HIGH (ref 15–41)
Albumin: 4.1 g/dL (ref 3.5–5.0)
Alkaline Phosphatase: 148 U/L — ABNORMAL HIGH (ref 38–126)
Anion gap: 8 (ref 5–15)
BUN: 13 mg/dL (ref 8–23)
CO2: 24 mmol/L (ref 22–32)
Calcium: 9 mg/dL (ref 8.9–10.3)
Chloride: 101 mmol/L (ref 98–111)
Creatinine, Ser: 0.71 mg/dL (ref 0.61–1.24)
GFR calc Af Amer: >60 mL/min (ref >60)
GFR calc non Af Amer: >60 mL/min (ref >60)
Glucose, Bld: 93 mg/dL (ref 70–99)
Potassium: 3.1 mmol/L — ABNORMAL LOW (ref 3.5–5.1)
Sodium: 133 mmol/L — ABNORMAL LOW (ref 135–145)
Total Bilirubin: 0.5 mg/dL (ref 0.3–1.2)
Total Protein: 7.2 g/dL (ref 6.5–8.1)

## 2019-07-15 NOTE — Patient Instructions (Signed)
  Increase potassium to 1 pill twice a day.

## 2019-07-15 NOTE — Progress Notes (Signed)
Patient here for follow up. Denies any concerns.  

## 2019-07-16 LAB — AFP TUMOR MARKER: AFP, Serum, Tumor Marker: 603 ng/mL — ABNORMAL HIGH (ref 0.0–8.3)

## 2019-07-19 ENCOUNTER — Inpatient Hospital Stay: Payer: Managed Care, Other (non HMO)

## 2019-07-19 NOTE — Progress Notes (Signed)
Nutrition Follow-up:  Patient with hepatocellular carcinoma on lenvatinib, dose reduced  Spoke with patient via phone for nutrition follow-up.  Patient reports that weight has gone back down, not sure why.  Reports issues with nausea.  Taking nausea medications 30 minutes before eating.  Reports that he is eating foods that he knows he can tolerate ( sausage and gravy, peanut butter crackers and sandwichs, pork and beans).  Reports some issues with abdominal pain.  Still trying to drink boost shakes BID.  Reports bowels are moving normally.      Medications: reviewed  Labs: Na 133, K 3.1  Anthropometrics:   Weight 112 lb 12.2 oz on 3/25.  Noted 116 lb on 3/4.   113 lb on 2/25    NUTRITION DIAGNOSIS: Inadequate oral intake continues   INTERVENTION:  Patient to continue boost plus shakes at least BID Encouraged continued intake of high calorie, high protein foods to prevent continued weight loss.  Patient will continue taking nausea medications If weight continues to decline patient may benefit from trial of appetite stimulant.      MONITORING, EVALUATION, GOAL: Patient will continue to consume adequate calories and protein to prevent weight loss   NEXT VISIT: phone f/u May 3  Chris Mcdonald, [place], [place] Registered Dietitian [phone] (pager)

## 2019-07-20 ENCOUNTER — Telehealth: Payer: Self-pay | Admitting: Pharmacist

## 2019-07-20 ENCOUNTER — Other Ambulatory Visit: Payer: Self-pay | Admitting: Pharmacist

## 2019-07-20 DIAGNOSIS — C220 Liver cell carcinoma: Secondary | ICD-10-CM

## 2019-07-20 MED ORDER — LENVIMA (4 MG DAILY DOSE) 4 MG PO CPPK: 4.0000 mg | ORAL_CAPSULE | Freq: Every day | ORAL | 0 refills | Status: DC

## 2019-07-20 NOTE — Telephone Encounter (Signed)
Oral Oncology Pharmacist Encounter   Received notification from [place], [place] that prior authorization for Chris Mcdonald is required.   PA submitted on CMM through [place]  Status is pending   Oral Oncology Clinic will continue to follow.   Darl Pikes, PharmD, BCPS, [facility] Hematology/Oncology Clinical Pharmacist [facility] [place] [facility] [phone]  07/20/2019 3:25 PM

## 2019-07-20 NOTE — Telephone Encounter (Signed)
Oral Oncology Pharmacist Encounter  Patient also has coverage through [facility]. Submitted for Lenvima coverage through [place] and they stated the request needed to go directly to [facility]   PA submitted via telephone Case # [id number] (urgent) Status is pending   Oral Oncology Clinic will continue to follow.   Darl Pikes, PharmD, BCPS, [facility] Hematology/Oncology Clinical Pharmacist [facility] [place] [facility] [phone]  07/20/2019 4:19 PM

## 2019-07-22 NOTE — Telephone Encounter (Signed)
Oral Oncology Pharmacist Encounter   Prior Authorization for Lenvima through [place] has been approved.     PA# [id number] Effective dates: 07/20/2019 through 07/19/2020   Oral Oncology Clinic will continue to follow.   Darl Pikes, PharmD, BCPS. BCOP Hematology/Oncology Clinical Pharmacist [facility] Oral Chemotherapy [facility] Clinic [phone]  07/22/2019 9:12 AM

## 2019-07-26 ENCOUNTER — Other Ambulatory Visit: Payer: Self-pay | Admitting: Hematology and Oncology

## 2019-07-26 DIAGNOSIS — R1013 Epigastric pain: Secondary | ICD-10-CM

## 2019-07-29 ENCOUNTER — Inpatient Hospital Stay: Payer: BC Managed Care – PPO | Attending: Hematology and Oncology

## 2019-07-29 ENCOUNTER — Other Ambulatory Visit: Payer: Self-pay | Admitting: Pharmacist

## 2019-07-29 ENCOUNTER — Other Ambulatory Visit: Payer: Self-pay

## 2019-07-29 DIAGNOSIS — Z7902 Long term (current) use of antithrombotics/antiplatelets: Secondary | ICD-10-CM | POA: Diagnosis not present

## 2019-07-29 DIAGNOSIS — Z79899 Other long term (current) drug therapy: Secondary | ICD-10-CM | POA: Diagnosis not present

## 2019-07-29 DIAGNOSIS — Z8673 Personal history of transient ischemic attack (TIA), and cerebral infarction without residual deficits: Secondary | ICD-10-CM | POA: Diagnosis not present

## 2019-07-29 DIAGNOSIS — Z87891 Personal history of nicotine dependence: Secondary | ICD-10-CM | POA: Diagnosis not present

## 2019-07-29 DIAGNOSIS — Z8249 Family history of ischemic heart disease and other diseases of the circulatory system: Secondary | ICD-10-CM | POA: Diagnosis not present

## 2019-07-29 DIAGNOSIS — C220 Liver cell carcinoma: Secondary | ICD-10-CM

## 2019-07-29 DIAGNOSIS — Z7982 Long term (current) use of aspirin: Secondary | ICD-10-CM | POA: Insufficient documentation

## 2019-07-29 DIAGNOSIS — F17210 Nicotine dependence, cigarettes, uncomplicated: Secondary | ICD-10-CM | POA: Diagnosis not present

## 2019-07-29 DIAGNOSIS — E876 Hypokalemia: Secondary | ICD-10-CM | POA: Diagnosis not present

## 2019-07-29 DIAGNOSIS — I10 Essential (primary) hypertension: Secondary | ICD-10-CM | POA: Diagnosis not present

## 2019-07-29 LAB — BASIC METABOLIC PANEL
Anion gap: 9 (ref 5–15)
BUN: 13 mg/dL (ref 8–23)
CO2: 26 mmol/L (ref 22–32)
Calcium: 9.1 mg/dL (ref 8.9–10.3)
Chloride: 102 mmol/L (ref 98–111)
Creatinine, Ser: 0.67 mg/dL (ref 0.61–1.24)
GFR calc Af Amer: >60 mL/min (ref >60)
GFR calc non Af Amer: >60 mL/min (ref >60)
Glucose, Bld: 77 mg/dL (ref 70–99)
Potassium: 3.6 mmol/L (ref 3.5–5.1)
Sodium: 137 mmol/L (ref 135–145)

## 2019-07-29 MED ORDER — LENVIMA (4 MG DAILY DOSE) 4 MG PO CPPK: 4.0000 mg | ORAL_CAPSULE | Freq: Every day | ORAL | 0 refills | Status: DC

## 2019-07-29 NOTE — Progress Notes (Signed)
Oral Chemotherapy Pharmacist Encounter   Patient has had a change in insurance. Lenvima now needs to be filled at [facility]. Patient informed and provided information for [facility].  Darl Pikes, PharmD, BCPS, BCOP, CPP Hematology/Oncology Clinical Pharmacist [facility] [place] [facility] [phone]  07/29/2019 4:23 PM

## 2019-08-06 ENCOUNTER — Other Ambulatory Visit: Payer: Self-pay

## 2019-08-06 ENCOUNTER — Observation Stay: Payer: BC Managed Care – PPO

## 2019-08-06 ENCOUNTER — Encounter: Payer: Self-pay | Admitting: Emergency Medicine

## 2019-08-06 ENCOUNTER — Emergency Department: Payer: BC Managed Care – PPO

## 2019-08-06 ENCOUNTER — Inpatient Hospital Stay
Admission: EM | Admit: 2019-08-06 | Discharge: 2019-08-08 | DRG: 069 | Disposition: A | Discharge status: Home / Self Care | Payer: BC Managed Care – PPO | Attending: Internal Medicine | Admitting: Internal Medicine

## 2019-08-06 DIAGNOSIS — Z7982 Long term (current) use of aspirin: Secondary | ICD-10-CM

## 2019-08-06 DIAGNOSIS — I639 Cerebral infarction, unspecified: Secondary | ICD-10-CM

## 2019-08-06 DIAGNOSIS — R200 Anesthesia of skin: Secondary | ICD-10-CM | POA: Diagnosis present

## 2019-08-06 DIAGNOSIS — C229 Malignant neoplasm of liver, not specified as primary or secondary: Secondary | ICD-10-CM | POA: Diagnosis not present

## 2019-08-06 DIAGNOSIS — G459 Transient cerebral ischemic attack, unspecified: Secondary | ICD-10-CM | POA: Diagnosis not present

## 2019-08-06 DIAGNOSIS — Z981 Arthrodesis status: Secondary | ICD-10-CM

## 2019-08-06 DIAGNOSIS — E876 Hypokalemia: Secondary | ICD-10-CM | POA: Diagnosis present

## 2019-08-06 DIAGNOSIS — Z809 Family history of malignant neoplasm, unspecified: Secondary | ICD-10-CM

## 2019-08-06 DIAGNOSIS — F17210 Nicotine dependence, cigarettes, uncomplicated: Secondary | ICD-10-CM | POA: Diagnosis present

## 2019-08-06 DIAGNOSIS — I69354 Hemiplegia and hemiparesis following cerebral infarction affecting left non-dominant side: Secondary | ICD-10-CM

## 2019-08-06 DIAGNOSIS — I10 Essential (primary) hypertension: Secondary | ICD-10-CM | POA: Diagnosis present

## 2019-08-06 DIAGNOSIS — Z8249 Family history of ischemic heart disease and other diseases of the circulatory system: Secondary | ICD-10-CM

## 2019-08-06 DIAGNOSIS — Z79899 Other long term (current) drug therapy: Secondary | ICD-10-CM

## 2019-08-06 DIAGNOSIS — Z20822 Contact with and (suspected) exposure to covid-19: Secondary | ICD-10-CM | POA: Diagnosis present

## 2019-08-06 DIAGNOSIS — K219 Gastro-esophageal reflux disease without esophagitis: Secondary | ICD-10-CM | POA: Diagnosis present

## 2019-08-06 DIAGNOSIS — R29701 NIHSS score 1: Secondary | ICD-10-CM | POA: Diagnosis present

## 2019-08-06 HISTORY — DX: Malignant neoplasm of liver, not specified as primary or secondary: C22.9

## 2019-08-06 LAB — HEMOGLOBIN A1C
Hgb A1c MFr Bld: 5.2 % (ref 4.8–5.6)
Mean Plasma Glucose: 102.54 mg/dL

## 2019-08-06 LAB — CBC WITH DIFFERENTIAL/PLATELET
Abs Immature Granulocytes: 0.01 10*3/uL (ref 0.00–0.07)
Basophils Absolute: 0 10*3/uL (ref 0.0–0.1)
Basophils Relative: 1 %
Eosinophils Absolute: 0.1 10*3/uL (ref 0.0–0.5)
Eosinophils Relative: 2 %
HCT: 46.5 % (ref 39.0–52.0)
Hemoglobin: 16 g/dL (ref 13.0–17.0)
Immature Granulocytes: 0 %
Lymphocytes Relative: 27 %
Lymphs Abs: 1.4 10*3/uL (ref 0.7–4.0)
MCH: 31.8 pg (ref 26.0–34.0)
MCHC: 34.4 g/dL (ref 30.0–36.0)
MCV: 92.4 fL (ref 80.0–100.0)
Monocytes Absolute: 0.3 10*3/uL (ref 0.1–1.0)
Monocytes Relative: 6 %
Neutro Abs: 3.2 10*3/uL (ref 1.7–7.7)
Neutrophils Relative %: 64 %
Platelets: 147 10*3/uL — ABNORMAL LOW (ref 150–400)
RBC: 5.03 MIL/uL (ref 4.22–5.81)
RDW: 12.5 % (ref 11.5–15.5)
WBC: 5 10*3/uL (ref 4.0–10.5)
nRBC: 0 % (ref 0.0–0.2)

## 2019-08-06 LAB — PROTIME-INR
INR: 1 (ref 0.8–1.2)
Prothrombin Time: 13.3 seconds (ref 11.4–15.2)

## 2019-08-06 LAB — BASIC METABOLIC PANEL
Anion gap: 10 (ref 5–15)
BUN: 16 mg/dL (ref 8–23)
CO2: 27 mmol/L (ref 22–32)
Calcium: 9.6 mg/dL (ref 8.9–10.3)
Chloride: 100 mmol/L (ref 98–111)
Creatinine, Ser: 0.84 mg/dL (ref 0.61–1.24)
GFR calc Af Amer: >60 mL/min (ref >60)
GFR calc non Af Amer: >60 mL/min (ref >60)
Glucose, Bld: 92 mg/dL (ref 70–99)
Potassium: 3.2 mmol/L — ABNORMAL LOW (ref 3.5–5.1)
Sodium: 137 mmol/L (ref 135–145)

## 2019-08-06 LAB — CBC
HCT: 46.9 % (ref 39.0–52.0)
Hemoglobin: 16 g/dL (ref 13.0–17.0)
MCH: 31.5 pg (ref 26.0–34.0)
MCHC: 34.1 g/dL (ref 30.0–36.0)
MCV: 92.3 fL (ref 80.0–100.0)
Platelets: 147 10*3/uL — ABNORMAL LOW (ref 150–400)
RBC: 5.08 MIL/uL (ref 4.22–5.81)
RDW: 12.4 % (ref 11.5–15.5)
WBC: 5.1 10*3/uL (ref 4.0–10.5)
nRBC: 0 % (ref 0.0–0.2)

## 2019-08-06 LAB — SARS CORONAVIRUS 2 (TAT 6-24 HRS): SARS Coronavirus 2: NEGATIVE

## 2019-08-06 LAB — APTT: aPTT: 35 seconds (ref 24–36)

## 2019-08-06 LAB — GLUCOSE, CAPILLARY: Glucose-Capillary: 79 mg/dL (ref 70–99)

## 2019-08-06 MED ORDER — ACETAMINOPHEN 325 MG PO TABS
650.0000 mg | ORAL_TABLET | ORAL | Status: DC | PRN
  Administered 2019-08-06: 650 mg via ORAL
  Filled 2019-08-06: qty 2

## 2019-08-06 MED ORDER — SODIUM CHLORIDE 0.9% FLUSH
3.0000 mL | Freq: Once | INTRAVENOUS | Status: AC
  Administered 2019-08-06: 3 mL via INTRAVENOUS

## 2019-08-06 MED ORDER — ASPIRIN EC 81 MG PO TBEC
81.0000 mg | DELAYED_RELEASE_TABLET | Freq: Every day | ORAL | Status: DC
  Administered 2019-08-07 – 2019-08-08 (×2): 81 mg via ORAL
  Filled 2019-08-06 (×2): qty 1

## 2019-08-06 MED ORDER — POTASSIUM CHLORIDE CRYS ER 20 MEQ PO TBCR
20.0000 meq | EXTENDED_RELEASE_TABLET | Freq: Every day | ORAL | Status: DC
  Administered 2019-08-07 – 2019-08-08 (×2): 20 meq via ORAL
  Filled 2019-08-06 (×2): qty 1

## 2019-08-06 MED ORDER — ONDANSETRON HCL 4 MG PO TABS
8.0000 mg | ORAL_TABLET | Freq: Three times a day (TID) | ORAL | Status: DC | PRN
  Administered 2019-08-07: 10:00:00 8 mg via ORAL
  Filled 2019-08-06: qty 2

## 2019-08-06 MED ORDER — CLOPIDOGREL BISULFATE 75 MG PO TABS
75.0000 mg | ORAL_TABLET | Freq: Once | ORAL | Status: AC
  Administered 2019-08-06: 18:00:00 75 mg via ORAL
  Filled 2019-08-06: qty 1

## 2019-08-06 MED ORDER — SODIUM CHLORIDE 0.9% FLUSH: 3.0000 mL | Freq: Once | INTRAVENOUS | Status: DC

## 2019-08-06 MED ORDER — ACETAMINOPHEN 650 MG RE SUPP: 650.0000 mg | RECTAL | Status: DC | PRN

## 2019-08-06 MED ORDER — PANTOPRAZOLE SODIUM 40 MG PO TBEC
40.0000 mg | DELAYED_RELEASE_TABLET | Freq: Every day | ORAL | Status: DC
  Administered 2019-08-07 – 2019-08-08 (×2): 40 mg via ORAL
  Filled 2019-08-06 (×2): qty 1

## 2019-08-06 MED ORDER — FLUTICASONE PROPIONATE 50 MCG/ACT NA SUSP
2.0000 | Freq: Every day | NASAL | Status: DC
  Administered 2019-08-07 – 2019-08-08 (×2): 2 via NASAL
  Filled 2019-08-06: qty 16

## 2019-08-06 MED ORDER — STROKE: EARLY STAGES OF RECOVERY BOOK: Freq: Once | Status: AC

## 2019-08-06 MED ORDER — CLOPIDOGREL BISULFATE 75 MG PO TABS
75.0000 mg | ORAL_TABLET | Freq: Every day | ORAL | Status: DC
  Administered 2019-08-07 – 2019-08-08 (×2): 75 mg via ORAL
  Filled 2019-08-06 (×2): qty 1

## 2019-08-06 MED ORDER — POTASSIUM CHLORIDE CRYS ER 20 MEQ PO TBCR
40.0000 meq | EXTENDED_RELEASE_TABLET | Freq: Once | ORAL | Status: AC
  Administered 2019-08-06: 18:00:00 40 meq via ORAL
  Filled 2019-08-06: qty 2

## 2019-08-06 MED ORDER — ATORVASTATIN CALCIUM 20 MG PO TABS
40.0000 mg | ORAL_TABLET | Freq: Every day | ORAL | Status: DC
  Administered 2019-08-06 – 2019-08-07 (×2): 40 mg via ORAL
  Filled 2019-08-06 (×2): qty 2

## 2019-08-06 MED ORDER — GADOBUTROL 1 MMOL/ML IV SOLN
4.0000 mL | Freq: Once | INTRAVENOUS | Status: AC | PRN
  Administered 2019-08-06: 22:00:00 7.5 mL via INTRAVENOUS

## 2019-08-06 MED ORDER — HYDROCHLOROTHIAZIDE 25 MG PO TABS
12.5000 mg | ORAL_TABLET | Freq: Every day | ORAL | Status: DC
  Administered 2019-08-07 – 2019-08-08 (×2): 12.5 mg via ORAL
  Filled 2019-08-06 (×2): qty 1

## 2019-08-06 MED ORDER — ACETAMINOPHEN 160 MG/5ML PO SOLN
650.0000 mg | ORAL | Status: DC | PRN
  Filled 2019-08-06: qty 20.3

## 2019-08-06 MED ORDER — LENVATINIB (4 MG DAILY DOSE) 4 MG PO CPPK
8.0000 mg | ORAL_CAPSULE | Freq: Every day | ORAL | Status: DC
  Administered 2019-08-08: 8 mg via ORAL

## 2019-08-06 MED ORDER — ENOXAPARIN SODIUM 40 MG/0.4ML ~~LOC~~ SOLN
40.0000 mg | SUBCUTANEOUS | Status: DC
  Administered 2019-08-06 – 2019-08-07 (×2): 40 mg via SUBCUTANEOUS
  Filled 2019-08-06 (×2): qty 0.4

## 2019-08-06 MED ORDER — MUPIROCIN 2 % EX OINT
TOPICAL_OINTMENT | Freq: Two times a day (BID) | CUTANEOUS | Status: DC
  Filled 2019-08-06: qty 22

## 2019-08-06 NOTE — ED Notes (Signed)
Tele-neurologist came onto the screen at this time.

## 2019-08-06 NOTE — ED Triage Notes (Signed)
Pt to ED via POV c/o left side numbness. Pt states that his entire left side is numb. Pt does not have any slurred speech or facial droop at this time. Pt states that he have a headache and that he feels light headed. Pt last known well is around 12 noon. Pt states that he has hx/o previous CVA that left him with left sided numbness but the numbness feels more intense than normal.

## 2019-08-06 NOTE — ED Notes (Signed)
Pt given dinner tray.

## 2019-08-06 NOTE — ED Notes (Signed)
Called code stroke to [phone]

## 2019-08-06 NOTE — ED Provider Notes (Signed)
[facility] Emergency Department Provider Note  ____________________________________________   First MD Initiated Contact with Patient 08/06/19 1612     (approximate)  I have reviewed the triage vital signs and the nursing notes.   HISTORY  Chief Complaint Numbness    HPI Chris Mcdonald is a 64 y.o. male with history of prior stroke with some residual left-sided numbness who comes in with significantly worsening left-sided numbness from his baseline.  Stated that he felt his normal self around noon but then it got worse.  Patient states at baseline he normally has some numbness tingling sensation in his left arm but today he has had tingling and numbness in the left face and left leg as well.  Denies any associated weakness with it.  Denies any slurred speech, changes in his face, changes in his vision.  The symptoms have been constant, nothing makes it better, nothing makes them worse.  To note patient has a history of hepatocellular carcinoma and is on oral chemotherapy          Past Medical History:  Diagnosis Date  . Arthritis   . Hepatitis   . Hypertension   . Spinal stenosis   . Stroke [facility])     Patient Active Problem List   Diagnosis Date Noted  . Other secondary hypertension 05/22/2019  . Weight loss 01/03/2019  . Hyponatremia 09/19/2018  . Hypokalemia 09/19/2018  . Lung nodules 09/17/2018  . Hypercalcemia 08/17/2018  . Goals of care, counseling/discussion 08/16/2018  . Hepatocellular carcinoma ([place]) 07/26/2018    Past Surgical History:  Procedure Laterality Date  . COLONOSCOPY WITH PROPOFOL N/A 02/21/2015   Procedure: COLONOSCOPY WITH PROPOFOL;  Surgeon: Lollie Sails, MD;  Location: [facility] ENDOSCOPY;  Service: Endoscopy;  Laterality: N/A;  . INGUINAL HERNIA REPAIR Bilateral   . IR RADIOLOGIST EVAL & MGMT  08/04/2018  . POSTERIOR CERVICAL VERTEBRAE EXCISION      Prior to Admission medications   Medication Sig Start Date End  Date Taking? Authorizing Provider  amLODipine (NORVASC) 5 MG tablet Take 10 mg by mouth daily.  09/21/18   [provider]  aspirin EC 81 MG tablet Take 81 mg by mouth daily.     [provider]  fluticasone (FLONASE) 50 MCG/ACT nasal spray 1 spray by Each Nare route daily. 03/27/18   [provider]  hydrochlorothiazide (HYDRODIURIL) 12.5 MG tablet Take 25 mg by mouth daily.     [provider]  Lenvatinib 4 mg daily dose (LENVIMA, 4 MG DAILY DOSE,) capsule Take 1 capsule (4 mg total) by mouth daily. 07/29/19   Lequita Asal, MD  omeprazole (PRILOSEC) 20 MG capsule TAKE 1 CAPSULE BY MOUTH EVERY DAY 07/26/19   Lequita Asal, MD  ondansetron (ZOFRAN) 8 MG tablet Take 1 tablet (8 mg total) by mouth every 8 (eight) hours as needed for nausea or vomiting. 07/05/19   Lequita Asal, MD  potassium chloride SA (KLOR-CON M20) 20 MEQ tablet TAKE 1 TABLET (20 MEQ TOTAL) BY MOUTH DAILY. AS DIRECTED BY MD 07/05/19   Lequita Asal, MD    Allergies Patient has no known allergies.  Family History  Problem Relation Age of Onset  . Cancer Brother   . Heart disease Brother     Social History Social History   Tobacco Use  . Smoking status: Current Every Day Smoker    Packs/day: 1.50    Years: 50.00    Pack years: 75.00    Types: Cigarettes  .  Smokeless tobacco: Never Used  Substance Use Topics  . Alcohol use: No  . Drug use: No      Review of Systems Constitutional: No fever/chills Eyes: No visual changes. ENT: No sore throat. Cardiovascular: Denies chest pain. Respiratory: Denies shortness of breath. Gastrointestinal: No abdominal pain.  No nausea, no vomiting.  No diarrhea.  No constipation. Genitourinary: Negative for dysuria. Musculoskeletal: Negative for back pain. Skin: Negative for rash. Neurological: Negative for headaches, focal weakness changes in sensation on the left side of his body All other ROS  negative ____________________________________________   PHYSICAL EXAM:  VITAL SIGNS: ED Triage Vitals [08/06/19 1559]  Enc Vitals Group     BP (!) 133/95     Pulse Rate 79     Resp 16     Temp (!) 97.5 F (36.4 C)     Temp Source Oral     SpO2 99 %     Weight 110 lb (49.9 kg)     Height 5\' 7"  (1.702 m)     Head Circumference      Peak Flow      Pain Score 7     Pain Loc      Pain Edu?      Excl. in [place]?     Constitutional: Alert and oriented. Well appearing and in no acute distress. Eyes: Conjunctivae are normal. EOMI. Head: Atraumatic. Nose: No congestion/rhinnorhea. Mouth/Throat: Mucous membranes are moist.   Neck: No stridor. Trachea Midline. FROM Cardiovascular: Normal rate, regular rhythm. Grossly normal heart sounds.  Good peripheral circulation. Respiratory: Normal respiratory effort.  No retractions. Lungs CTAB. Gastrointestinal: Soft and nontender. No distention. No abdominal bruits.  Musculoskeletal: No lower extremity tenderness nor edema.  No joint effusions. Neurologic: NIH stroke scale of 1 for left-sided sensation changes but patient can still feel my touch.  Equal strength in arms and legs Skin:  Skin is warm, dry and intact. No rash noted. Psychiatric: Mood and affect are normal. Speech and behavior are normal. GU: Deferred   ____________________________________________   LABS (all labs ordered are listed, but only abnormal results are displayed)  Labs Reviewed  BASIC METABOLIC PANEL - Abnormal; Notable for the following components:      Result Value   Potassium 3.2 (*)    All other components within normal limits  CBC - Abnormal; Notable for the following components:   Platelets 147 (*)    All other components within normal limits  GLUCOSE, CAPILLARY  URINALYSIS, COMPLETE (UACMP) WITH MICROSCOPIC  PROTIME-INR  APTT  DIFFERENTIAL  CBG MONITORING, ED  CBG MONITORING, ED   ____________________________________________   ED ECG REPORT I,  Vanessa Nenzel, the attending physician, personally viewed and interpreted this ECG.  EKG is normal sinus rate of 84, no ST elevation, no T wave inversions, normal intervals but does have some right axis deviation ____________________________________________  RADIOLOGY   Official radiology report(s): CT HEAD CODE STROKE WO CONTRAST  Result Date: 08/06/2019 CLINICAL DATA:  Code stroke. EXAM: CT HEAD WITHOUT CONTRAST TECHNIQUE: Contiguous axial images were obtained from the base of the skull through the vertex without intravenous contrast. COMPARISON:  None. FINDINGS: Brain: There is no acute intracranial hemorrhage, mass effect, or edema. Gray-white differentiation is preserved. There is a 4 mm small vessel infarct of the right thalamus. Ventricles and sulci are normal in size and configuration. There is no extra-axial fluid collection. Vascular: No definite hyperdense vessel, noting presence of streak artifact through the circle-of-Willis. There is intracranial atherosclerotic calcification at  the skull base. Skull: Unremarkable. Sinuses/Orbits: Minimally imaged left maxillary sinus opacification. Orbits are unremarkable. Other: Mastoid air cells are clear. ASPECTS [facility] Stroke Program Early CT Score) - Ganglionic level infarction (caudate, lentiform nuclei, internal capsule, insula, M1-M3 cortex): 7 - Supraganglionic infarction (M4-M6 cortex): 3 Total score (0-10 with 10 being normal): 10 IMPRESSION: No acute intracranial hemorrhage. ASPECT score is 10. There is an age-indeterminate lacunar infarct of the right thalamus. These results were called by telephone at the time of interpretation on 08/06/2019 at 4:24 pm to provider [facility] , who verbally acknowledged these results. Electronically Signed   By: Macy Mis M.D.   On: 08/06/2019 16:28    ____________________________________________   PROCEDURES  Procedure(s) performed (including Critical  Care):  Procedures   ____________________________________________   INITIAL IMPRESSION / ASSESSMENT AND PLAN / ED COURSE  Chris Mcdonald was evaluated in Emergency Department on 08/06/2019 for the symptoms described in the history of present illness. He was evaluated in the context of the global COVID-19 pandemic, which necessitated consideration that the patient might be at risk for infection with the SARS-CoV-2 virus that causes COVID-19. Institutional protocols and algorithms that pertain to the evaluation of patients at risk for COVID-19 are in a state of rapid change based on information released by regulatory bodies including the CDC and federal and state organizations. These policies and algorithms were followed during the patient's care in the ED.     Patient comes in with new left-sided numbness and tingling.  Stroke code was called given within four 1/2 hours of symptom onset.  Patient was brought back to the room.  Patient has a NIH stroke scale of 1.  CT head without any evidence of bleed but does show a thalamic stroke on the right.  Unclear if this is new or old.  Electrolytes were ordered, glucose was ordered and labs ordered evaluate for AKI.  Dr. Jack Quarto who does not recommend TPA at this time due to low stroke scale and his complex medical history.Marland Kitchen  He does recommends admission for MRI with and without contrast, given a dose of Plavix 75 mg since he already took his aspirin today and admitted to the hospital for further stroke work-up.      ____________________________________________   FINAL CLINICAL IMPRESSION(S) / ED DIAGNOSES   Final diagnoses:  Numbness  Cerebrovascular accident (CVA), unspecified mechanism ([place])      MEDICATIONS GIVEN DURING THIS VISIT:  Medications  sodium chloride flush (NS) 0.9 % injection 3 mL (has no administration in time range)  sodium chloride flush (NS) 0.9 % injection 3 mL (has no administration in time range)  clopidogrel  (PLAVIX) tablet 75 mg (has no administration in time range)     ED Discharge Orders    None       Note:  This document was prepared using Dragon voice recognition software and may include unintentional dictation errors.   Vanessa Bronwood, MD 08/06/19 [phone]

## 2019-08-06 NOTE — Consult Note (Signed)
[facility] Tele[facility]urology Consult Services   Date of Service:   08/06/2019 16:22:07  Impression:     .  I63.9 - Cerebrovascular accident (CVA), unspecified mechanism ([place])  Comments/Sign-Out: there is an age-indeterminate abnormality in the right thalamus but that likely is his prior stroke since he had baseline numbness. I would recommend MRI to had the CVC had not ischemia or extension of that infarct and given his history of liver cancer I would do that study with and without contrast which will also look for metastasis  Metrics: Last Known Well: 08/06/2019 12:00:00 [facility] Notification Time: 08/06/2019 16:22:07 Arrival Time: 08/06/2019 15:43:00 Stamp Time: 08/06/2019 16:22:07 Time First Login Attempt: 08/06/2019 16:27:52 Symptoms: left side numbness NIHSS Start Assessment Time: 08/06/2019 16:36:13 Patient is not a candidate for Alteplase/Activase. Alteplase Medical Decision: 08/06/2019 16:41:51 Patient was not deemed candidate for Alteplase/Activase thrombolytics because of Resolved symptoms (no residual disabling symptoms).  CT head showed no acute hemorrhage or acute core infarct. CT head was reviewed and results were: CLINICAL DATA: Code stroke.   EXAM: CT HEAD WITHOUT CONTRAST   TECHNIQUE: Contiguous axial images were obtained from the base of the skull through the vertex without intravenous contrast.   COMPARISON: None.   FINDINGS: Brain: There is no acute intracranial hemorrhage, mass effect, or edema. Gray-white differentiation is preserved. There is a 4 mm small vessel infarct of the right thalamus. Ventricles and sulci are normal in size and configuration. There is no extra-axial fluid collection.   Vascular: No definite hyperdense vessel, noting presence of streak artifact through the circle-of-Willis. There is intracranial atherosclerotic calcification at the skull base.   Skull: Unremarkable.   Sinuses/Orbits: Minimally imaged left maxillary sinus opacification.  Orbits are unremarkable.   Other: Mastoid air cells are clear.   ASPECTS [facility] Stroke Program Early CT Score)   - Ganglionic level infarction (caudate, lentiform nuclei, internal capsule, insula, M1-M3 cortex): 7   - Supraganglionic infarction (M4-M6 cortex): 3   Total score (0-10 with 10 being normal): 10   IMPRESSION: No acute intracranial hemorrhage. ASPECT score is 10. There is an age-indeterminate lacunar infarct of the right thalamus.   These results were called by telephone at the time of interpretation on 08/06/2019 at 4:24 pm to provider [facility] , who verbally acknowledged these results.     Electronically Signed By: Macy Mis M.D. On: 08/06/2019 16:28  Clinical Presentation is not Suggestive of Large Vessel Occlusive Disease  ED Physician notified of diagnostic impression and management plan on 08/06/2019 16:43:21  Our recommendations are outlined below.  Recommendations:     .  Activate Stroke Protocol Admission/Order Set     .  Stroke/Telemetry Floor     .  Neuro Checks     .  Bedside Swallow Eval     .  DVT Prophylaxis     .  IV Fluids, Normal Saline     .  Head of Bed 30 Degrees     .  Euglycemia and Avoid Hyperthermia (PRN Acetaminophen)     .  Initiate Aspirin 81 MG Daily     .  Initiate Plavix 75 MG Daily     .  MRI head with and without contrast  Routine Consultation with [facility] for Follow up Care  Sign Out:     .  Discussed with Emergency Department Provider    ------------------------------------------------------------------------------  History of Present Illness: Patient is a 64 year old Male.  Patient was brought by private transportation with symptoms of left side  numbness  this is a 64 year old male who reports a history of liver cancer and prior stroke who presents with numbness. He has a history of prior stroke which left him with some residual numbness of the left side of his body today he felt like that numbness got much worse. He  has no speech problems or new weakness    Antiplatelet use: aspirin    Examination: BP(133/95), Pulse(79), Blood Glucose(79) 1A: Level of Consciousness - Alert; keenly responsive + 0 1B: Ask Month and Age - Both Questions Right + 0 1C: Blink Eyes & Squeeze Hands - Performs Both Tasks + 0 2: Test Horizontal Extraocular Movements - Normal + 0 3: Test Visual Fields - No Visual Loss + 0 4: Test Facial Palsy (Use Grimace if Obtunded) - Normal symmetry + 0 5A: Test Left Arm Motor Drift - No Drift for 10 Seconds + 0 5B: Test Right Arm Motor Drift - No Drift for 10 Seconds + 0 6A: Test Left Leg Motor Drift - No Drift for 5 Seconds + 0 6B: Test Right Leg Motor Drift - No Drift for 5 Seconds + 0 7: Test Limb Ataxia (FNF/Heel-Shin) - No Ataxia + 0 8: Test Sensation - Mild-Moderate Loss: Less Sharp/More Dull + 1 9: Test Language/Aphasia - Normal; No aphasia + 0 10: Test Dysarthria - Normal + 0 11: Test Extinction/Inattention - No abnormality + 0  NIHSS Score: 1  Pre-Morbid Modified Ranking Scale: 1 Points = No significant disability despite symptoms; able to carry out all usual duties and activities   Patient/Family was informed the Neurology Consult would occur via TeleHealth consult by way of interactive audio and video telecommunications and consented to receiving care in this manner.   Due to the immediate potential for life-threatening deterioration due to underlying acute neurologic illness, I spent 20 minutes providing critical care. This time includes time for face to face visit via telemedicine, review of medical records, imaging studies and discussion of findings with providers, the patient and/or family.   Dr Janean Sark   [facility] [phone]  Case LI:[id number]

## 2019-08-06 NOTE — ED Notes (Signed)
Pt given this RN's ascom to answer MRI staff's screening questions. Visitor remains at bedside with pt. Pt alert and calmly sitting in bed.

## 2019-08-06 NOTE — H&P (Signed)
[facility] Chris Mcdonald at [place] NAME: Chris Mcdonald    MR#:  [id number]  DATE OF BIRTH:  March 01, 1956  DATE OF ADMISSION:  08/06/2019  PRIMARY CARE PHYSICIAN: Donnie Coffin, MD   REQUESTING/REFERRING PHYSICIAN: Dr Marjean Donna  CHIEF COMPLAINT:   Chief Complaint  Patient presents with  . Numbness    HISTORY OF PRESENT ILLNESS:  Chris Mcdonald  is a 64 y.o. male with a known history of stroke with left-sided numbness and weakness.  Today at 12 noon he thinks he had a light stroke.  He states his left face, left arm and leg became more numb than usual.  He always has a little baseline weakness on his left leg anyway.  He does not walk with any devices.  He feels that his left arm is strong.  He does have a little headache.  He was seen by tele neurology and they did not recommend TPA but recommended added Plavix and getting stroke work-up.  Hospitalist services were contacted for further evaluation.  PAST MEDICAL HISTORY:   Past Medical History:  Diagnosis Date  . Arthritis   . Hepatitis   . Hypertension   . Liver cancer ([place])   . Spinal stenosis   . Stroke [facility])     PAST SURGICAL HISTORY:   Past Surgical History:  Procedure Laterality Date  . COLONOSCOPY WITH PROPOFOL N/A 02/21/2015   Procedure: COLONOSCOPY WITH PROPOFOL;  Surgeon: Lollie Sails, MD;  Location: [facility] ENDOSCOPY;  Service: Endoscopy;  Laterality: N/A;  . INGUINAL HERNIA REPAIR Bilateral   . IR RADIOLOGIST EVAL & MGMT  08/04/2018  . POSTERIOR CERVICAL VERTEBRAE EXCISION      SOCIAL HISTORY:   Social History   Tobacco Use  . Smoking status: Current Every Day Smoker    Packs/day: 1.50    Years: 50.00    Pack years: 75.00    Types: Cigarettes  . Smokeless tobacco: Never Used  Substance Use Topics  . Alcohol use: No    FAMILY HISTORY:   Family History  Problem Relation Age of Onset  . Cancer Brother   . Heart disease Brother   . Cancer Mother     DRUG  ALLERGIES:  No Known Allergies  REVIEW OF SYSTEMS:  CONSTITUTIONAL: No fever, but feels cold.  Positive for weight loss.  Positive for fatigue and chronic left-sided weakness.  EYES: No blurred or double vision.  EARS, NOSE, AND THROAT: No tinnitus or ear pain. No sore throat.  Sometimes dysphagia to pills RESPIRATORY: No cough, shortness of breath, wheezing or hemoptysis.  CARDIOVASCULAR: No chest pain, orthopnea, edema.  GASTROINTESTINAL: Positive for nausea and vomiting.no diarrhea.  Occasional abdominal pain. No blood in bowel movements. GENITOURINARY: No dysuria, hematuria.  ENDOCRINE: No polyuria, nocturia,  HEMATOLOGY: No anemia, easy bruising or bleeding SKIN: No rash or lesion. MUSCULOSKELETAL: No joint pain or arthritis.   NEUROLOGIC: No tingling, numbness, weakness.  PSYCHIATRY: No anxiety or depression.   MEDICATIONS AT HOME:   Prior to Admission medications   Medication Sig Start Date End Date Taking? Authorizing Provider  amLODipine (NORVASC) 5 MG tablet Take 10 mg by mouth daily.  09/21/18   [provider]  aspirin EC 81 MG tablet Take 81 mg by mouth daily.     [provider]  fluticasone (FLONASE) 50 MCG/ACT nasal spray 1 spray by Each Nare route daily. 03/27/18   [provider]  hydrochlorothiazide (HYDRODIURIL) 12.5 MG tablet Take 25 mg by mouth  daily.     [provider]  Lenvatinib 4 mg daily dose (LENVIMA, 4 MG DAILY DOSE,) capsule Take 1 capsule (4 mg total) by mouth daily. 07/29/19   Lequita Asal, MD  omeprazole (PRILOSEC) 20 MG capsule TAKE 1 CAPSULE BY MOUTH EVERY DAY 07/26/19   Lequita Asal, MD  ondansetron (ZOFRAN) 8 MG tablet Take 1 tablet (8 mg total) by mouth every 8 (eight) hours as needed for nausea or vomiting. 07/05/19   Lequita Asal, MD  potassium chloride SA (KLOR-CON M20) 20 MEQ tablet TAKE 1 TABLET (20 MEQ TOTAL) BY MOUTH DAILY. AS DIRECTED BY MD 07/05/19   Lequita Asal, MD   Medication  reconciliation still undergoing The list that he showed me he takes Lenvima 8 mg daily and he did not have the Norvasc on the list.  VITAL SIGNS:  Blood pressure (!) 124/95, pulse 69, temperature (!) 97.5 F (36.4 C), temperature source Oral, resp. rate 16, height 5\' 7"  (1.702 m), weight 49.9 kg, SpO2 97 %.  PHYSICAL EXAMINATION:  GENERAL:  64 y.o.-year-old patient lying in the bed with no acute distress.  EYES: Pupils equal, round, reactive to light and accommodation. No scleral icterus. Extraocular muscles intact.  HEENT: Head atraumatic, normocephalic. Oropharynx and nasopharynx clear.  NECK:  Supple, no jugular venous distention. No thyroid enlargement, no tenderness.  LUNGS: Normal breath sounds bilaterally, no wheezing, rales,rhonchi or crepitation. No use of accessory muscles of respiration.  CARDIOVASCULAR: S1, S2 normal. No murmurs, rubs, or gallops.  ABDOMEN: Soft, nontender, nondistended. Bowel sounds present. No organomegaly or mass.  EXTREMITIES: No pedal edema, cyanosis, or clubbing.  NEUROLOGIC: Cranial nerves II through XII are intact. Muscle strength 5/5 in all extremities. Sensation to light touch decreased on the left side with his numbness.. Gait not checked.  PSYCHIATRIC: The patient is alert and oriented x 3.  SKIN: No rash, lesion, or ulcer.   LABORATORY PANEL:   CBC Recent Labs  Lab 08/06/19 1601  WBC 5.0  5.1  HGB 16.0  16.0  HCT 46.5  46.9  PLT 147*  147*   ------------------------------------------------------------------------------------------------------------------  Chemistries  Recent Labs  Lab 08/06/19 1601  NA 137  K 3.2*  CL 100  CO2 27  GLUCOSE 92  BUN 16  CREATININE 0.84  CALCIUM 9.6   ------------------------------------------------------------------------------------------------------------------   RADIOLOGY:  CT HEAD CODE STROKE WO CONTRAST  Result Date: 08/06/2019 CLINICAL DATA:  Code stroke. EXAM: CT HEAD WITHOUT  CONTRAST TECHNIQUE: Contiguous axial images were obtained from the base of the skull through the vertex without intravenous contrast. COMPARISON:  None. FINDINGS: Brain: There is no acute intracranial hemorrhage, mass effect, or edema. Gray-white differentiation is preserved. There is a 4 mm small vessel infarct of the right thalamus. Ventricles and sulci are normal in size and configuration. There is no extra-axial fluid collection. Vascular: No definite hyperdense vessel, noting presence of streak artifact through the circle-of-Willis. There is intracranial atherosclerotic calcification at the skull base. Skull: Unremarkable. Sinuses/Orbits: Minimally imaged left maxillary sinus opacification. Orbits are unremarkable. Other: Mastoid air cells are clear. ASPECTS [facility] Stroke Program Early CT Score) - Ganglionic level infarction (caudate, lentiform nuclei, internal capsule, insula, M1-M3 cortex): 7 - Supraganglionic infarction (M4-M6 cortex): 3 Total score (0-10 with 10 being normal): 10 IMPRESSION: No acute intracranial hemorrhage. ASPECT score is 10. There is an age-indeterminate lacunar infarct of the right thalamus. These results were called by telephone at the time of interpretation on 08/06/2019 at 4:24 pm to provider  Marjean Donna , who verbally acknowledged these results. Electronically Signed   By: Macy Mis M.D.   On: 08/06/2019 16:28    EKG:   EKG interpreted by me shows normal sinus rhythm 87 bpm, right atrial enlargement, right axis deviation  IMPRESSION AND PLAN:   1.  TIA with left-sided numbness.  Will admit as an observation.  Seen by tele neurology and recommended stroke work-up.  MRI of the brain, echocardiogram and carotid ultrasound and telemetry ordered.  Will get PT and OT consultations.  Continue his aspirin and add Plavix.  Check lipid profile and add Lipitor.  2.  Essential hypertension on hydrochlorothiazide.  On his list did not have the Norvasc but I can always add back if  he does take this.  We like to allow permissive hypertension anyway. 3.  History of liver cancer on Lenvima.  Follows with Dr. Mike Gip as outpatient 4.  Hypokalemia on potassium. 5.  GERD on omeprazole 6.  History of spinal stenosis  All the records, laboratory radiological data are reviewed and case discussed with ED provider. Management plans discussed with the patient, family and they are in agreement.  CODE STATUS: Full code  TOTAL TIME TAKING CARE OF THIS PATIENT: 50 minutes.    Loletha Grayer M.D on 08/06/2019 at 5:31 PM  Between 7am to 6pm - Pager - [phone]  After 6pm call admission pager [phone]  [facility]  CC: Primary care physician; Donnie Coffin, MD

## 2019-08-07 ENCOUNTER — Observation Stay: Payer: BC Managed Care – PPO

## 2019-08-07 DIAGNOSIS — Z20822 Contact with and (suspected) exposure to covid-19: Secondary | ICD-10-CM | POA: Diagnosis present

## 2019-08-07 DIAGNOSIS — Z79899 Other long term (current) drug therapy: Secondary | ICD-10-CM | POA: Diagnosis not present

## 2019-08-07 DIAGNOSIS — R29701 NIHSS score 1: Secondary | ICD-10-CM | POA: Diagnosis present

## 2019-08-07 DIAGNOSIS — Z7982 Long term (current) use of aspirin: Secondary | ICD-10-CM | POA: Diagnosis not present

## 2019-08-07 DIAGNOSIS — I69354 Hemiplegia and hemiparesis following cerebral infarction affecting left non-dominant side: Secondary | ICD-10-CM | POA: Diagnosis not present

## 2019-08-07 DIAGNOSIS — Z809 Family history of malignant neoplasm, unspecified: Secondary | ICD-10-CM | POA: Diagnosis not present

## 2019-08-07 DIAGNOSIS — C229 Malignant neoplasm of liver, not specified as primary or secondary: Secondary | ICD-10-CM | POA: Diagnosis present

## 2019-08-07 DIAGNOSIS — K219 Gastro-esophageal reflux disease without esophagitis: Secondary | ICD-10-CM | POA: Diagnosis present

## 2019-08-07 DIAGNOSIS — G459 Transient cerebral ischemic attack, unspecified: Secondary | ICD-10-CM | POA: Diagnosis present

## 2019-08-07 DIAGNOSIS — F17210 Nicotine dependence, cigarettes, uncomplicated: Secondary | ICD-10-CM | POA: Diagnosis present

## 2019-08-07 DIAGNOSIS — I10 Essential (primary) hypertension: Secondary | ICD-10-CM | POA: Diagnosis present

## 2019-08-07 DIAGNOSIS — R200 Anesthesia of skin: Secondary | ICD-10-CM | POA: Diagnosis present

## 2019-08-07 DIAGNOSIS — Z981 Arthrodesis status: Secondary | ICD-10-CM | POA: Diagnosis not present

## 2019-08-07 DIAGNOSIS — Z8249 Family history of ischemic heart disease and other diseases of the circulatory system: Secondary | ICD-10-CM | POA: Diagnosis not present

## 2019-08-07 DIAGNOSIS — E876 Hypokalemia: Secondary | ICD-10-CM | POA: Diagnosis present

## 2019-08-07 LAB — HEPATIC FUNCTION PANEL
ALT: 50 U/L — ABNORMAL HIGH (ref 0–44)
AST: 48 U/L — ABNORMAL HIGH (ref 15–41)
Albumin: 4 g/dL (ref 3.5–5.0)
Alkaline Phosphatase: 161 U/L — ABNORMAL HIGH (ref 38–126)
Bilirubin, Direct: 0.2 mg/dL (ref 0.0–0.2)
Indirect Bilirubin: 0.9 mg/dL (ref 0.3–0.9)
Total Bilirubin: 1.1 mg/dL (ref 0.3–1.2)
Total Protein: 7.3 g/dL (ref 6.5–8.1)

## 2019-08-07 LAB — BASIC METABOLIC PANEL
Anion gap: 9 (ref 5–15)
BUN: 15 mg/dL (ref 8–23)
CO2: 27 mmol/L (ref 22–32)
Calcium: 9.3 mg/dL (ref 8.9–10.3)
Chloride: 103 mmol/L (ref 98–111)
Creatinine, Ser: 0.55 mg/dL — ABNORMAL LOW (ref 0.61–1.24)
GFR calc Af Amer: >60 mL/min (ref >60)
GFR calc non Af Amer: >60 mL/min (ref >60)
Glucose, Bld: 105 mg/dL — ABNORMAL HIGH (ref 70–99)
Potassium: 3.4 mmol/L — ABNORMAL LOW (ref 3.5–5.1)
Sodium: 139 mmol/L (ref 135–145)

## 2019-08-07 LAB — LIPID PANEL
Cholesterol: 129 mg/dL (ref 0–200)
HDL: 30 mg/dL — ABNORMAL LOW (ref >40)
LDL Cholesterol: 76 mg/dL (ref 0–99)
Total CHOL/HDL Ratio: 4.3 RATIO
Triglycerides: 116 mg/dL (ref <150)
VLDL: 23 mg/dL (ref 0–40)

## 2019-08-07 LAB — CBC
HCT: 44.5 % (ref 39.0–52.0)
Hemoglobin: 15.1 g/dL (ref 13.0–17.0)
MCH: 31.9 pg (ref 26.0–34.0)
MCHC: 33.9 g/dL (ref 30.0–36.0)
MCV: 94.1 fL (ref 80.0–100.0)
Platelets: 143 10*3/uL — ABNORMAL LOW (ref 150–400)
RBC: 4.73 MIL/uL (ref 4.22–5.81)
RDW: 12.3 % (ref 11.5–15.5)
WBC: 4.5 10*3/uL (ref 4.0–10.5)
nRBC: 0 % (ref 0.0–0.2)

## 2019-08-07 LAB — HIV ANTIBODY (ROUTINE TESTING W REFLEX): HIV Screen 4th Generation wRfx: NONREACTIVE

## 2019-08-07 MED ORDER — GABAPENTIN 100 MG PO CAPS
100.0000 mg | ORAL_CAPSULE | Freq: Two times a day (BID) | ORAL | Status: DC
  Administered 2019-08-07 – 2019-08-08 (×3): 100 mg via ORAL
  Filled 2019-08-07 (×3): qty 1

## 2019-08-07 MED ORDER — CYCLOBENZAPRINE HCL 10 MG PO TABS
5.0000 mg | ORAL_TABLET | Freq: Once | ORAL | Status: AC
  Administered 2019-08-07: 10:00:00 5 mg via ORAL
  Filled 2019-08-07: qty 1

## 2019-08-07 MED ORDER — NICOTINE 14 MG/24HR TD PT24
14.0000 mg | MEDICATED_PATCH | Freq: Every day | TRANSDERMAL | Status: DC
  Administered 2019-08-07: 14 mg via TRANSDERMAL
  Filled 2019-08-07 (×2): qty 1

## 2019-08-07 NOTE — Plan of Care (Signed)
Pt requested to be disconnected from telemetry.  He can't get any rest b/c of the machine beeping and he gets twisted in the leads.  Pt will be d/ced home tomorrow as soon as ECHO is done.

## 2019-08-07 NOTE — Progress Notes (Signed)
Patient ID: Chris Mcdonald, male   DOB: [birth year], 64 y.o.   MRN: [id number] [facility] PROGRESS NOTE  Chris Mcdonald [id number] DOB: Nov 18, 1955 DOA: 08/06/2019 PCP: Donnie Coffin, MD  HPI/Subjective: Patient still having numbness on the left side of his body that was worse than previous.  Patient has history of prior stroke with left-sided symptoms.  Patient always has some neck pain.  Had prior operation and fusion.  Objective: Vitals:   08/07/19 1000 08/07/19 1100  BP: 130/88 128/78  Pulse: 89 77  Resp:    Temp:    SpO2: 99% 99%   No intake or output data in the 24 hours ending 08/07/19 1605 Filed Weights   08/06/19 1559  Weight: 49.9 kg    ROS: Review of Systems  Constitutional: Negative for chills and fever.  Eyes: Negative for blurred vision.  Respiratory: Negative for cough and shortness of breath.   Cardiovascular: Negative for chest pain.  Gastrointestinal: Negative for abdominal pain, constipation, diarrhea, nausea and vomiting.  Genitourinary: Negative for dysuria.  Musculoskeletal: Positive for neck pain. Negative for joint pain.  Neurological: Positive for sensory change. Negative for dizziness and headaches.   Exam: Physical Exam  Constitutional: He is oriented to person, place, and time.  HENT:  Nose: No mucosal edema.  Mouth/Throat: No oropharyngeal exudate or posterior oropharyngeal edema.  Eyes: Conjunctivae and lids are normal.  Neck: Carotid bruit is not present.  Cardiovascular: S1 normal and S2 normal. Exam reveals no gallop.  No murmur heard. Pulses:      Dorsalis pedis pulses are 2+ on the right side and 2+ on the left side.  Respiratory: No respiratory distress. He has no wheezes. He has no rhonchi. He has no rales.  GI: Soft. Bowel sounds are normal. There is no abdominal tenderness.  Musculoskeletal:     Right ankle: No swelling.     Left ankle: No swelling.  Lymphadenopathy:    He has no cervical adenopathy.  Neurological: He  is alert and oriented to person, place, and time. No cranial nerve deficit.  Patient still has decreased sensation left side of his body.  Skin: Skin is warm. No rash noted. Nails show no clubbing.  Psychiatric: He has a normal mood and affect.      Data Reviewed: Basic Metabolic Panel: Recent Labs  Lab 08/06/19 1601 08/07/19 0453  NA 137 139  K 3.2* 3.4*  CL 100 103  CO2 27 27  GLUCOSE 92 105*  BUN 16 15  CREATININE 0.84 0.55*  CALCIUM 9.6 9.3   Liver Function Tests: Recent Labs  Lab 08/07/19 0453  AST 48*  ALT 50*  ALKPHOS 161*  BILITOT 1.1  PROT 7.3  ALBUMIN 4.0   CBC: Recent Labs  Lab 08/06/19 1601 08/07/19 0453  WBC 5.0  5.1 4.5  NEUTROABS 3.2  --   HGB 16.0  16.0 15.1  HCT 46.5  46.9 44.5  MCV 92.4  92.3 94.1  PLT 147*  147* 143*    CBG: Recent Labs  Lab 08/06/19 1601  GLUCAP 79    Recent Results (from the past 240 hour(s))  SARS CORONAVIRUS 2 (TAT 6-24 HRS) Nasopharyngeal Nasopharyngeal Swab     Status: None   Collection Time: 08/06/19  4:49 PM   Specimen: Nasopharyngeal Swab  Result Value Ref Range Status   SARS Coronavirus 2 NEGATIVE NEGATIVE Final    Comment: (NOTE) SARS-CoV-2 target nucleic acids are NOT DETECTED. The SARS-CoV-2 RNA is generally detectable in upper and lower  respiratory specimens during the acute phase of infection. Negative results do not preclude SARS-CoV-2 infection, do not rule out co-infections with other pathogens, and should not be used as the sole basis for treatment or other patient management decisions. Negative results must be combined with clinical observations, patient history, and epidemiological information. The expected result is Negative. Fact Sheet for Patients: SugarRoll.be Fact Sheet for Healthcare Providers: https://www.woods-mathews.com/ This test is not yet approved or cleared by the [place] FDA and  has been authorized for detection and/or  diagnosis of SARS-CoV-2 by FDA under an Emergency Use Authorization (EUA). This EUA will remain  in effect (meaning this test can be used) for the duration of the COVID-19 declaration under Section 56 4(b)(1) of the Act, 21 U.S.C. section 360bbb-3(b)(1), unless the authorization is terminated or revoked sooner. Performed at [facility], [place] [address] [place], [place] [address]      Studies: MR Brain W and Wo Contrast  Result Date: 08/06/2019 CLINICAL DATA:  Stroke, follow-up. Personal history of previous infarct with left lower extremity weakness. Patient experienced increased left-sided face, upper, and lower extremity numbness today. EXAM: MRI HEAD WITHOUT AND WITH CONTRAST TECHNIQUE: Multiplanar, multiecho pulse sequences of the brain and surrounding structures were obtained without and with intravenous contrast. CONTRAST:  7.78mL GADAVIST GADOBUTROL 1 MMOL/ML IV SOLN COMPARISON:  CT head without contrast 08/06/2019 FINDINGS: Brain: No acute infarct, hemorrhage, or mass lesion is present. The ventricles are of normal size. A remote lacunar infarct is present in the lateral right thalamus. No significant white matter disease is evident. No significant extraaxial fluid collection is present. The internal auditory canals are within normal limits. The brainstem and cerebellum are within normal limits. Postcontrast images demonstrate no pathologic enhancement. Vascular: Flow is present in the major intracranial arteries. Skull and upper cervical spine: The craniocervical junction is normal. Upper cervical spine is within normal limits. Marrow signal is unremarkable. Sinuses/Orbits: Chronic left maxillary sinus disease is again seen. Mild mucosal thickening is present left ethmoid air cells and frontal sinus. IMPRESSION: 1. No acute intracranial abnormality or significant interval change. 2. Remote lacunar infarct of the right thalamus. 3. Chronic left maxillary sinus disease. Electronically  Signed   By: San Morelle M.D.   On: 08/06/2019 22:53   MR CERVICAL SPINE WO CONTRAST  Result Date: 08/07/2019 CLINICAL DATA:  Acute neck pain. Left-sided numbness and weakness. EXAM: MRI CERVICAL SPINE WITHOUT CONTRAST TECHNIQUE: Multiplanar, multisequence MR imaging of the cervical spine was performed. No intravenous contrast was administered. COMPARISON:  None. FINDINGS: Alignment: Mild kyphotic angulation in the upper thorax at the T2-3 level due to mild inferior endplate chronic flattening at T2. 2 mm degenerative anterolisthesis at C7-T1. Vertebrae: Solid interbody fusion at C5-C6-C7 with acquired posterior intervertebral fusion at C3-4 and C4-5. Posterior decompression from C7 with posterolateral rod and facet screw fixation along the cervical spine connected to pedicle screws at T2 bilaterally and T1 on the right. Mild facet edema on the left at T2 for example on image 12/4 is likely degenerative. No other notable marrow edema is identified. Cord: No significant abnormal spinal cord signal is observed. Posterior Fossa, vertebral arteries, paraspinal tissues: Small remote lacunar infarct in the right thalamus. Disc levels: C2-3: No impingement. Mild uncinate and facet spurring on the left. C3-4: No impingement. Possible fusion of the right facet joint. C4-5: Mild left foraminal stenosis due to intervertebral and facet spurring. C5-6: No impingement.  Solid interbody fusion. C6-7: Mild bilateral foraminal stenosis due to  intervertebral and facet spurring. C7-T1: Borderline right foraminal stenosis due to facet spurring. T1-2: Unremarkable. T2-3: Moderate bilateral foraminal stenosis due to intervertebral and uncinate spurring. Disc bulge and intervertebral spurring thin the ventral subarachnoid space although there is adequate CSF space posterior to the cord at this level. IMPRESSION: 1. Spondylosis contributing to moderate impingement at T2-3 and mild impingement at C4-5 and C6-7, as detailed  above. 2. Mild kyphotic angulation at the T2-3 level due to chronic inferior endplate flattening at T2. 3. Solid interbody fusion at C5-C6-C7, with partial posterior interbody fusion at C3-4 and C4-5. Posterolateral hardware along the cervical spine extending to the T2 level. 4. Small remote right thalamic lacunar infarct. Electronically Signed   By: Van Clines M.D.   On: 08/07/2019 13:05   US Carotid Bilateral  Result Date: 08/07/2019 CLINICAL DATA:  64 year old male with a history of TIA EXAM: BILATERAL CAROTID DUPLEX ULTRASOUND TECHNIQUE: Pearline Cables scale imaging, color Doppler and duplex ultrasound were performed of bilateral carotid and vertebral arteries in the neck. COMPARISON:  None. FINDINGS: Criteria: Quantification of carotid stenosis is based on velocity parameters that correlate the residual internal carotid diameter with NASCET-based stenosis levels, using the diameter of the distal internal carotid lumen as the denominator for stenosis measurement. The following velocity measurements were obtained: RIGHT ICA:  Systolic 68 cm/sec, Diastolic 31 cm/sec CCA:  82 cm/sec SYSTOLIC ICA/CCA RATIO:  0.8 ECA:  79 cm/sec LEFT ICA:  Systolic 77 cm/sec, Diastolic 37 cm/sec CCA:  47 cm/sec SYSTOLIC ICA/CCA RATIO:  1.6 ECA:  59 cm/sec Right Brachial SBP: Not acquired Left Brachial SBP: Not acquired RIGHT CAROTID ARTERY: No significant calcifications of the right common carotid artery. Intermediate waveform maintained. Heterogeneous and partially calcified plaque at the right carotid bifurcation. Mild lumen shadowing. Low resistance waveform of the right ICA. No significant tortuosity. RIGHT VERTEBRAL ARTERY: Antegrade flow with low resistance waveform. LEFT CAROTID ARTERY: No significant calcifications of the left common carotid artery. Intermediate waveform maintained. Heterogeneous and partially calcified plaque at the left carotid bifurcation without significant lumen shadowing. Low resistance waveform of  the left ICA. Mild lumen shadowing no significant tortuosity. LEFT VERTEBRAL ARTERY:  Antegrade flow with low resistance waveform. IMPRESSION: Color duplex indicates minimal heterogeneous and calcified plaque, with no hemodynamically significant stenosis by duplex criteria in the extracranial cerebrovascular circulation. Signed, Dulcy Fanny. Dellia Nims, RPVI [facility] Electronically Signed   By: Corrie Mckusick D.O.   On: 08/07/2019 11:49   CT HEAD CODE STROKE WO CONTRAST  Result Date: 08/06/2019 CLINICAL DATA:  Code stroke. EXAM: CT HEAD WITHOUT CONTRAST TECHNIQUE: Contiguous axial images were obtained from the base of the skull through the vertex without intravenous contrast. COMPARISON:  None. FINDINGS: Brain: There is no acute intracranial hemorrhage, mass effect, or edema. Gray-white differentiation is preserved. There is a 4 mm small vessel infarct of the right thalamus. Ventricles and sulci are normal in size and configuration. There is no extra-axial fluid collection. Vascular: No definite hyperdense vessel, noting presence of streak artifact through the circle-of-Willis. There is intracranial atherosclerotic calcification at the skull base. Skull: Unremarkable. Sinuses/Orbits: Minimally imaged left maxillary sinus opacification. Orbits are unremarkable. Other: Mastoid air cells are clear. ASPECTS [facility] Stroke Program Early CT Score) - Ganglionic level infarction (caudate, lentiform nuclei, internal capsule, insula, M1-M3 cortex): 7 - Supraganglionic infarction (M4-M6 cortex): 3 Total score (0-10 with 10 being normal): 10 IMPRESSION: No acute intracranial hemorrhage. ASPECT score is 10. There is an age-indeterminate lacunar infarct  of the right thalamus. These results were called by telephone at the time of interpretation on 08/06/2019 at 4:24 pm to provider [facility] , who verbally acknowledged these results. Electronically Signed   By: Macy Mis M.D.   On: 08/06/2019 16:28    Scheduled Meds: . aspirin EC  81 mg Oral Daily  . atorvastatin  40 mg Oral q1800  . clopidogrel  75 mg Oral Daily  . enoxaparin (LOVENOX) injection  40 mg Subcutaneous Q24H  . fluticasone  2 spray Each Nare Daily  . gabapentin  100 mg Oral BID  . hydrochlorothiazide  12.5 mg Oral Daily  . Lenvatinib 4 mg daily dose  8 mg Oral Daily  . mupirocin ointment   Topical BID  . nicotine  14 mg Transdermal Daily  . pantoprazole  40 mg Oral Daily  . potassium chloride SA  20 mEq Oral Daily  . sodium chloride flush  3 mL Intravenous Once   Assessment/Plan:  1. TIA with persistent left-sided numbness.  The numbness is persistent.  MRI of the brain negative for stroke.  I got a stat MRI of the cervical spine which showed hardware but I do not think any of the symptoms would cause his numbness.  Carotid ultrasound negative.  I am still waiting for an echocardiogram which will complete the TIA work-up.  Patient on aspirin Plavix and Lipitor.  I added gabapentin low-dose. 2. Essential hypertension on hydrochlorothiazide 3. Liver cancer on Lenvima 4. Hypokalemia on potassium 5. GERD on PPI 6. Erythema in umbilicus on mupirocin ointment  Code Status:     Code Status Orders  (From admission, onward)         Start     Ordered   08/06/19 1730  Full code  Continuous     08/06/19 1730        Code Status History    Date Active Date Inactive Code Status Order ID Comments User Context   08/06/2019 1730 08/06/2019 1730 Full Code [id number]  Loletha Grayer, MD ED   Advance Care Planning Activity     Family Communication: Spoke with wife at the bedside and on the phone twice Disposition Plan: Awaiting echocardiogram to complete TIA work-up.  Would like to complete the work-up prior to disposition.  Consultants:  Telemetry neurology  Time spent: 30 minutes  [place]

## 2019-08-07 NOTE — Evaluation (Signed)
Physical Therapy Evaluation Patient Details Name: Chris Mcdonald MRN: [id number] DOB: 11/20/1955 Today's Date: 08/07/2019   History of Present Illness  Pt admitted for TIA with MRI negative for CVA. HIstory of CVA with residual L side weakness, HTN, liver ca, and arthritis.   Clinical Impression  Pt is a pleasant 64 year old male who was admitted for TIA. Pt demonstrates all bed mobility/transfers/ambulation at baseline level. Strength/sensation/coordination intact at baseline level. Pt does not require any further PT needs at this time. Pt will be dc in house and does not require follow up. RN aware. Will dc current orders.     Follow Up Recommendations No PT follow up    Equipment Recommendations  None recommended by PT    Recommendations for Other Services       Precautions / Restrictions Precautions Precautions: None Restrictions Weight Bearing Restrictions: No      Mobility  Bed Mobility Overal bed mobility: Independent             General bed mobility comments: safe technique with ability to easily transition to EOB  Transfers Overall transfer level: Independent Equipment used: None             General transfer comment: safe technique with upright posture  Ambulation/Gait Ambulation/Gait assistance: Independent Gait Distance (Feet): 300 Feet Assistive device: None Gait Pattern/deviations: WFL(Within Functional Limits)     General Gait Details: safe technique with reciprocal gait pattern  Stairs            Wheelchair Mobility    Modified Rankin (Stroke Patients Only)       Balance Overall balance assessment: Independent                                           Pertinent Vitals/Pain Pain Assessment: No/denies pain    Home Living Family/patient expects to be discharged to:: Private residence Living Arrangements: Spouse/significant other;Children(75 year old grandson) Available Help at Discharge: Family;Available  24 hours/day Type of Home: House Home Access: Stairs to enter Entrance Stairs-Rails: Can reach both Entrance Stairs-Number of Steps: 4 Home Layout: One level Home Equipment: None      Prior Function Level of Independence: Independent         Comments: active and reports no falls     Hand Dominance        Extremity/Trunk Assessment   Upper Extremity Assessment Upper Extremity Assessment: Overall WFL for tasks assessed    Lower Extremity Assessment Lower Extremity Assessment: Overall WFL for tasks assessed       Communication   Communication: No difficulties(soft spoken)  Cognition Arousal/Alertness: Awake/alert Behavior During Therapy: WFL for tasks assessed/performed Overall Cognitive Status: Within Functional Limits for tasks assessed                                        General Comments      Exercises     Assessment/Plan    PT Assessment Patent does not need any further PT services  PT Problem List         PT Treatment Interventions      PT Goals (Current goals can be found in the Care Plan section)  Acute Rehab PT Goals Patient Stated Goal: to go home PT Goal Formulation: All assessment and education complete, DC  therapy Time For Goal Achievement: 08/07/19 Potential to Achieve Goals: Good    Frequency     Barriers to discharge        Co-evaluation               AM-PAC PT "6 Clicks" Mobility  Outcome Measure Help needed turning from your back to your side while in a flat bed without using bedrails?: None Help needed moving from lying on your back to sitting on the side of a flat bed without using bedrails?: None Help needed moving to and from a bed to a chair (including a wheelchair)?: None Help needed standing up from a chair using your arms (e.g., wheelchair or bedside chair)?: None Help needed to walk in hospital room?: None Help needed climbing 3-5 steps with a railing? : None 6 Click Score: 24    End of  Session Equipment Utilized During Treatment: Gait belt Activity Tolerance: Patient tolerated treatment well Patient left: in bed Nurse Communication: Mobility status PT Visit Diagnosis: Muscle weakness (generalized) (M62.81)    Time: [id number] PT Time Calculation (min) (ACUTE ONLY): 11 min   Charges:   PT Evaluation $PT Eval Low Complexity: 1 Low          Greggory Stallion, PT, DPT [phone]   Chris Mcdonald 08/07/2019, 9:26 AM

## 2019-08-07 NOTE — Evaluation (Addendum)
Occupational Therapy Evaluation Patient Details Name: Chris Mcdonald MRN: [id number] DOB: 1955-12-15 Today's Date: 08/07/2019    History of Present Illness Pt. is a 64 y.o. male who was admitted to [facility] for TIA. Imaging was negative for  for CVA. PMHx includes: CVA with left sided weakness, and tingling, arthritis, Hepatitis, HTN, Liver CA, and Spinal Stenosis,   Clinical Impression   Pt. currently presents with 7/10 generalized body pain. Pt. resides at home with his wife/significant other. Pt. was independent with ADLs, and IADL functioning: including light meal preparation, and medication management. Pt. Was able to drive. Pt. is able to perform ADL care needs, and BUEs strength, coordination, and sensation are [facility]. Additional follow-up OT services are not warranted at this time. Pt. plans to return home with family assistance as needed. No further OT services are warranted at this time. Will discontinue the order.    Follow Up Recommendations  No OT follow up    Equipment Recommendations       Recommendations for Other Services       Precautions / Restrictions Precautions Precautions: None Restrictions Weight Bearing Restrictions: No      Mobility Bed Mobility Overal bed mobility: Independent             General bed mobility comments: safe technique with ability to easily transition to EOB  Transfers Overall transfer level: Independent Equipment used: None             General transfer comment: safe technique with upright posture    Balance Overall balance assessment: Independent                                         ADL either performed or assessed with clinical judgement   ADL Overall ADL's : Independent                                       General ADL Comments: Independent with ADLs, and IADLs, medication management     Vision Baseline Vision/History: Wears glasses Wears Glasses: Reading only        Perception     Praxis      Pertinent Vitals/Pain Pain Assessment: 0-10 Pain Score: 7  Pain Location: All over body pain. Pain Descriptors / Indicators: Aching Pain Intervention(s): Monitored during session;Limited activity within patient's tolerance(RN notified.)     Hand Dominance     Extremity/Trunk Assessment Upper Extremity Assessment Upper Extremity Assessment: Overall WFL for tasks assessed       Communication Communication Communication: No difficulties   Cognition Arousal/Alertness: Awake/alert Behavior During Therapy: WFL for tasks assessed/performed Overall Cognitive Status: Within Functional Limits for tasks assessed                                     General Comments       Exercises     Shoulder Instructions      Home Living Family/patient expects to be discharged to:: Private residence Living Arrangements: Spouse/significant other;Children Available Help at Discharge: Family;Available 24 hours/day Type of Home: House Home Access: Stairs to enter [employer] of Steps: 4 Entrance Stairs-Rails: Can reach both Home Layout: One level  Home Equipment: None          Prior Functioning/Environment Level of Independence: Independent        Comments: active and reports no falls        OT Problem List:        OT Treatment/Interventions:      OT Goals(Current goals can be found in the care plan section) Acute Rehab OT Goals Patient Stated Goal: To return home OT Goal Formulation: With patient Time For Goal Achievement: 08/20/19 Potential to Achieve Goals: Good  OT Frequency:     Barriers to D/C:            Co-evaluation              AM-PAC OT "6 Clicks" Daily Activity     Outcome Measure Help from another person eating meals?: None Help from another person taking care of personal grooming?: None Help from another person toileting, which includes using toliet, bedpan, or urinal?:  None Help from another person bathing (including washing, rinsing, drying)?: None Help from another person to put on and taking off regular upper body clothing?: None Help from another person to put on and taking off regular lower body clothing?: None 6 Click Score: 24   End of Session Equipment Utilized During Treatment: Gait belt  Activity Tolerance: Patient tolerated treatment well Patient left: in bed;with call bell/phone within reach;with nursing/sitter in room;with family/visitor present  OT Visit Diagnosis: Muscle weakness (generalized) (M62.81)                Time: [id number] OT Time Calculation (min): 15 min Charges:  OT General Charges $OT Visit: 1 Visit OT Evaluation $OT Eval Low Complexity: 1 Low   Harrel Carina, MS, OTR/L  Harrel Carina 08/07/2019, 11:28 AM

## 2019-08-08 ENCOUNTER — Inpatient Hospital Stay
Admit: 2019-08-08 | Discharge: 2019-08-08 | Disposition: A | Discharge status: Home / Self Care | Payer: BC Managed Care – PPO | Attending: Internal Medicine | Admitting: Internal Medicine

## 2019-08-08 DIAGNOSIS — R200 Anesthesia of skin: Secondary | ICD-10-CM | POA: Diagnosis not present

## 2019-08-08 DIAGNOSIS — E876 Hypokalemia: Secondary | ICD-10-CM | POA: Diagnosis not present

## 2019-08-08 DIAGNOSIS — K219 Gastro-esophageal reflux disease without esophagitis: Secondary | ICD-10-CM | POA: Diagnosis not present

## 2019-08-08 DIAGNOSIS — I10 Essential (primary) hypertension: Secondary | ICD-10-CM | POA: Diagnosis not present

## 2019-08-08 DIAGNOSIS — C229 Malignant neoplasm of liver, not specified as primary or secondary: Secondary | ICD-10-CM | POA: Diagnosis not present

## 2019-08-08 LAB — ECHOCARDIOGRAM COMPLETE
Height: 67 in
Weight: 1760 oz

## 2019-08-08 MED ORDER — ATORVASTATIN CALCIUM 40 MG PO TABS: 40.0000 mg | ORAL_TABLET | Freq: Every day | ORAL | 0 refills | Status: DC

## 2019-08-08 MED ORDER — GABAPENTIN 100 MG PO CAPS: 100.0000 mg | ORAL_CAPSULE | Freq: Two times a day (BID) | ORAL | 0 refills | Status: AC

## 2019-08-08 MED ORDER — CLOPIDOGREL BISULFATE 75 MG PO TABS: 75.0000 mg | ORAL_TABLET | Freq: Every day | ORAL | 0 refills | Status: DC

## 2019-08-08 MED ORDER — NICOTINE 14 MG/24HR TD PT24: MEDICATED_PATCH | TRANSDERMAL | 0 refills | Status: DC

## 2019-08-08 MED ORDER — MUPIROCIN 2 % EX OINT: TOPICAL_OINTMENT | CUTANEOUS | 0 refills | Status: DC

## 2019-08-08 NOTE — Consult Note (Signed)
Reason for Consult: numbness bilaterally  Requesting Physician: Dr. Leslye Peer   CC: numbness    HPI: Chris Mcdonald is an 64 y.o. male  with a known history of stroke with left-sided numbness and weakness.  Presents with worsening numbness L >R.    He always has a little baseline weakness on his left leg anyway.  He does not walk with any devices.  He feels that his left arm is strong.  He does have a little headache.  MRI no acute abnormalities   Past Medical History:  Diagnosis Date  . Arthritis   . Hepatitis   . Hypertension   . Liver cancer ([place])   . Spinal stenosis   . Stroke [facility])     Past Surgical History:  Procedure Laterality Date  . COLONOSCOPY WITH PROPOFOL N/A 02/21/2015   Procedure: COLONOSCOPY WITH PROPOFOL;  Surgeon: Lollie Sails, MD;  Location: [facility] ENDOSCOPY;  Service: Endoscopy;  Laterality: N/A;  . INGUINAL HERNIA REPAIR Bilateral   . IR RADIOLOGIST EVAL & MGMT  08/04/2018  . POSTERIOR CERVICAL VERTEBRAE EXCISION      Family History  Problem Relation Age of Onset  . Cancer Brother   . Heart disease Brother   . Cancer Mother     Social History:  reports that he has been smoking cigarettes. He has a 75.00 pack-year smoking history. He has never used smokeless tobacco. He reports that he does not drink alcohol or use drugs.  No Known Allergies  Medications: I have reviewed the patient's current medications.   Physical Examination: Blood pressure (!) 120/95, pulse 79, temperature 98.1 F (36.7 C), temperature source Oral, resp. rate 18, height 5\' 7"  (1.702 m), weight 49.9 kg, SpO2 99 %.  Neurological Examination   Mental Status: Alert, oriented, thought content appropriate.  Speech fluent without evidence of aphasia.  Able to follow 3 step commands without difficulty. Cranial Nerves: II: Discs flat bilaterally; Horse voice  III,IV, VI: ptosis not present, extra-ocular motions intact bilaterally V,VII: smile symmetric, facial light touch sensation  normal bilaterally VIII: hearing normal bilaterally IX,X: gag reflex present XI: bilateral shoulder shrug XII: midline tongue extension Motor: L sided weakness that is chronic LLE>LUE Tone and bulk:normal tone throughout; no atrophy noted Sensory: states decreased L side more then right  Deep Tendon Reflexes: 1+ and symmetric throughout       Laboratory Studies:   Basic Metabolic Panel: Recent Labs  Lab 08/06/19 1601 08/07/19 0453  NA 137 139  K 3.2* 3.4*  CL 100 103  CO2 27 27  GLUCOSE 92 105*  BUN 16 15  CREATININE 0.84 0.55*  CALCIUM 9.6 9.3    Liver Function Tests: Recent Labs  Lab 08/07/19 0453  AST 48*  ALT 50*  ALKPHOS 161*  BILITOT 1.1  PROT 7.3  ALBUMIN 4.0   No results for input(s): LIPASE, AMYLASE in the last 168 hours. No results for input(s): AMMONIA in the last 168 hours.  CBC: Recent Labs  Lab 08/06/19 1601 08/07/19 0453  WBC 5.0  5.1 4.5  NEUTROABS 3.2  --   HGB 16.0  16.0 15.1  HCT 46.5  46.9 44.5  MCV 92.4  92.3 94.1  PLT 147*  147* 143*    Cardiac Enzymes: No results for input(s): CKTOTAL, CKMB, CKMBINDEX, TROPONINI in the last 168 hours.  BNP: Invalid input(s): POCBNP  CBG: Recent Labs  Lab 08/06/19 [id number]    Microbiology: Results for orders placed or performed during the hospital encounter  of 08/06/19  SARS CORONAVIRUS 2 (TAT 6-24 HRS) Nasopharyngeal Nasopharyngeal Swab     Status: None   Collection Time: 08/06/19  4:49 PM   Specimen: Nasopharyngeal Swab  Result Value Ref Range Status   SARS Coronavirus 2 NEGATIVE NEGATIVE Final    Comment: (NOTE) SARS-CoV-2 target nucleic acids are NOT DETECTED. The SARS-CoV-2 RNA is generally detectable in upper and lower respiratory specimens during the acute phase of infection. Negative results do not preclude SARS-CoV-2 infection, do not rule out co-infections with other pathogens, and should not be used as the sole basis for treatment or other patient  management decisions. Negative results must be combined with clinical observations, patient history, and epidemiological information. The expected result is Negative. Fact Sheet for Patients: SugarRoll.be Fact Sheet for Healthcare Providers: https://www.woods-mathews.com/ This test is not yet approved or cleared by the [place] FDA and  has been authorized for detection and/or diagnosis of SARS-CoV-2 by FDA under an Emergency Use Authorization (EUA). This EUA will remain  in effect (meaning this test can be used) for the duration of the COVID-19 declaration under Section 56 4(b)(1) of the Act, 21 U.S.C. section 360bbb-3(b)(1), unless the authorization is terminated or revoked sooner. Performed at [facility], [place] [address], [place], [place] [address]     Coagulation Studies: Recent Labs    08/06/19 1643  LABPROT 13.3  INR 1.0    Urinalysis: No results for input(s): COLORURINE, LABSPEC, PHURINE, GLUCOSEU, HGBUR, BILIRUBINUR, KETONESUR, PROTEINUR, UROBILINOGEN, NITRITE, LEUKOCYTESUR in the last 168 hours.  Invalid input(s): APPERANCEUR  Lipid Panel:     Component Value Date/Time   CHOL 129 08/07/2019 0453   TRIG 116 08/07/2019 0453   HDL 30 (L) 08/07/2019 0453   CHOLHDL 4.3 08/07/2019 0453   VLDL 23 08/07/2019 0453   LDLCALC 76 08/07/2019 0453    HgbA1C:  Lab Results  Component Value Date   HGBA1C 5.2 08/06/2019    Urine Drug Screen:  No results found for: LABOPIA, COCAINSCRNUR, LABBENZ, AMPHETMU, THCU, LABBARB  Alcohol Level: No results for input(s): ETH in the last 168 hours.  Other results: EKG: normal EKG, normal sinus rhythm, unchanged from previous tracings.  Imaging: MR Brain W and Wo Contrast  Result Date: 08/06/2019 CLINICAL DATA:  Stroke, follow-up. Personal history of previous infarct with left lower extremity weakness. Patient experienced increased left-sided face, upper, and lower extremity  numbness today. EXAM: MRI HEAD WITHOUT AND WITH CONTRAST TECHNIQUE: Multiplanar, multiecho pulse sequences of the brain and surrounding structures were obtained without and with intravenous contrast. CONTRAST:  7.31mL GADAVIST GADOBUTROL 1 MMOL/ML IV SOLN COMPARISON:  CT head without contrast 08/06/2019 FINDINGS: Brain: No acute infarct, hemorrhage, or mass lesion is present. The ventricles are of normal size. A remote lacunar infarct is present in the lateral right thalamus. No significant white matter disease is evident. No significant extraaxial fluid collection is present. The internal auditory canals are within normal limits. The brainstem and cerebellum are within normal limits. Postcontrast images demonstrate no pathologic enhancement. Vascular: Flow is present in the major intracranial arteries. Skull and upper cervical spine: The craniocervical junction is normal. Upper cervical spine is within normal limits. Marrow signal is unremarkable. Sinuses/Orbits: Chronic left maxillary sinus disease is again seen. Mild mucosal thickening is present left ethmoid air cells and frontal sinus. IMPRESSION: 1. No acute intracranial abnormality or significant interval change. 2. Remote lacunar infarct of the right thalamus. 3. Chronic left maxillary sinus disease. Electronically Signed   By: San Morelle  M.D.   On: 08/06/2019 22:53   MR CERVICAL SPINE WO CONTRAST  Result Date: 08/07/2019 CLINICAL DATA:  Acute neck pain. Left-sided numbness and weakness. EXAM: MRI CERVICAL SPINE WITHOUT CONTRAST TECHNIQUE: Multiplanar, multisequence MR imaging of the cervical spine was performed. No intravenous contrast was administered. COMPARISON:  None. FINDINGS: Alignment: Mild kyphotic angulation in the upper thorax at the T2-3 level due to mild inferior endplate chronic flattening at T2. 2 mm degenerative anterolisthesis at C7-T1. Vertebrae: Solid interbody fusion at C5-C6-C7 with acquired posterior intervertebral fusion  at C3-4 and C4-5. Posterior decompression from C7 with posterolateral rod and facet screw fixation along the cervical spine connected to pedicle screws at T2 bilaterally and T1 on the right. Mild facet edema on the left at T2 for example on image 12/4 is likely degenerative. No other notable marrow edema is identified. Cord: No significant abnormal spinal cord signal is observed. Posterior Fossa, vertebral arteries, paraspinal tissues: Small remote lacunar infarct in the right thalamus. Disc levels: C2-3: No impingement. Mild uncinate and facet spurring on the left. C3-4: No impingement. Possible fusion of the right facet joint. C4-5: Mild left foraminal stenosis due to intervertebral and facet spurring. C5-6: No impingement.  Solid interbody fusion. C6-7: Mild bilateral foraminal stenosis due to intervertebral and facet spurring. C7-T1: Borderline right foraminal stenosis due to facet spurring. T1-2: Unremarkable. T2-3: Moderate bilateral foraminal stenosis due to intervertebral and uncinate spurring. Disc bulge and intervertebral spurring thin the ventral subarachnoid space although there is adequate CSF space posterior to the cord at this level. IMPRESSION: 1. Spondylosis contributing to moderate impingement at T2-3 and mild impingement at C4-5 and C6-7, as detailed above. 2. Mild kyphotic angulation at the T2-3 level due to chronic inferior endplate flattening at T2. 3. Solid interbody fusion at C5-C6-C7, with partial posterior interbody fusion at C3-4 and C4-5. Posterolateral hardware along the cervical spine extending to the T2 level. 4. Small remote right thalamic lacunar infarct. Electronically Signed   By: Van Clines M.D.   On: 08/07/2019 13:05   US Carotid Bilateral  Result Date: 08/07/2019 CLINICAL DATA:  64 year old male with a history of TIA EXAM: BILATERAL CAROTID DUPLEX ULTRASOUND TECHNIQUE: Pearline Cables scale imaging, color Doppler and duplex ultrasound were performed of bilateral carotid and  vertebral arteries in the neck. COMPARISON:  None. FINDINGS: Criteria: Quantification of carotid stenosis is based on velocity parameters that correlate the residual internal carotid diameter with NASCET-based stenosis levels, using the diameter of the distal internal carotid lumen as the denominator for stenosis measurement. The following velocity measurements were obtained: RIGHT ICA:  Systolic 68 cm/sec, Diastolic 31 cm/sec CCA:  82 cm/sec SYSTOLIC ICA/CCA RATIO:  0.8 ECA:  79 cm/sec LEFT ICA:  Systolic 77 cm/sec, Diastolic 37 cm/sec CCA:  47 cm/sec SYSTOLIC ICA/CCA RATIO:  1.6 ECA:  59 cm/sec Right Brachial SBP: Not acquired Left Brachial SBP: Not acquired RIGHT CAROTID ARTERY: No significant calcifications of the right common carotid artery. Intermediate waveform maintained. Heterogeneous and partially calcified plaque at the right carotid bifurcation. Mild lumen shadowing. Low resistance waveform of the right ICA. No significant tortuosity. RIGHT VERTEBRAL ARTERY: Antegrade flow with low resistance waveform. LEFT CAROTID ARTERY: No significant calcifications of the left common carotid artery. Intermediate waveform maintained. Heterogeneous and partially calcified plaque at the left carotid bifurcation without significant lumen shadowing. Low resistance waveform of the left ICA. Mild lumen shadowing no significant tortuosity. LEFT VERTEBRAL ARTERY:  Antegrade flow with low resistance waveform. IMPRESSION: Color duplex indicates minimal heterogeneous and calcified  plaque, with no hemodynamically significant stenosis by duplex criteria in the extracranial cerebrovascular circulation. Signed, Dulcy Fanny. Dellia Nims, RPVI [facility] Electronically Signed   By: Corrie Mckusick D.O.   On: 08/07/2019 11:49   ECHOCARDIOGRAM COMPLETE  Result Date: 08/08/2019    ECHOCARDIOGRAM REPORT   Patient Name:   KEYVON Moye Date of Exam: 08/08/2019 Medical Rec #:   [id number]      Height:       67.0 in Accession #:    [id number]     Weight:       110.0 lb Date of Birth:  1955/07/23      BSA:          1.569 m Patient Age:    79 years       BP:           120/95 mmHg Patient Gender: M              HR:           79 bpm. Exam Location:  [facility] Procedure: 2D Echo Indications:     Stroke  History:         Patient has no prior history of Echocardiogram examinations.                  Stroke; Risk Factors:Hypertension.  Sonographer:     Raliegh Ip Thornton-Maynard Referring Phys:  [id number] Loletha Grayer Diagnosing Phys: Yolonda Kida MD  Sonographer Comments: No parasternal window. Image acquisition challenging due to patient body habitus. IMPRESSIONS  1. Left ventricular ejection fraction, by estimation, is 55 to 60%. The left ventricle has normal function. The left ventricle has no regional wall motion abnormalities. Left ventricular diastolic parameters were normal.  2. Right ventricular systolic function is normal. The right ventricular size is normal.  3. The mitral valve is normal in structure. No evidence of mitral valve regurgitation.  4. The aortic valve is grossly normal. Aortic valve regurgitation is not visualized. Mild aortic valve sclerosis is present, with no evidence of aortic valve stenosis. FINDINGS  Left Ventricle: Left ventricular ejection fraction, by estimation, is 55 to 60%. The left ventricle has normal function. The left ventricle has no regional wall motion abnormalities. The left ventricular internal cavity size was normal in size. There is  no left ventricular hypertrophy. Left ventricular diastolic parameters were normal. Right Ventricle: The right ventricular size is normal. No increase in right ventricular wall thickness. Right ventricular systolic function is normal. Left Atrium: Left atrial size was normal in size. Right Atrium: Right atrial size was normal in size. Pericardium: There is no evidence of pericardial effusion. Mitral Valve: The mitral valve is  normal in structure. No evidence of mitral valve regurgitation. Tricuspid Valve: The tricuspid valve is normal in structure. Tricuspid valve regurgitation is trivial. Aortic Valve: The aortic valve is grossly normal. Aortic valve regurgitation is not visualized. Mild aortic valve sclerosis is present, with no evidence of aortic valve stenosis. Aortic valve mean gradient measures 2.0 mmHg. Aortic valve peak gradient measures 4.1 mmHg. Aortic valve area, by VTI measures 2.83 cm. Pulmonic Valve: The pulmonic valve was normal in structure. Pulmonic valve regurgitation is not visualized. Aorta: The aortic root is normal in size and structure. IAS/Shunts: No atrial level shunt detected by color flow Doppler.  LEFT VENTRICLE PLAX 2D LVIDd:         3.53 cm     Diastology LVIDs:  2.64 cm     LV e' lateral:   8.81 cm/s LV PW:         0.92 cm     LV E/e' lateral: 5.7 LV IVS:        0.84 cm     LV e' medial:    8.16 cm/s LVOT diam:     2.20 cm     LV E/e' medial:  6.1 LV SV:         44 LV SV Index:   28 LVOT Area:     3.80 cm  LV Volumes (MOD) LV vol d, MOD A2C: 48.9 ml LV vol d, MOD A4C: 36.1 ml LV vol s, MOD A2C: 19.8 ml LV vol s, MOD A4C: 14.5 ml LV SV MOD A2C:     29.1 ml LV SV MOD A4C:     36.1 ml LV SV MOD BP:      24.7 ml RIGHT VENTRICLE RV S prime:     9.90 cm/s TAPSE (M-mode): 1.7 cm LEFT ATRIUM         Index LA diam:    2.50 cm 1.59 cm/m  AORTIC VALVE AV Area (Vmax):    2.36 cm AV Area (Vmean):   2.57 cm AV Area (VTI):     2.83 cm AV Vmax:           101.00 cm/s AV Vmean:          59.800 cm/s AV VTI:            0.157 m AV Peak Grad:      4.1 mmHg AV Mean Grad:      2.0 mmHg LVOT Vmax:         62.80 cm/s LVOT Vmean:        40.400 cm/s LVOT VTI:          0.117 m LVOT/AV VTI ratio: 0.75 MITRAL VALVE MV Area (PHT): 1.71 cm    SHUNTS MV E velocity: 50.00 cm/s  Systemic VTI:  0.12 m MV A velocity: 43.70 cm/s  Systemic Diam: 2.20 cm MV E/A ratio:  1.14 Dwayne D Callwood MD Electronically signed by Yolonda Kida MD Signature Date/Time: 08/08/2019/11:53:14 AM    Final    CT HEAD CODE STROKE WO CONTRAST  Result Date: 08/06/2019 CLINICAL DATA:  Code stroke. EXAM: CT HEAD WITHOUT CONTRAST TECHNIQUE: Contiguous axial images were obtained from the base of the skull through the vertex without intravenous contrast. COMPARISON:  None. FINDINGS: Brain: There is no acute intracranial hemorrhage, mass effect, or edema. Gray-white differentiation is preserved. There is a 4 mm small vessel infarct of the right thalamus. Ventricles and sulci are normal in size and configuration. There is no extra-axial fluid collection. Vascular: No definite hyperdense vessel, noting presence of streak artifact through the circle-of-Willis. There is intracranial atherosclerotic calcification at the skull base. Skull: Unremarkable. Sinuses/Orbits: Minimally imaged left maxillary sinus opacification. Orbits are unremarkable. Other: Mastoid air cells are clear. ASPECTS [facility] Stroke Program Early CT Score) - Ganglionic level infarction (caudate, lentiform nuclei, internal capsule, insula, M1-M3 cortex): 7 - Supraganglionic infarction (M4-M6 cortex): 3 Total score (0-10 with 10 being normal): 10 IMPRESSION: No acute intracranial hemorrhage. ASPECT score is 10. There is an age-indeterminate lacunar infarct of the right thalamus. These results were called by telephone at the time of interpretation on 08/06/2019 at 4:24 pm to provider [facility] , who verbally acknowledged these results. Electronically Signed   By: Macy Mis M.D.   On:  08/06/2019 16:28     Assessment/Plan:   64 y.o. male  with a known history of stroke with left-sided numbness and weakness.  Presents with worsening numbness L >R.    He always has a little baseline weakness on his left leg anyway.  He does not walk with any devices.  He feels that his left arm is strong.  He does have a little headache.  MRI no acute abnormalities   - On questioning today pt states he  has bilateral numbness but L side more then R  - some of the numbness on L side is chronic from old ischemia - MRI C spine reviewed with chronic changes and bulges but CSF flow present.  - needs out patient follow possible neurosurgery or neurology for EMG to look for radiculopathy for EMG testing. - safe to d/c from neurological stand point   08/08/2019, 12:16 PM

## 2019-08-08 NOTE — Progress Notes (Signed)
[place] at [place] was admitted to the Hospital on 08/06/2019 and Discharged  08/08/2019 and should be excused from work/school   for 4 days starting 08/06/2019 , may return to work/school without any restrictions.  Loletha Grayer M.D on 08/08/2019,at 11:06 AM  [facility] [place] [facility]

## 2019-08-08 NOTE — Discharge Instructions (Signed)
Can stop aspirin in 21 days and just use Plavix alone

## 2019-08-08 NOTE — Discharge Summary (Signed)
Chris Mcdonald NAME: Chris Mcdonald    MR#:  [id number]  DATE OF BIRTH:  [birth year]  DATE OF ADMISSION:  08/06/2019 ADMITTING PHYSICIAN: Loletha Grayer, MD  DATE OF DISCHARGE: 08/08/2019  1:08 PM  PRIMARY CARE PHYSICIAN: Donnie Coffin, MD    ADMISSION DIAGNOSIS:  TIA (transient ischemic attack) [G45.9] Numbness [R20.0] Cerebrovascular accident (CVA), unspecified mechanism ([place]) [I63.9]  DISCHARGE DIAGNOSIS:  Active Problems:   TIA (transient ischemic attack)   Left sided numbness   SECONDARY DIAGNOSIS:   Past Medical History:  Diagnosis Date  . Arthritis   . Hepatitis   . Hypertension   . Liver cancer ([place])   . Spinal stenosis   . Stroke Chris [facility])     [facility] COURSE:   1.  TIA with persistent left-sided numbness.  MRI of the brain was negative for stroke.  Carotid ultrasound and echocardiogram also normal.  The patient takes aspirin at at home.  I added Plavix and Lipitor.  Need to watch liver function tests closely being started on Lipitor.  Can drop off aspirin in 21 days.  Because the patient's history of neck issues I did get a cervical spine MRI.  I added low-dose gabapentin.  The patient still has numbness.  Neurology did recommend following up with either neurology and/or neurosurgery for EMG testing. 2.  Essential hypertension on hydrochlorothiazide 3.  Liver cancer on Lenvima 4.  Hypokalemia on potassium 5.  GERD on PPI 6.  Erythema in the umbilicus.  I prescribed mupirocin ointment.  DISCHARGE CONDITIONS:   Satisfactory  CONSULTS OBTAINED:  Treatment Team:  Leotis Pain, MD  DRUG ALLERGIES:  No Known Allergies  DISCHARGE MEDICATIONS:   Allergies as of 08/08/2019   No Known Allergies     Medication List    STOP taking these medications   amLODipine 5 MG tablet Commonly known as: NORVASC     TAKE these medications   aspirin EC 81 MG tablet Take 81 mg by mouth daily.   atorvastatin 40 MG  tablet Commonly known as: LIPITOR Take 1 tablet (40 mg total) by mouth daily at 6 PM.   clopidogrel 75 MG tablet Commonly known as: PLAVIX Take 1 tablet (75 mg total) by mouth daily.   fluticasone 50 MCG/ACT nasal spray Commonly known as: FLONASE 1 spray by Each Nare route daily.   gabapentin 100 MG capsule Commonly known as: NEURONTIN Take 1 capsule (100 mg total) by mouth 2 (two) times daily.   hydrochlorothiazide 12.5 MG tablet Commonly known as: HYDRODIURIL Take 25 mg by mouth daily.   Lenvima (4 MG Daily Dose) capsule Generic drug: Lenvatinib 4 mg daily dose Take 1 capsule (4 mg total) by mouth daily.   mupirocin ointment 2 % Commonly known as: BACTROBAN Apply twice a day to umbilical area   nicotine 14 mg/24hr patch Commonly known as: NICODERM CQ - dosed in mg/24 hours aply one patch chest wall daily (can substitute generic patch)   omeprazole 20 MG capsule Commonly known as: PRILOSEC TAKE 1 CAPSULE BY MOUTH EVERY DAY   ondansetron 8 MG tablet Commonly known as: Zofran Take 1 tablet (8 mg total) by mouth every 8 (eight) hours as needed for nausea or vomiting.   potassium chloride SA 20 MEQ tablet Commonly known as: Klor-Con M20 TAKE 1 TABLET (20 MEQ TOTAL) BY MOUTH DAILY. AS DIRECTED BY MD        DISCHARGE INSTRUCTIONS:   Follow-up PMD 5 days  If  you experience worsening of your admission symptoms, develop shortness of breath, life threatening emergency, suicidal or homicidal thoughts you must seek medical attention immediately by calling 911 or calling your MD immediately  if symptoms less severe.  You Must read complete instructions/literature along with all the possible adverse reactions/side effects for all the Medicines you take and that have been prescribed to you. Take any new Medicines after you have completely understood and accept all the possible adverse reactions/side effects.   Please note  You were cared for by a hospitalist during your  [facility] stay. If you have any questions about your discharge medications or the care you received while you were in the [facility] after you are discharged, you can call the unit and asked to speak with the hospitalist on call if the hospitalist that took care of you is not available. Once you are discharged, your primary care physician will handle any further medical issues. Please note that NO REFILLS for any discharge medications will be authorized once you are discharged, as it is imperative that you return to your primary care physician (or establish a relationship with a primary care physician if you do not have one) for your aftercare needs so that they can reassess your need for medications and monitor your lab values.    Today   CHIEF COMPLAINT:   Chief Complaint  Patient presents with  . Numbness    HISTORY OF PRESENT ILLNESS:  [name]  is a 64 y.o. male came in with numbness of his left face, arm and leg   VITAL SIGNS:  Blood pressure (!) 120/95, pulse 79, temperature 98.1 F (36.7 C), temperature source Oral, resp. rate 18, height 5\' 7"  (1.702 m), weight 49.9 kg, SpO2 99 %.   PHYSICAL EXAMINATION:  GENERAL:  64 y.o.-year-old patient lying in the bed with no acute distress.  EYES: Pupils equal, round, reactive to light and accommodation. No scleral icterus. Extraocular muscles intact.  HEENT: Head atraumatic, normocephalic. Oropharynx and nasopharynx clear.   LUNGS: Normal breath sounds bilaterally, no wheezing, rales,rhonchi or crepitation. No use of accessory muscles of respiration.  CARDIOVASCULAR: S1, S2 normal. No murmurs, rubs, or gallops.  ABDOMEN: Soft, non-tender, non-distended. Bowel sounds present. No organomegaly or mass.  EXTREMITIES: No pedal edema, cyanosis, or clubbing.  NEUROLOGIC: Cranial nerves II through XII are intact. Muscle strength 5/5 in all extremities. Sensation decreased left side to light touch. Gait not checked.  PSYCHIATRIC: The patient  is alert and oriented x 3.  SKIN: No obvious rash, lesion, or ulcer.   DATA REVIEW:   CBC Recent Labs  Lab 08/07/19 0453  WBC 4.5  HGB 15.1  HCT 44.5  PLT 143*    Chemistries  Recent Labs  Lab 08/07/19 0453  NA 139  K 3.4*  CL 103  CO2 27  GLUCOSE 105*  BUN 15  CREATININE 0.55*  CALCIUM 9.3  AST 48*  ALT 50*  ALKPHOS 161*  BILITOT 1.1    Microbiology Results  Results for orders placed or performed during the [facility] encounter of 08/06/19  SARS CORONAVIRUS 2 (TAT 6-24 HRS) Nasopharyngeal Nasopharyngeal Swab     Status: None   Collection Time: 08/06/19  4:49 PM   Specimen: Nasopharyngeal Swab  Result Value Ref Range Status   SARS Coronavirus 2 NEGATIVE NEGATIVE Final    Comment: (NOTE) SARS-CoV-2 target nucleic acids are NOT DETECTED. The SARS-CoV-2 RNA is generally detectable in upper and lower respiratory specimens during the acute phase of infection.  Negative results do not preclude SARS-CoV-2 infection, do not rule out co-infections with other pathogens, and should not be used as the sole basis for treatment or other patient management decisions. Negative results must be combined with clinical observations, patient history, and epidemiological information. The expected result is Negative. Fact Sheet for Patients: SugarRoll.be Fact Sheet for Healthcare Providers: https://www.woods-mathews.com/ This test is not yet approved or cleared by the [place] FDA and  has been authorized for detection and/or diagnosis of SARS-CoV-2 by FDA under an Emergency Use Authorization (EUA). This EUA will remain  in effect (meaning this test can be used) for the duration of the COVID-19 declaration under Section 56 4(b)(1) of the Act, 21 U.S.C. section 360bbb-3(b)(1), unless the authorization is terminated or revoked sooner. Performed at [facility], [place] [address], [place], [place] [address]     RADIOLOGY:  MR Brain  W and Wo Contrast  Result Date: 08/06/2019 CLINICAL DATA:  Stroke, follow-up. Personal history of previous infarct with left lower extremity weakness. Patient experienced increased left-sided face, upper, and lower extremity numbness today. EXAM: MRI HEAD WITHOUT AND WITH CONTRAST TECHNIQUE: Multiplanar, multiecho pulse sequences of the brain and surrounding structures were obtained without and with intravenous contrast. CONTRAST:  7.63mL GADAVIST GADOBUTROL 1 MMOL/ML IV SOLN COMPARISON:  CT head without contrast 08/06/2019 FINDINGS: Brain: No acute infarct, hemorrhage, or mass lesion is present. The ventricles are of normal size. A remote lacunar infarct is present in the lateral right thalamus. No significant white matter disease is evident. No significant extraaxial fluid collection is present. The internal auditory canals are within normal limits. The brainstem and cerebellum are within normal limits. Postcontrast images demonstrate no pathologic enhancement. Vascular: Flow is present in the major intracranial arteries. Skull and upper cervical spine: The craniocervical junction is normal. Upper cervical spine is within normal limits. Marrow signal is unremarkable. Sinuses/Orbits: Chronic left maxillary sinus disease is again seen. Mild mucosal thickening is present left ethmoid air cells and frontal sinus. IMPRESSION: 1. No acute intracranial abnormality or significant interval change. 2. Remote lacunar infarct of the right thalamus. 3. Chronic left maxillary sinus disease. Electronically Signed   By: San Morelle M.D.   On: 08/06/2019 22:53   MR CERVICAL SPINE WO CONTRAST  Result Date: 08/07/2019 CLINICAL DATA:  Acute neck pain. Left-sided numbness and weakness. EXAM: MRI CERVICAL SPINE WITHOUT CONTRAST TECHNIQUE: Multiplanar, multisequence MR imaging of the cervical spine was performed. No intravenous contrast was administered. COMPARISON:  None. FINDINGS: Alignment: Mild kyphotic angulation in  the upper thorax at the T2-3 level due to mild inferior endplate chronic flattening at T2. 2 mm degenerative anterolisthesis at C7-T1. Vertebrae: Solid interbody fusion at C5-C6-C7 with acquired posterior intervertebral fusion at C3-4 and C4-5. Posterior decompression from C7 with posterolateral rod and facet screw fixation along the cervical spine connected to pedicle screws at T2 bilaterally and T1 on the right. Mild facet edema on the left at T2 for example on image 12/4 is likely degenerative. No other notable marrow edema is identified. Cord: No significant abnormal spinal cord signal is observed. Posterior Fossa, vertebral arteries, paraspinal tissues: Small remote lacunar infarct in the right thalamus. Disc levels: C2-3: No impingement. Mild uncinate and facet spurring on the left. C3-4: No impingement. Possible fusion of the right facet joint. C4-5: Mild left foraminal stenosis due to intervertebral and facet spurring. C5-6: No impingement.  Solid interbody fusion. C6-7: Mild bilateral foraminal stenosis due to intervertebral and facet spurring. C7-T1: Borderline right foraminal  stenosis due to facet spurring. T1-2: Unremarkable. T2-3: Moderate bilateral foraminal stenosis due to intervertebral and uncinate spurring. Disc bulge and intervertebral spurring thin the ventral subarachnoid space although there is adequate CSF space posterior to the cord at this level. IMPRESSION: 1. Spondylosis contributing to moderate impingement at T2-3 and mild impingement at C4-5 and C6-7, as detailed above. 2. Mild kyphotic angulation at the T2-3 level due to chronic inferior endplate flattening at T2. 3. Solid interbody fusion at C5-C6-C7, with partial posterior interbody fusion at C3-4 and C4-5. Posterolateral hardware along the cervical spine extending to the T2 level. 4. Small remote right thalamic lacunar infarct. Electronically Signed   By: Van Clines M.D.   On: 08/07/2019 13:05   US Carotid  Bilateral  Result Date: 08/07/2019 CLINICAL DATA:  64 year old male with a history of TIA EXAM: BILATERAL CAROTID DUPLEX ULTRASOUND TECHNIQUE: Pearline Cables scale imaging, color Doppler and duplex ultrasound were performed of bilateral carotid and vertebral arteries in the neck. COMPARISON:  None. FINDINGS: Criteria: Quantification of carotid stenosis is based on velocity parameters that correlate the residual internal carotid diameter with NASCET-based stenosis levels, using the diameter of the distal internal carotid lumen as the denominator for stenosis measurement. The following velocity measurements were obtained: RIGHT ICA:  Systolic 68 cm/sec, Diastolic 31 cm/sec CCA:  82 cm/sec SYSTOLIC ICA/CCA RATIO:  0.8 ECA:  79 cm/sec LEFT ICA:  Systolic 77 cm/sec, Diastolic 37 cm/sec CCA:  47 cm/sec SYSTOLIC ICA/CCA RATIO:  1.6 ECA:  59 cm/sec Right Brachial SBP: Not acquired Left Brachial SBP: Not acquired RIGHT CAROTID ARTERY: No significant calcifications of the right common carotid artery. Intermediate waveform maintained. Heterogeneous and partially calcified plaque at the right carotid bifurcation. Mild lumen shadowing. Low resistance waveform of the right ICA. No significant tortuosity. RIGHT VERTEBRAL ARTERY: Antegrade flow with low resistance waveform. LEFT CAROTID ARTERY: No significant calcifications of the left common carotid artery. Intermediate waveform maintained. Heterogeneous and partially calcified plaque at the left carotid bifurcation without significant lumen shadowing. Low resistance waveform of the left ICA. Mild lumen shadowing no significant tortuosity. LEFT VERTEBRAL ARTERY:  Antegrade flow with low resistance waveform. IMPRESSION: Color duplex indicates minimal heterogeneous and calcified plaque, with no hemodynamically significant stenosis by duplex criteria in the extracranial cerebrovascular circulation. Signed, Dulcy Fanny. Dellia Nims, RPVI [facility]  Radiology Electronically Signed   By: Corrie Mckusick D.O.   On: 08/07/2019 11:49   ECHOCARDIOGRAM COMPLETE  Result Date: 08/08/2019    ECHOCARDIOGRAM REPORT   Patient Name:   JAMIAS Caton Date of Exam: 08/08/2019 Medical Rec #:  [id number]      Height:       67.0 in Accession #:    [id number]     Weight:       110.0 lb Date of Birth:  [birth year]      BSA:          1.569 m Patient Age:    23 years       BP:           120/95 mmHg Patient Gender: M              HR:           79 bpm. Exam Location:  [facility] Procedure: 2D Echo Indications:     Stroke  History:         Patient has no prior history of Echocardiogram examinations.  Stroke; Risk Factors:Hypertension.  Sonographer:     Raliegh Ip Thornton-Maynard Referring Phys:  [id number] Loletha Grayer Diagnosing Phys: Yolonda Kida MD  Sonographer Comments: No parasternal window. Image acquisition challenging due to patient body habitus. IMPRESSIONS  1. Left ventricular ejection fraction, by estimation, is 55 to 60%. The left ventricle has normal function. The left ventricle has no regional wall motion abnormalities. Left ventricular diastolic parameters were normal.  2. Right ventricular systolic function is normal. The right ventricular size is normal.  3. The mitral valve is normal in structure. No evidence of mitral valve regurgitation.  4. The aortic valve is grossly normal. Aortic valve regurgitation is not visualized. Mild aortic valve sclerosis is present, with no evidence of aortic valve stenosis. FINDINGS  Left Ventricle: Left ventricular ejection fraction, by estimation, is 55 to 60%. The left ventricle has normal function. The left ventricle has no regional wall motion abnormalities. The left ventricular internal cavity size was normal in size. There is  no left ventricular hypertrophy. Left ventricular diastolic parameters were normal. Right Ventricle: The right ventricular size is normal. No increase in right ventricular wall thickness. Right  ventricular systolic function is normal. Left Atrium: Left atrial size was normal in size. Right Atrium: Right atrial size was normal in size. Pericardium: There is no evidence of pericardial effusion. Mitral Valve: The mitral valve is normal in structure. No evidence of mitral valve regurgitation. Tricuspid Valve: The tricuspid valve is normal in structure. Tricuspid valve regurgitation is trivial. Aortic Valve: The aortic valve is grossly normal. Aortic valve regurgitation is not visualized. Mild aortic valve sclerosis is present, with no evidence of aortic valve stenosis. Aortic valve mean gradient measures 2.0 mmHg. Aortic valve peak gradient measures 4.1 mmHg. Aortic valve area, by VTI measures 2.83 cm. Pulmonic Valve: The pulmonic valve was normal in structure. Pulmonic valve regurgitation is not visualized. Aorta: The aortic root is normal in size and structure. IAS/Shunts: No atrial level shunt detected by color flow Doppler.  LEFT VENTRICLE PLAX 2D LVIDd:         3.53 cm     Diastology LVIDs:         2.64 cm     LV e' lateral:   8.81 cm/s LV PW:         0.92 cm     LV E/e' lateral: 5.7 LV IVS:        0.84 cm     LV e' medial:    8.16 cm/s LVOT diam:     2.20 cm     LV E/e' medial:  6.1 LV SV:         44 LV SV Index:   28 LVOT Area:     3.80 cm  LV Volumes (MOD) LV vol d, MOD A2C: 48.9 ml LV vol d, MOD A4C: 36.1 ml LV vol s, MOD A2C: 19.8 ml LV vol s, MOD A4C: 14.5 ml LV SV MOD A2C:     29.1 ml LV SV MOD A4C:     36.1 ml LV SV MOD BP:      24.7 ml RIGHT VENTRICLE RV S prime:     9.90 cm/s TAPSE (M-mode): 1.7 cm LEFT ATRIUM         Index LA diam:    2.50 cm 1.59 cm/m  AORTIC VALVE AV Area (Vmax):    2.36 cm AV Area (Vmean):   2.57 cm AV Area (VTI):     2.83 cm AV Vmax:  101.00 cm/s AV Vmean:          59.800 cm/s AV VTI:            0.157 m AV Peak Grad:      4.1 mmHg AV Mean Grad:      2.0 mmHg LVOT Vmax:         62.80 cm/s LVOT Vmean:        40.400 cm/s LVOT VTI:          0.117 m LVOT/AV VTI  ratio: 0.75 MITRAL VALVE MV Area (PHT): 1.71 cm    SHUNTS MV E velocity: 50.00 cm/s  Systemic VTI:  0.12 m MV A velocity: 43.70 cm/s  Systemic Diam: 2.20 cm MV E/A ratio:  1.14 Dwayne Prince [place] MD Electronically signed by Yolonda Kida MD Signature Date/Time: 08/08/2019/11:53:14 AM    Final    CT HEAD CODE STROKE WO CONTRAST  Result Date: 08/06/2019 CLINICAL DATA:  Code stroke. EXAM: CT HEAD WITHOUT CONTRAST TECHNIQUE: Contiguous axial images were obtained from the base of the skull through the vertex without intravenous contrast. COMPARISON:  None. FINDINGS: Brain: There is no acute intracranial hemorrhage, mass effect, or edema. Gray-white differentiation is preserved. There is a 4 mm small vessel infarct of the right thalamus. Ventricles and sulci are normal in size and configuration. There is no extra-axial fluid collection. Vascular: No definite hyperdense vessel, noting presence of streak artifact through the circle-of-Willis. There is intracranial atherosclerotic calcification at the skull base. Skull: Unremarkable. Sinuses/Orbits: Minimally imaged left maxillary sinus opacification. Orbits are unremarkable. Other: Mastoid air cells are clear. ASPECTS [facility] Stroke Program Early CT Score) - Ganglionic level infarction (caudate, lentiform nuclei, internal capsule, insula, M1-M3 cortex): 7 - Supraganglionic infarction (M4-M6 cortex): 3 Total score (0-10 with 10 being normal): 10 IMPRESSION: No acute intracranial hemorrhage. ASPECT score is 10. There is an age-indeterminate lacunar infarct of the right thalamus. These results were called by telephone at the time of interpretation on 08/06/2019 at 4:24 pm to provider [facility] , who verbally acknowledged these results. Electronically Signed   By: Macy Mis M.D.   On: 08/06/2019 16:28     Management plans discussed with the patient, family and they are in agreement.  CODE STATUS:     Code Status Orders  (From admission, onward)          Start     Ordered   08/06/19 1730  Full code  Continuous     08/06/19 1730        Code Status History    Date Active Date Inactive Code Status Order ID Comments User Context   08/06/2019 1730 08/06/2019 1730 Full Code [id number]  Loletha Grayer, MD ED   Advance Care Planning Activity      TOTAL TIME TAKING CARE OF THIS PATIENT: 34 minutes.    Loletha Grayer M.D on 08/08/2019 at 2:59 PM  Between 7am to 6pm - Pager - [phone]  After 6pm go to [email or web address] - password [email or web address]  [facility]  CC: Primary care physician; Donnie Coffin, MD

## 2019-08-10 NOTE — Progress Notes (Signed)
[facility]  [address] [place], [place] [address] Phone: [phone]  Fax: [phone]   Clinic Day:  08/12/2019  Referring physician: Donnie Coffin, MD  Chief Complaint: Chris Mcdonald is a 64 y.o. male with stage IIIBhepatocellular carcinomaon lenvatinib who is seen for 4 week.  HPI: The patient was last seen in the medical oncology clinic on 07/15/2019. At that time, he felt "pretty good".  He was eating well at times.  He noted intermittent abdominal pain on eating.  He had 1-2 episodes of emesis/week.  Exam was stable.  Hematocrit was 45.2, hemoglobin 14.8, platelets 146,000, WBC 5,500. Sodium was 133. Potassium was 3.1. AST 52, ALT 48, alkaline phosphatase 148. AFP was 603.0.   Potassium was increased to BID.  Patient spoke with Jennet Maduro, RD on 07/19/2019. He noted weight loss. He felt nauseous. He was eating foods he knew he could tolerate and took nausea medication 30 minutes before meals. He was drinking Boost shakes BID. His bowels were normal. He had some abdominal pain. He was encouraged to drink boost BID and eat high in protein and have a high calorie intake.   BMP on 07/29/2019 revealed a potassium of 3.6.   He was admitted to [facility] from 08/06/2019 to 08/08/2019 for transient ischemic attack with left sided numbness. Head MRI on 08/06/2019 revealed no acute intracranial abnormality  He had a remote lacunar infarct of the right thalamus.  Echo on 08/08/2019 showed an EF of 55-60%.  Carotid ultrasound on 08/07/2019 revealed no hemodynamically significant stenosis.  Patient was taking aspirin at home and during admission Plavix and Lipitor were added. Neurology recommended follow up for EMG to assess for a radiculopathy.   During the interim, the patient has felt "ok". His weight is down 4 more pounds. He notes "eating like a pig". He snacks throughout the day. His wife notes he eats only 3-4 bites of food and feels full. He is drinking 2  Ensure or Boost per day. His wife notes he has been vomiting. He notes that he only threw up once this week. I encouraged small frequent meals. He notes some abdominal pain. He is taking oral potassium once daily.   Wife and daughter would like to have [place] for the patient.    Past Medical History:  Diagnosis Date  . Arthritis   . Hepatitis   . Hypertension   . Liver cancer ([place])   . Spinal stenosis   . Stroke [facility])     Past Surgical History:  Procedure Laterality Date  . COLONOSCOPY WITH PROPOFOL N/A 02/21/2015   Procedure: COLONOSCOPY WITH PROPOFOL;  Surgeon: Lollie Sails, MD;  Location: [facility] ENDOSCOPY;  Service: Endoscopy;  Laterality: N/A;  . INGUINAL HERNIA REPAIR Bilateral   . IR RADIOLOGIST EVAL & MGMT  08/04/2018  . POSTERIOR CERVICAL VERTEBRAE EXCISION      Family History  Problem Relation Age of Onset  . Cancer Brother   . Heart disease Brother   . Cancer Mother     Social History:  reports that he has been smoking cigarettes. He has a 75.00 pack-year smoking history. He has never used smokeless tobacco. He reports that he does not drink alcohol or use drugs. [name] in [place] with his wife [phone]) and daughter.He has a 41 year old daughter, 18 year old son, and 74 year old son. The patient is accompanied by wife Arville Go today.  Allergies: No Known Allergies  Current Medications: Current Outpatient Medications  Medication  Sig Dispense Refill  . aspirin EC 81 MG tablet Take 81 mg by mouth daily.     Marland Kitchen atorvastatin (LIPITOR) 40 MG tablet Take 1 tablet (40 mg total) by mouth daily at 6 PM. 30 tablet 0  . clopidogrel (PLAVIX) 75 MG tablet Take 1 tablet (75 mg total) by mouth daily. 30 tablet 0  . fluticasone (FLONASE) 50 MCG/ACT nasal spray 1 spray by Each Nare route daily.    Marland Kitchen gabapentin (NEURONTIN) 100 MG capsule Take 1 capsule (100 mg total) by mouth 2 (two) times daily. 60 capsule 0  . hydrochlorothiazide (HYDRODIURIL) 12.5 MG tablet Take 25 mg by  mouth daily.     . Lenvatinib 4 mg daily dose (LENVIMA, 4 MG DAILY DOSE,) capsule Take 1 capsule (4 mg total) by mouth daily. 30 capsule 0  . mupirocin ointment (BACTROBAN) 2 % Apply twice a day to umbilical area 22 g 0  . omeprazole (PRILOSEC) 20 MG capsule TAKE 1 CAPSULE BY MOUTH EVERY DAY 90 capsule 1  . ondansetron (ZOFRAN) 8 MG tablet Take 1 tablet (8 mg total) by mouth every 8 (eight) hours as needed for nausea or vomiting. 20 tablet 0  . potassium chloride SA (KLOR-CON M20) 20 MEQ tablet TAKE 1 TABLET (20 MEQ TOTAL) BY MOUTH DAILY. AS DIRECTED BY MD 40 tablet 0  . nicotine (NICODERM CQ - DOSED IN MG/24 HOURS) 14 mg/24hr patch aply one patch chest wall daily (can substitute generic patch) (Patient not taking: Reported on 08/11/2019) 28 patch 0   No current facility-administered medications for this visit.    Review of Systems  Constitutional: Positive for weight loss (4 lbs). Negative for chills, diaphoresis, fever and malaise/fatigue.       Doing ok.  HENT: Negative for congestion, ear discharge, ear pain, hearing loss, nosebleeds, sinus pain, sore throat and tinnitus.   Eyes: Negative for blurred vision.  Respiratory: Negative for cough, hemoptysis, sputum production and shortness of breath.   Cardiovascular: Negative for chest pain, palpitations and leg swelling.  Gastrointestinal: Positive for abdominal pain (RUQ and epigastic area; increases whent eating), nausea (controlled on medication) and vomiting (1 episode). Negative for blood in stool, constipation, diarrhea, heartburn and melena.       Drinking Boost 2x a day. Eating well.  Genitourinary: Negative for dysuria, frequency, hematuria and urgency.  Musculoskeletal: Negative for back pain, joint pain, myalgias and neck pain.  Skin: Negative for itching and rash.  Neurological: Positive for sensory change (left arm and leg numbness 1 week ago). Negative for dizziness, tingling, weakness and headaches.       Lacunar infarct  (12/18/2016). Recent TIA.  Endo/Heme/Allergies: Does not bruise/bleed easily.  Psychiatric/Behavioral: Negative for depression and memory loss. The patient is not nervous/anxious and does not have insomnia.   All other systems reviewed and are negative.  Performance status (ECOG): 1  Vitals Blood pressure (!) 132/96, pulse 73, temperature 97.9 F (36.6 C), temperature source Tympanic, resp. rate 18, height 5\' 7"  (1.702 m), weight 108 lb 0.4 oz (49 kg), SpO2 100 %.   Physical Exam  Constitutional: He is oriented to person, place, and time. He appears well-developed and well-nourished.  Thin gentleman sitting comfortably in the exam room in no acute distress.  HENT:  Head: Normocephalic and atraumatic.  Mouth/Throat: Oropharynx is clear and moist. No oropharyngeal exudate.  Short brown hair. Edentulous. Temporal wasting. Mask.  Eyes: Pupils are equal, round, and reactive to light. Conjunctivae and EOM are normal. No scleral icterus.  Blue eyes.  Cardiovascular: Normal rate, regular rhythm and normal heart sounds.  No murmur heard. Pulmonary/Chest: Effort normal and breath sounds normal. No respiratory distress. He has no wheezes. He has no rales. He exhibits no tenderness.  Abdominal: Soft. Bowel sounds are normal. He exhibits no distension and no mass. There is no abdominal tenderness. There is no rebound and no guarding.  Musculoskeletal:        General: No tenderness or edema. Normal range of motion.     Cervical back: Normal range of motion and neck supple.  Lymphadenopathy:    He has no cervical adenopathy.    He has no axillary adenopathy.       Right: No inguinal and no supraclavicular adenopathy present.       Left: No inguinal and no supraclavicular adenopathy present.  Neurological: He is alert and oriented to person, place, and time.  Skin: Skin is warm and dry. He is not diaphoretic. No erythema.  Psychiatric: He has a normal mood and affect. His behavior is normal.  Judgment and thought content normal.  Nursing note and vitals reviewed.  Imaging studies: 06/19/2019:Low dose chest CTrevealed Lung-RADS 4A-S, suspicious. There was a new centrilobular nodularity in both lungs, more likely inflammatory. Three was a 8.5 x 5.7 cm poorly marginated geographic region of low attenuation in the superior right liver, new since 06/17/2017.  09/16/2018:Chest CTrevealed aLung-RADSscore of2, benign appearance or behavior. There was an ill-defined liver mass felt to represent a hepatoma on 07/10/2018, in the setting of cirrhosis. 07/10/2018:Abdomen MRI revealed an 8.8 cm heterogeneous infiltrating segment 8. Findings were consistent with a hepatoma, given the underlying cirrhotic changes involving the liver. This was also invading and obstructing the middle and right portal veins and the middle hepatic vein. There were scattered abdominal lymph nodes but no definite findings for metastatic disease. 11/28/2018:Abdominal MRI revealedresponse to therapy of segment 8 hepatocellular carcinoma with persistent tumor thrombus extension into the right portal vein. There was no evidence of new liver lesions or abdominal metastasis. 03/15/2019:Abdominal MRIrevealed no significant change in poorly defined mass in segment 8 of the right lobe, with persistent tumor thrombus throughout the right portal vein. There was no new liver lesions or abdominal metastatic disease. 06/15/2019:Abdominal MRIrevealedslight increase in peripheral biliary ductal dilation surrounding the infiltrative central hepatic neoplasm. Portal vein involvement may be slightly improved compared to the prior study. There was no significant change in size or new lesion.There was suggestion of subcarinal adenopathy though assessment of the chest on MRIwas limited. Consider chest CT for further evaluation. There was question of colonic thickening,whichcould herald mild colitis. Correlate  clinically.   Appointment on 08/12/2019  Component Date Value Ref Range Status  . Sodium 08/12/2019 135  135 - 145 mmol/L Final  . Potassium 08/12/2019 3.5  3.5 - 5.1 mmol/L Final  . Chloride 08/12/2019 99  98 - 111 mmol/L Final  . CO2 08/12/2019 26  22 - 32 mmol/L Final  . Glucose, Bld 08/12/2019 111* 70 - 99 mg/dL Final   Glucose reference range applies only to samples taken after fasting for at least 8 hours.  . BUN 08/12/2019 12  8 - 23 mg/dL Final  . Creatinine, Ser 08/12/2019 0.75  0.61 - 1.24 mg/dL Final  . Calcium 08/12/2019 9.2  8.9 - 10.3 mg/dL Final  . Total Protein 08/12/2019 8.2* 6.5 - 8.1 g/dL Final  . Albumin 08/12/2019 4.7  3.5 - 5.0 g/dL Final  . AST 08/12/2019 58* 15 - 41 U/L Final  .  ALT 08/12/2019 61* 0 - 44 U/L Final  . Alkaline Phosphatase 08/12/2019 187* 38 - 126 U/L Final  . Total Bilirubin 08/12/2019 1.0  0.3 - 1.2 mg/dL Final  . GFR calc non Af Amer 08/12/2019 >60  >60 mL/min Final  . GFR calc Af Amer 08/12/2019 >60  >60 mL/min Final  . Anion gap 08/12/2019 10  5 - 15 Final   Performed at [facility], [address], [place], [place] [address]  . WBC 08/12/2019 6.1  4.0 - 10.5 K/uL Final  . RBC 08/12/2019 5.03  4.22 - 5.81 MIL/uL Final  . Hemoglobin 08/12/2019 15.8  13.0 - 17.0 g/dL Final  . HCT 08/12/2019 46.9  39.0 - 52.0 % Final  . MCV 08/12/2019 93.2  80.0 - 100.0 fL Final  . MCH 08/12/2019 31.4  26.0 - 34.0 pg Final  . MCHC 08/12/2019 33.7  30.0 - 36.0 g/dL Final  . RDW 08/12/2019 12.7  11.5 - 15.5 % Final  . Platelets 08/12/2019 156  150 - 400 K/uL Final  . nRBC 08/12/2019 0.0  0.0 - 0.2 % Final  . Neutrophils Relative % 08/12/2019 62  % Final  . Neutro Abs 08/12/2019 3.8  1.7 - 7.7 K/uL Final  . Lymphocytes Relative 08/12/2019 29  % Final  . Lymphs Abs 08/12/2019 1.8  0.7 - 4.0 K/uL Final  . Monocytes Relative 08/12/2019 6  % Final  . Monocytes Absolute 08/12/2019 0.4  0.1 - 1.0 K/uL Final  . Eosinophils Relative 08/12/2019 2  %  Final  . Eosinophils Absolute 08/12/2019 0.1  0.0 - 0.5 K/uL Final  . Basophils Relative 08/12/2019 1  % Final  . Basophils Absolute 08/12/2019 0.0  0.0 - 0.1 K/uL Final  . Immature Granulocytes 08/12/2019 0  % Final  . Abs Immature Granulocytes 08/12/2019 0.02  0.00 - 0.07 K/uL Final   Performed at [facility], [address], [place], [place] [address]    Assessment:  Chris Mcdonald is a 64 y.o. male with unresectable hepatocellular carcinoma. He has a history of hepatitis C(treated with Harvoni in 04/11/2015). He has hepatitis B. Mass was discovered incidentally on low dose chest CT screenig program. AFPwas 7329. Child-Pugh ClassisA (score 5).  Low dose chest CTon 06/18/2018 revealed Lung-RADS 4A-S, suspicious. There was a new centrilobular nodularity in both lungs, more likely inflammatory. Three was a 8.5 x 5.7 cm poorly marginated geographic region of low attenuation in the superior right liver, new since 06/17/2017. Chest CTon 09/16/2018 revealed aLung-RADSscore of2, benign appearance or behavior. There was an ill-defined liver mass felt to represent a hepatoma on 07/10/2018, in the setting of cirrhosis.  Abdomen MRI on 07/10/2018 revealed an 8.8 cm heterogeneous infiltrating segment 8. Findings were consistent with a hepatoma, given the underlying cirrhotic changes involving the liver. This was also invading and obstructing the middle and right portal veins and the middle hepatic vein. There were scattered abdominal lymph nodes but no definite findings for metastatic disease.  AFPhas been followed: [id number] on 07/27/2018,6250 on 08/17/2018, 467 on 11/18/2018, 271 on 12/29/2018, 2320 on 02/08/2019, 1548 on 03/08/2019, 721 on 04/07/2019, 500 on 05/05/2019, 501.0 on 06/14/2019, 642 on 07/08/2019, 603 on 07/15/2019, and 612 on 08/12/2019.  He began lenvatinibon 08/25/2018.Hewasoff lenvatinibfrom09/09/2018 - 02/14/2019.He has continued  lenvatinib.Lenvatinib was decreased to 4 mg a day on 06/17/2019.  Abdominal MRI on 06/15/2019 showedslight increase in peripheral biliary ductal dilation surrounding the infiltrative central hepatic neoplasm. Portal vein involvement may be slightly improved compared  to the prior study. There was no significant change in size or new lesion.There was suggestion of subcarinal adenopathy.There was question of colonic thickening,whichcould herald mild colitis.  He was admitted to [facility] from 08/06/2019 to 08/08/2019 for transient ischemic attack with left sided numbness. Head MRI on 08/06/2019 revealed no acute intracranial abnormality  He had a remote lacunar infarct of the right thalamus.  Echo on 08/08/2019 showed an EF of 55-60%.  Carotid ultrasound on 08/07/2019 revealed no hemodynamically significant stenosis.  Plavix and Lipitor were added to his aspirin.  Neurology recommended follow up for EMG to assess for a radiculopathy.   He has a history of hypercalcemia. Calcium was 11.0 on 08/17/2018. PTH and PTH-rp were low on 08/19/2018.Calcium was 9.3 on 11/30/2018.  He has hypokalemia likely secondary to HCTZ.  He is on oral potassium.  Symptomatically, his wife notes that he is eating only a few bites.  Weight is down 4 pounds.  Food causes abdominal discomfort.  Plan: 1.   Labs today: CBC with diff, CMP, AFP. 2. Hepatocellular carcinoma Clinically,he is doing fairly well on lenvatinib 4 mg a day.  He was intolerant to 8 mg/day.             Symptomatically, he has persistent abdominal discomfort with eating.             He describes rare emesis.             Encourage small frequent meals, Boost, and use of anti-emetics. AFPinitiallyimproved from [id number] to 721 to 500then stablizedover past28months.                         AFP is 612 today. Abdominal MRIon 02/23/2021revealed stable liver lesion Discuss  plan for repeat abdominal MRI.  Discuss symptom management.  He has antiemetics and pain medications at home to use on a prn bases.  Interventions are adequate.    3. Abdominal discomfort He has intermittent RUQ (liver) and epigastric pain with eating. Symptoms initially improved with decreased dose of lenvatinib.   No further ability to decrease dose. Abdominal pain incidence approximately 30% with lenvatinib. Amylase and lipasewere normal previously. Continue omperazole. 4. Weight loss Weight is down 4 pounds in the past month.             Encourage small frequent meals.             Encourage taking antiemetics 30 minutes prior to meals. Samples of Ensure/Boost today. 5.Hypokalemia             Potassium is 3.5.             Continue oral potassium supplementation. 6.   Hypertension Blood pressure132/96. He has baseline hypertension.  He is onHCTZ 25 mg a day and amlodipine 10 mg a day. Hypertension is exacerbated by lenvatinib. Continue to monitor. 7.   Abdomen MRI 08/26/2019. 8.   RTC after MRI for MD assessment and review of imaging.  I discussed the assessment and treatment plan with the patient.  The patient was provided an opportunity to ask questions and all were answered.  The patient agreed with the plan and demonstrated an understanding of the instructions.  The patient was advised to call back if the symptoms worsen or if the condition fails to improve as anticipated.  I provided 20 minutes of face-to-face time during this encounter and > 50% was spent counseling as documented under my assessment and plan.    Ellyson Rarick C  Mike Gip, MD, PhD    08/12/2019, 3:01 PM  I, Selena Batten, am acting as scribe for [employer]. Mike Gip, MD, PhD.  I, Rajesh Wyss C. Mike Gip, MD, have reviewed the above  documentation for accuracy and completeness, and I agree with the above.

## 2019-08-11 ENCOUNTER — Encounter: Payer: Self-pay | Admitting: Hematology and Oncology

## 2019-08-11 ENCOUNTER — Other Ambulatory Visit: Payer: Self-pay

## 2019-08-11 NOTE — Progress Notes (Signed)
The patient states he has not had any vomiting x 1 week. No new changes noted today. The patient Name and DOB has been verified by phone today.

## 2019-08-12 ENCOUNTER — Inpatient Hospital Stay (HOSPITAL_BASED_OUTPATIENT_CLINIC_OR_DEPARTMENT_OTHER): Payer: BC Managed Care – PPO | Admitting: Hematology and Oncology

## 2019-08-12 ENCOUNTER — Encounter: Payer: Self-pay | Admitting: Hematology and Oncology

## 2019-08-12 ENCOUNTER — Inpatient Hospital Stay: Payer: BC Managed Care – PPO

## 2019-08-12 VITALS — BP 132/96 | HR 73 | Temp 97.9°F | Resp 18 | Ht 67.0 in | Wt 108.0 lb

## 2019-08-12 DIAGNOSIS — Z7982 Long term (current) use of aspirin: Secondary | ICD-10-CM | POA: Diagnosis not present

## 2019-08-12 DIAGNOSIS — E876 Hypokalemia: Secondary | ICD-10-CM | POA: Diagnosis not present

## 2019-08-12 DIAGNOSIS — I10 Essential (primary) hypertension: Secondary | ICD-10-CM | POA: Diagnosis not present

## 2019-08-12 DIAGNOSIS — R109 Unspecified abdominal pain: Secondary | ICD-10-CM | POA: Insufficient documentation

## 2019-08-12 DIAGNOSIS — R634 Abnormal weight loss: Secondary | ICD-10-CM | POA: Diagnosis not present

## 2019-08-12 DIAGNOSIS — C220 Liver cell carcinoma: Secondary | ICD-10-CM | POA: Diagnosis present

## 2019-08-12 DIAGNOSIS — Z79899 Other long term (current) drug therapy: Secondary | ICD-10-CM | POA: Diagnosis not present

## 2019-08-12 DIAGNOSIS — F17210 Nicotine dependence, cigarettes, uncomplicated: Secondary | ICD-10-CM | POA: Diagnosis not present

## 2019-08-12 DIAGNOSIS — Z87891 Personal history of nicotine dependence: Secondary | ICD-10-CM | POA: Diagnosis not present

## 2019-08-12 DIAGNOSIS — Z7189 Other specified counseling: Secondary | ICD-10-CM | POA: Diagnosis not present

## 2019-08-12 DIAGNOSIS — Z7902 Long term (current) use of antithrombotics/antiplatelets: Secondary | ICD-10-CM | POA: Diagnosis not present

## 2019-08-12 DIAGNOSIS — Z8673 Personal history of transient ischemic attack (TIA), and cerebral infarction without residual deficits: Secondary | ICD-10-CM | POA: Diagnosis not present

## 2019-08-12 DIAGNOSIS — I639 Cerebral infarction, unspecified: Secondary | ICD-10-CM

## 2019-08-12 DIAGNOSIS — E871 Hypo-osmolality and hyponatremia: Secondary | ICD-10-CM

## 2019-08-12 DIAGNOSIS — Z8249 Family history of ischemic heart disease and other diseases of the circulatory system: Secondary | ICD-10-CM | POA: Diagnosis not present

## 2019-08-12 LAB — COMPREHENSIVE METABOLIC PANEL
ALT: 61 U/L — ABNORMAL HIGH (ref 0–44)
AST: 58 U/L — ABNORMAL HIGH (ref 15–41)
Albumin: 4.7 g/dL (ref 3.5–5.0)
Alkaline Phosphatase: 187 U/L — ABNORMAL HIGH (ref 38–126)
Anion gap: 10 (ref 5–15)
BUN: 12 mg/dL (ref 8–23)
CO2: 26 mmol/L (ref 22–32)
Calcium: 9.2 mg/dL (ref 8.9–10.3)
Chloride: 99 mmol/L (ref 98–111)
Creatinine, Ser: 0.75 mg/dL (ref 0.61–1.24)
GFR calc Af Amer: >60 mL/min (ref >60)
GFR calc non Af Amer: >60 mL/min (ref >60)
Glucose, Bld: 111 mg/dL — ABNORMAL HIGH (ref 70–99)
Potassium: 3.5 mmol/L (ref 3.5–5.1)
Sodium: 135 mmol/L (ref 135–145)
Total Bilirubin: 1 mg/dL (ref 0.3–1.2)
Total Protein: 8.2 g/dL — ABNORMAL HIGH (ref 6.5–8.1)

## 2019-08-12 LAB — CBC WITH DIFFERENTIAL/PLATELET
Abs Immature Granulocytes: 0.02 10*3/uL (ref 0.00–0.07)
Basophils Absolute: 0 10*3/uL (ref 0.0–0.1)
Basophils Relative: 1 %
Eosinophils Absolute: 0.1 10*3/uL (ref 0.0–0.5)
Eosinophils Relative: 2 %
HCT: 46.9 % (ref 39.0–52.0)
Hemoglobin: 15.8 g/dL (ref 13.0–17.0)
Immature Granulocytes: 0 %
Lymphocytes Relative: 29 %
Lymphs Abs: 1.8 10*3/uL (ref 0.7–4.0)
MCH: 31.4 pg (ref 26.0–34.0)
MCHC: 33.7 g/dL (ref 30.0–36.0)
MCV: 93.2 fL (ref 80.0–100.0)
Monocytes Absolute: 0.4 10*3/uL (ref 0.1–1.0)
Monocytes Relative: 6 %
Neutro Abs: 3.8 10*3/uL (ref 1.7–7.7)
Neutrophils Relative %: 62 %
Platelets: 156 10*3/uL (ref 150–400)
RBC: 5.03 MIL/uL (ref 4.22–5.81)
RDW: 12.7 % (ref 11.5–15.5)
WBC: 6.1 10*3/uL (ref 4.0–10.5)
nRBC: 0 % (ref 0.0–0.2)

## 2019-08-13 LAB — AFP TUMOR MARKER: AFP, Serum, Tumor Marker: 612 ng/mL — ABNORMAL HIGH (ref 0.0–8.3)

## 2019-08-16 ENCOUNTER — Telehealth: Payer: Self-pay

## 2019-08-16 NOTE — Telephone Encounter (Signed)
Spoke with the patient to inform him where he need to call for his refills. The call number [phone] to refill his Lenvima 4 mg daily. I have given the patient the number again today. The patient was understanding and agreeable.

## 2019-08-23 ENCOUNTER — Inpatient Hospital Stay: Payer: Medicare Other | Attending: Hematology and Oncology

## 2019-08-23 NOTE — Progress Notes (Addendum)
Nutrition Follow-up:  Patient with hepatocellular carcinoma on lenvatinib, dose reduced.  Noted recent admission for TIA.  Spoke with patient via phone for nutrition follow-up.  Patient reports that his weight keeps going down.  States that appetite is good.  Has eaten grits today for breakfast and beanie weanies for lunch and peanut butter crackers.  Reports that he is drinking about 2 boost/ensure per day. Reports nausea about every 2-3 days.  Bowels are moving normally.      Medications: reviewed  Labs: reviewed  Anthropometrics:   Weight 108 lb on 4/22 decreased from 112 lb on 3/25  ?? Weight loss from recent hospital admission.   NUTRITION DIAGNOSIS: Inadequate oral intake continues   INTERVENTION:  Patient to increase boost/ensure plus shakes (350 calories) to TID Continue eating high calorie and high protein foods Patient has contact information  MONITORING, EVALUATION, GOAL: Patient will consume adequate calories and protein to prevent weight loss  NEXT VISIT: phone f/u June 14th  Chris Mcdonald, Chris Mcdonald, Chris Mcdonald Registered Dietitian [phone] (pager)

## 2019-08-26 ENCOUNTER — Ambulatory Visit
Admission: RE | Admit: 2019-08-26 | Discharge: 2019-08-26 | Disposition: A | Discharge status: Home / Self Care | Payer: BC Managed Care – PPO | Source: Ambulatory Visit | Attending: Hematology and Oncology | Admitting: Hematology and Oncology

## 2019-08-26 ENCOUNTER — Other Ambulatory Visit: Payer: Self-pay

## 2019-08-26 DIAGNOSIS — C220 Liver cell carcinoma: Secondary | ICD-10-CM

## 2019-08-26 MED ORDER — GADOBUTROL 1 MMOL/ML IV SOLN
4.0000 mL | Freq: Once | INTRAVENOUS | Status: AC | PRN
  Administered 2019-08-26: 17:00:00 4 mL via INTRAVENOUS

## 2019-08-26 NOTE — Progress Notes (Signed)
[facility]  [address] [place], [place] [address] Phone: [phone]  Fax: [phone]   Clinic Day:  08/27/2019  Referring physician: Donnie Coffin, MD  Chief Complaint: Chris Mcdonald is a 64 y.o. male with stage IIIBhepatocellular carcinomaon lenvatinib who is seen for review of interval MRI and discussion regarding direction of therapy.  HPI: The patient was last seen in the medical oncology clinic on 08/12/2019. At that time, he felt "ok".  He was losing weight despite his report of eating well  His wife noted that he would takes 3-4 bites of food then felt full.  He noted intermittent abdominal pain on eating.  He continued to have 1-2 episodes of emesis/week.  Exam was stable.  AST was 58, ALT 61, and alkaline phosphatase 187. AFP was 612.0.   He spoke with Jennet Maduro, registered dietician on 08/23/2019.  He was felt to have inadequate caloric intake with possible weight loss from his recent hospitalization.  He was to increase his Boost/Ensure plus shakes (350 calories) to TID.  He was to continue high calorie and high protein foods.  He has a follow-up on 10/04/2019.  Abdominal MRI on 08/26/2019 revealed a branching, irregular hypoenhancing mass of the right lobe of the liver with associated atrophy of the right lobe, biliary ductal dilatation and right portal vein invasion/occlusion. The bulk and configuration of the mass was best appreciated on coronal images, with the largest components located very centrally and in the subcapsular dome of the right lobe.  The mass was not significantly changed. There was diffuse, somewhat heterogeneous arterial hyperenhancement of the right lobe of the liver, likely due to perfusion anomaly given occlusion of the right portal vein. There were multiple subcentimeter arterially enhancing foci throughout the left lobe of liver, without evidence of discrete mass, washout, or capsule.   During the interim, he notes  he has felt "ok". He denies nausea; he has not had to take any nausea pills. He states that since he has stopped taking lenvatinib on 08/22/2019, he is eating about twice as much as before.  He does not wish to take lenvatinib.  He is getting in at least two bottles of Boost per day. He notes that drinking 3 bottles of Boost makes him bloat up too much.  He feels better.  He has gained a pound.   Past Medical History:  Diagnosis Date  . Arthritis   . Hepatitis   . Hypertension   . Liver cancer ([place])   . Spinal stenosis   . Stroke [facility])     Past Surgical History:  Procedure Laterality Date  . COLONOSCOPY WITH PROPOFOL N/A 02/21/2015   Procedure: COLONOSCOPY WITH PROPOFOL;  Surgeon: Lollie Sails, MD;  Location: [facility] ENDOSCOPY;  Service: Endoscopy;  Laterality: N/A;  . INGUINAL HERNIA REPAIR Bilateral   . IR RADIOLOGIST EVAL & MGMT  08/04/2018  . POSTERIOR CERVICAL VERTEBRAE EXCISION      Family History  Problem Relation Age of Onset  . Cancer Brother   . Heart disease Brother   . Cancer Mother     Social History:  reports that he has been smoking cigarettes. He has a 75.00 pack-year smoking history. He has never used smokeless tobacco. He reports that he does not drink alcohol or use drugs. Patient lives in [place] with his wife and daughter.He has a 37 year old daughter, 46 year old son, and 42 year old son. The patient is alone today.   Allergies:  No Known Allergies  Current Medications: Current Outpatient Medications  Medication Sig Dispense Refill  . aspirin EC 81 MG tablet Take 81 mg by mouth daily.     Marland Kitchen atorvastatin (LIPITOR) 40 MG tablet Take 1 tablet (40 mg total) by mouth daily at 6 PM. 30 tablet 0  . clopidogrel (PLAVIX) 75 MG tablet Take 1 tablet (75 mg total) by mouth daily. 30 tablet 0  . fluticasone (FLONASE) 50 MCG/ACT nasal spray 1 spray by Each Nare route daily.    Marland Kitchen gabapentin (NEURONTIN) 100 MG capsule Take 1 capsule (100 mg total) by mouth 2 (two) times  daily. 60 capsule 0  . hydrochlorothiazide (HYDRODIURIL) 12.5 MG tablet Take 25 mg by mouth daily.     . mupirocin ointment (BACTROBAN) 2 % Apply twice a day to umbilical area 22 g 0  . omeprazole (PRILOSEC) 20 MG capsule TAKE 1 CAPSULE BY MOUTH EVERY DAY 90 capsule 1  . potassium chloride SA (KLOR-CON M20) 20 MEQ tablet TAKE 1 TABLET (20 MEQ TOTAL) BY MOUTH DAILY. AS DIRECTED BY MD 40 tablet 0  . Lenvatinib 4 mg daily dose (LENVIMA, 4 MG DAILY DOSE,) capsule Take 1 capsule (4 mg total) by mouth daily. (Patient not taking: Reported on 08/27/2019) 30 capsule 0  . nicotine (NICODERM CQ - DOSED IN MG/24 HOURS) 14 mg/24hr patch aply one patch chest wall daily (can substitute generic patch) (Patient not taking: Reported on 08/11/2019) 28 patch 0  . ondansetron (ZOFRAN) 8 MG tablet Take 1 tablet (8 mg total) by mouth every 8 (eight) hours as needed for nausea or vomiting. (Patient not taking: Reported on 08/27/2019) 20 tablet 0   No current facility-administered medications for this visit.    Review of Systems  Constitutional: Negative for chills, fever, malaise/fatigue and weight loss (up 1 lb).       Feels "pretty good".  HENT: Negative.  Negative for congestion, ear pain, hearing loss, nosebleeds, sinus pain, sore throat and tinnitus.   Eyes: Negative.  Negative for blurred vision and double vision.  Respiratory: Negative.  Negative for cough, hemoptysis, sputum production and shortness of breath.   Cardiovascular: Negative.  Negative for chest pain, palpitations and leg swelling.  Gastrointestinal: Negative for abdominal pain (RUQ and epigastric area; increases when eating), blood in stool, constipation, diarrhea, heartburn, melena, nausea (controlled on medication) and vomiting.       Appetite and eating have improved.  Drinking Boost x 2 per day.  Genitourinary: Negative.  Negative for dysuria, frequency and urgency.  Musculoskeletal: Negative.  Negative for back pain, falls, myalgias and neck pain.   Skin: Negative.  Negative for rash.  Neurological: Negative.  Negative for tingling, tremors, sensory change, focal weakness, weakness and headaches.  Endo/Heme/Allergies: Negative.  Does not bruise/bleed easily.  Psychiatric/Behavioral: Negative.  Negative for depression. The patient is not nervous/anxious and does not have insomnia.   All other systems reviewed and are negative.  Performance status (ECOG):  1  Vitals Blood pressure 124/83, pulse 77, temperature 97.8 F (36.6 C), temperature source Tympanic, resp. rate 18, height '5\' 7"'$  (1.702 m), weight 109 lb 3.8 oz (49.6 kg), SpO2 100 %.   Physical Exam  Constitutional: He is oriented to person, place, and time.  Thin gentleman sitting comfortably in the exam room in no acute distress.  HENT:  Head: Normocephalic and atraumatic.  Mouth/Throat: Oropharynx is clear and moist. No oropharyngeal exudate.  Short brown hair.  Edentulous.  Temporal wasting.  Mask.  Eyes: Pupils are  equal, round, and reactive to light. Conjunctivae and EOM are normal. No scleral icterus.  Blue eyes.  Neck: No JVD present.  Cardiovascular: Normal rate, regular rhythm and normal heart sounds. Exam reveals no gallop.  No murmur heard. Pulmonary/Chest: Effort normal and breath sounds normal. No respiratory distress. He has no wheezes. He has no rales.  Abdominal: Soft. Bowel sounds are normal. He exhibits no distension and no mass. There is no splenomegaly or hepatomegaly. There is no abdominal tenderness. There is no rebound and no guarding.  Musculoskeletal:        General: No tenderness or edema. Normal range of motion.     Cervical back: Normal range of motion and neck supple.  Lymphadenopathy:       Head (right side): No preauricular, no posterior auricular and no occipital adenopathy present.       Head (left side): No preauricular, no posterior auricular and no occipital adenopathy present.    He has no cervical adenopathy.    He has no axillary  adenopathy.       Right: No inguinal and no supraclavicular adenopathy present.       Left: No inguinal and no supraclavicular adenopathy present.  Neurological: He is alert and oriented to person, place, and time.  Skin: Skin is warm and dry. No rash noted. He is not diaphoretic. No erythema. No pallor.  Psychiatric: He has a normal mood and affect. His behavior is normal. Judgment and thought content normal.  Nursing note and vitals reviewed.   Imaging studies: 06/19/2019:Low dose chest CTrevealed Lung-RADS 4A-S, suspicious. There was a new centrilobular nodularity in both lungs, more likely inflammatory. Three was a 8.5 x 5.7 cm poorly marginated geographic region of low attenuation in the superior right liver, new since 06/17/2017.  09/16/2018:Chest CTrevealed aLung-RADSscore of2, benign appearance or behavior. There was an ill-defined liver mass felt to represent a hepatoma on 07/10/2018, in the setting of cirrhosis. 07/10/2018:Abdomen MRI revealed an 8.8 cm heterogeneous infiltrating segment 8. Findings were consistent with a hepatoma, given the underlying cirrhotic changes involving the liver. This was also invading and obstructing the middle and right portal veins and the middle hepatic vein. There were scattered abdominal lymph nodes but no definite findings for metastatic disease. 11/28/2018:Abdominal MRI revealedresponse to therapy of segment 8 hepatocellular carcinoma with persistent tumor thrombus extension into the right portal vein. There was no evidence of new liver lesions or abdominal metastasis. 03/15/2019:Abdominal MRIrevealed no significant change in poorly defined mass in segment 8 of the right lobe, with persistent tumor thrombus throughout the right portal vein. There was no new liver lesions or abdominal metastatic disease. 06/15/2019:Abdominal MRIrevealedslight increase in peripheral biliary ductal dilation surrounding the infiltrative central  hepatic neoplasm. Portal vein involvement may be slightly improved compared to the prior study. There was no significant change in size or new lesion.There was suggestion of subcarinal adenopathy though assessment of the chest on MRIwas limited. Consider chest CT for further evaluation. There was question of colonic thickening,whichcould herald mild colitis. Correlate clinically. 08/26/2019:  Abdominal MRI revealed a branching, irregular hypoenhancing mass of the right lobe of the liver with associated atrophy of the right lobe, biliary ductal dilatation and right portal vein invasion/occlusion. The bulk and configuration of the mass was best appreciated on coronal images, with the largest components located very centrally and in the subcapsular dome of the right lobe.  The mass was not significantly changed. There was diffuse, somewhat heterogeneous arterial hyperenhancement of the right lobe of the  liver, likely due to perfusion anomaly given occlusion of the right portal vein. There were multiple subcentimeter arterially enhancing foci throughout the left lobe of liver, without evidence of discrete mass, washout, or capsule.    No visits with results within 3 Day(s) from this visit.  Latest known visit with results is:  Appointment on 08/12/2019  Component Date Value Ref Range Status  . AFP, Serum, Tumor Marker 08/12/2019 612.0* 0.0 - 8.3 ng/mL Final   Comment: (NOTE) Roche [employer]ostics Electrochemiluminescence Immunoassay (ECLIA) Values obtained with different assay methods or kits cannot be used interchangeably.  Results cannot be interpreted as absolute evidence of the presence or absence of malignant disease. This test is not interpretable in pregnant females. Performed At: [facility] [place], [place] [id number] Rush Farmer MD SP:[id number]   . Sodium 08/12/2019 135  135 - 145 mmol/L Final  . Potassium 08/12/2019 3.5  3.5 - 5.1 mmol/L Final  . Chloride  08/12/2019 99  98 - 111 mmol/L Final  . CO2 08/12/2019 26  22 - 32 mmol/L Final  . Glucose, Bld 08/12/2019 111* 70 - 99 mg/dL Final   Glucose reference range applies only to samples taken after fasting for at least 8 hours.  . BUN 08/12/2019 12  8 - 23 mg/dL Final  . Creatinine, Ser 08/12/2019 0.75  0.61 - 1.24 mg/dL Final  . Calcium 08/12/2019 9.2  8.9 - 10.3 mg/dL Final  . Total Protein 08/12/2019 8.2* 6.5 - 8.1 g/dL Final  . Albumin 08/12/2019 4.7  3.5 - 5.0 g/dL Final  . AST 08/12/2019 58* 15 - 41 U/L Final  . ALT 08/12/2019 61* 0 - 44 U/L Final  . Alkaline Phosphatase 08/12/2019 187* 38 - 126 U/L Final  . Total Bilirubin 08/12/2019 1.0  0.3 - 1.2 mg/dL Final  . GFR calc non Af Amer 08/12/2019 >60  >60 mL/min Final  . GFR calc Af Amer 08/12/2019 >60  >60 mL/min Final  . Anion gap 08/12/2019 10  5 - 15 Final   Performed at [facility], [address], [place], [place] [address]  . WBC 08/12/2019 6.1  4.0 - 10.5 K/uL Final  . RBC 08/12/2019 5.03  4.22 - 5.81 MIL/uL Final  . Hemoglobin 08/12/2019 15.8  13.0 - 17.0 g/dL Final  . HCT 08/12/2019 46.9  39.0 - 52.0 % Final  . MCV 08/12/2019 93.2  80.0 - 100.0 fL Final  . MCH 08/12/2019 31.4  26.0 - 34.0 pg Final  . MCHC 08/12/2019 33.7  30.0 - 36.0 g/dL Final  . RDW 08/12/2019 12.7  11.5 - 15.5 % Final  . Platelets 08/12/2019 156  150 - 400 K/uL Final  . nRBC 08/12/2019 0.0  0.0 - 0.2 % Final  . Neutrophils Relative % 08/12/2019 62  % Final  . Neutro Abs 08/12/2019 3.8  1.7 - 7.7 K/uL Final  . Lymphocytes Relative 08/12/2019 29  % Final  . Lymphs Abs 08/12/2019 1.8  0.7 - 4.0 K/uL Final  . Monocytes Relative 08/12/2019 6  % Final  . Monocytes Absolute 08/12/2019 0.4  0.1 - 1.0 K/uL Final  . Eosinophils Relative 08/12/2019 2  % Final  . Eosinophils Absolute 08/12/2019 0.1  0.0 - 0.5 K/uL Final  . Basophils Relative 08/12/2019 1  % Final  . Basophils Absolute 08/12/2019 0.0  0.0 - 0.1 K/uL Final  . Immature Granulocytes  08/12/2019 0  % Final  . Abs Immature Granulocytes 08/12/2019 0.02  0.00 - 0.07 K/uL  Final   Performed at [facility], [address] [place], [place] [address]    Assessment:  Chris Mcdonald is a 64 y.o. male with unresectable hepatocellular carcinoma. He has a history of hepatitis C(treated with Harvoni in 04/11/2015). He has hepatitis B. Mass was discovered incidentally on low dose chest CT screenig program. AFPwas 7329. Child-Pugh ClassisA (score 5).  Low dose chest CTon 06/18/2018 revealed Lung-RADS 4A-S, suspicious. There was a new centrilobular nodularity in both lungs, more likely inflammatory. Three was a 8.5 x 5.7 cm poorly marginated geographic region of low attenuation in the superior right liver, new since 06/17/2017. Chest CTon 09/16/2018 revealed aLung-RADSscore of2, benign appearance or behavior. There was an ill-defined liver mass felt to represent a hepatoma on 07/10/2018, in the setting of cirrhosis.  Abdomen MRI on 07/10/2018 revealed an 8.8 cm heterogeneous infiltrating segment 8. Findings were consistent with a hepatoma, given the underlying cirrhotic changes involving the liver. This was also invading and obstructing the middle and right portal veins and the middle hepatic vein. There were scattered abdominal lymph nodes but no definite findings for metastatic disease.  AFPhas been followed: 2355 on 07/27/2018,6250 on 08/17/2018, 467 on 11/18/2018, 271 on 12/29/2018, 2320 on 02/08/2019, 1548 on 03/08/2019, 721 on 04/07/2019, 500 on 05/05/2019, 501.0 on 06/14/2019, 642 on 07/08/2019, 603 on 07/15/2019, and 612 on 08/12/2019.  He began lenvatinibon 08/25/2018.Hewasoff lenvatinibfrom09/09/2018 - 02/14/2019.He has continued lenvatinib.  Lenvatinib was decreased to 4 mg a day on 06/17/2019.  He discontinued lenvatinib on 08/22/2019.  Abdominal MRI on 06/15/2019 showedslight increase in peripheral biliary ductal dilation surrounding  the infiltrative central hepatic neoplasm. Portal vein involvement may be slightly improved compared to the prior study. There was no significant change in size or new lesion.There was suggestion of subcarinal adenopathy.There was question of colonic thickening,whichcould herald mild colitis.  Abdominal MRI on 08/26/2019 revealed a branching, irregular hypoenhancing mass of the right lobe of the liver with associated atrophy of the right lobe, biliary ductal dilatation and right portal vein invasion/occlusion. The mass was not significantly changed. There was diffuse, somewhat heterogeneous arterial hyperenhancement of the right lobe of the liver, likely due to perfusion anomaly. There were multiple subcentimeter arterially enhancing foci throughout the left lobe of liver, without evidence of discrete mass, washout, or capsule.   He has a history of hypercalcemia. Calcium was 11.0 on 08/17/2018. PTH and PTH-rp were low on 08/19/2018.Calcium was 9.3 on 11/30/2018.  EKG on 06/17/2019 revealed no increased QTc.  Symptomatically, he feels better off lenvatinib.  Appetite has improved.  He has gained 1 pound.  Plan: 1.   Hepatocellular carcinoma Clinically,he is doing fairly well.  He was intolerant to 8 mg/day as well as 4 mg/day.  Patient stopped taking lenvatinib on 08/22/2019.   He is eating better with less nausea and improved quality of life. AFPinitiallyimproved from 7322 to 721 to 500then stablizedover past62month.   AFP is 612 on 08/12/2019. Abdominal MRIon 06/15/2019 revealed stable liver lesion              Abdominal MRI on 08/26/2019 was personally reviewed.  Agree with radiology interpretation.   Mass remains stable.  He remains Child Pugh class A (score 5).  Discuss other options for unresectable hepatocellular carcinoma.   Patient is not a candidate for atezolizumab + bevacizumab secondary to recurrent  TIA/CVA.   Consider sorafenib, regorafenib, cabozantinib, ramucirumab, nivolumab +/- ipilumumab, or pembrolizumab.    Suspect patient will not be able to tolerate sorafenib.  Potential issues with hypertension: sorafenib, regorafenib, cabozantinib, ramucirumab.    Ramucirumab noted to have 2% risk of arterial thromboembolism and cerebral ischemia/CVA not defined.    Cabozantinib noted to have 2% risk of arterial thromboembolism, 4% pulmonary embolism and 6% venous thromboembolism.   Typically, cabozantinib offered to patients intolerant to lenvatinib.  If unable to proceed with TKI, anticipate immunotherapy.  Discuss symptom management.  He has antiemetics at home to use on a prn bases.  Interventions are adequate.    2. Abdominal discomfort He has intermittent RUQ (liver) and epigastricpain.  Symptoms have improved with discontinuation of lenvatinib.  Abdominal pain incidence approximately 30% with lenvatinib. Continue to monitor. 3. Weight loss Weight up 1 pound after discontinuation of lenvatinib.  He is eating small frequent meals.  He is drinking 2 Ensure/Boost per day (3 causes bloating).  He has met with the registered dietician.  Continue to monitor. 4.Hypertension Blood pressure123/63 off lenvatinib. He has baseline hypertension.                          He is onHCTZ 25 mg a day and amlodipine 10 mg a day. Continue to monitor. 5.   Contact Nuala Alpha, pharmacist, regarding coverage for oral chemotherapy (cabozantinib). 6.   Information provided on new oral chemotherapy options. 7.   Patient to call back after reading material. 8.   RTC 09/06/2019 for MD assessment, labs (CBC with diff, CMP), and initiation of new oral chemotherapy.  I discussed the assessment and treatment plan with the patient.  The patient was provided an opportunity to ask questions and all were answered.   The patient agreed with the plan and demonstrated an understanding of the instructions.  The patient was advised to call back if the symptoms worsen or if the condition fails to improve as anticipated.   Lequita Asal, MD, PhD    08/27/2019, 9:42 AM  I, Jacqualyn Posey, am acting as a Education administrator for [employer]. Mike Gip, MD.   I, Aubrey Voong C. Mike Gip, MD, have reviewed the above documentation for accuracy and completeness, and I agree with the above.

## 2019-08-27 ENCOUNTER — Encounter: Payer: Self-pay | Admitting: Hematology and Oncology

## 2019-08-27 ENCOUNTER — Inpatient Hospital Stay: Payer: BC Managed Care – PPO | Attending: Hematology and Oncology | Admitting: Hematology and Oncology

## 2019-08-27 ENCOUNTER — Other Ambulatory Visit: Payer: Self-pay

## 2019-08-27 VITALS — BP 124/83 | HR 77 | Temp 97.8°F | Resp 18 | Ht 67.0 in | Wt 109.2 lb

## 2019-08-27 DIAGNOSIS — R634 Abnormal weight loss: Secondary | ICD-10-CM

## 2019-08-27 DIAGNOSIS — F17210 Nicotine dependence, cigarettes, uncomplicated: Secondary | ICD-10-CM | POA: Diagnosis not present

## 2019-08-27 DIAGNOSIS — C220 Liver cell carcinoma: Secondary | ICD-10-CM | POA: Insufficient documentation

## 2019-08-27 DIAGNOSIS — Z7189 Other specified counseling: Secondary | ICD-10-CM

## 2019-08-27 DIAGNOSIS — Z7982 Long term (current) use of aspirin: Secondary | ICD-10-CM | POA: Insufficient documentation

## 2019-08-27 DIAGNOSIS — R1013 Epigastric pain: Secondary | ICD-10-CM

## 2019-08-27 DIAGNOSIS — Z79899 Other long term (current) drug therapy: Secondary | ICD-10-CM | POA: Diagnosis not present

## 2019-08-27 DIAGNOSIS — K7460 Unspecified cirrhosis of liver: Secondary | ICD-10-CM | POA: Diagnosis not present

## 2019-08-27 DIAGNOSIS — Z8673 Personal history of transient ischemic attack (TIA), and cerebral infarction without residual deficits: Secondary | ICD-10-CM | POA: Insufficient documentation

## 2019-08-27 DIAGNOSIS — I10 Essential (primary) hypertension: Secondary | ICD-10-CM | POA: Insufficient documentation

## 2019-08-27 DIAGNOSIS — I639 Cerebral infarction, unspecified: Secondary | ICD-10-CM

## 2019-08-27 DIAGNOSIS — E876 Hypokalemia: Secondary | ICD-10-CM

## 2019-08-27 NOTE — Progress Notes (Signed)
The patient c/o abdomen pain (5). The patient states without taking Lenvima he has not had any Nausea or vomiting. He also states his appetite has been better

## 2019-08-27 NOTE — Patient Instructions (Signed)
Stop the lenvatinib   Pembrolizumab injection What is this medicine? PEMBROLIZUMAB (pem broe liz ue mab) is a monoclonal antibody. It is used to treat certain types of cancer. This medicine may be used for other purposes; ask your health care provider or pharmacist if you have questions. COMMON BRAND NAME(S): Keytruda What should I tell my health care provider before I take this medicine? They need to know if you have any of these conditions:  diabetes  immune system problems  inflammatory bowel disease  liver disease  lung or breathing disease  lupus  received or scheduled to receive an organ transplant or a stem-cell transplant that uses donor stem cells  an unusual or allergic reaction to pembrolizumab, other medicines, foods, dyes, or preservatives  pregnant or trying to get pregnant  breast-feeding How should I use this medicine? This medicine is for infusion into a vein. It is given by a health care professional in a hospital or clinic setting. A special MedGuide will be given to you before each treatment. Be sure to read this information carefully each time. Talk to your pediatrician regarding the use of this medicine in children. While this drug may be prescribed for children as young as 6 months for selected conditions, precautions do apply. Overdosage: If you think you have taken too much of this medicine contact a poison control center or emergency room at once. NOTE: This medicine is only for you. Do not share this medicine with others. What if I miss a dose? It is important not to miss your dose. Call your doctor or health care professional if you are unable to keep an appointment. What may interact with this medicine? Interactions have not been studied. Give your health care provider a list of all the medicines, herbs, non-prescription drugs, or dietary supplements you use. Also tell them if you smoke, drink alcohol, or use illegal drugs. Some items may  interact with your medicine. This list may not describe all possible interactions. Give your health care provider a list of all the medicines, herbs, non-prescription drugs, or dietary supplements you use. Also tell them if you smoke, drink alcohol, or use illegal drugs. Some items may interact with your medicine. What should I watch for while using this medicine? Your condition will be monitored carefully while you are receiving this medicine. You may need blood work done while you are taking this medicine. Do not become pregnant while taking this medicine or for 4 months after stopping it. Women should inform their doctor if they wish to become pregnant or think they might be pregnant. There is a potential for serious side effects to an unborn child. Talk to your health care professional or pharmacist for more information. Do not breast-feed an infant while taking this medicine or for 4 months after the last dose. What side effects may I notice from receiving this medicine? Side effects that you should report to your doctor or health care professional as soon as possible:  allergic reactions like skin rash, itching or hives, swelling of the face, lips, or tongue  bloody or black, tarry  breathing problems  changes in vision  chest pain  chills  confusion  constipation  cough  diarrhea  dizziness or feeling faint or lightheaded  fast or irregular heartbeat  fever  flushing  joint pain  low blood counts - this medicine may decrease the number of white blood cells, red blood cells and platelets. You may be at increased risk for infections and bleeding.  muscle pain  muscle weakness  pain, tingling, numbness in the hands or feet  persistent headache  redness, blistering, peeling or loosening of the skin, including inside the mouth  signs and symptoms of high blood sugar such as dizziness; dry mouth; dry skin; fruity breath; nausea; stomach pain; increased hunger or  thirst; increased urination  signs and symptoms of kidney injury like trouble passing urine or change in the amount of urine  signs and symptoms of liver injury like dark urine, light-colored stools, loss of appetite, nausea, right upper belly pain, yellowing of the eyes or skin  sweating  swollen lymph nodes  weight loss Side effects that usually do not require medical attention (report to your doctor or health care professional if they continue or are bothersome):  decreased appetite  hair loss  muscle pain  tiredness This list may not describe all possible side effects. Call your doctor for medical advice about side effects. You may report side effects to FDA at 1-800-FDA-1088. Where should I keep my medicine? This drug is given in a hospital or clinic and will not be stored at home. NOTE: This sheet is a summary. It may not cover all possible information. If you have questions about this medicine, talk to your doctor, pharmacist, or health care provider.  2020 Elsevier/Gold Standard (2019-02-12 18:07:58)       Cabozantinib capsules and tablets What is this medicine? CABOZANTINIB (KA boe ZAN ti nib) is a medicine that targets proteins in cancer cells and stops the cancer cell from growing. The capsules are used to treat thyroid cancer; the tablets are used to treat renal cell cancer and hepatocellular cancer. This medicine may be used for other purposes; ask your health care provider or pharmacist if you have questions. COMMON BRAND NAME(S): Cabometyx, COMETRIQ What should I tell my health care provider before I take this medicine? They need to know if you have any of these conditions:  bleeding disorders  high blood pressure  liver disease  recent surgery  skin conditions or sensitivity  an unusual or allergic reaction to cabozantinib, other medicines, foods, dyes, or preservatives  pregnant or trying to get pregnant  breast-feeding How should I use this  medicine? Take this medicine by mouth with a glass of water. Follow the directions on the prescription label. Do not take with food. Do not cut, crush, or chew this medicine. Do not take with grapefruit juice. Take your medicine at regular intervals. Do not take it more often than directed. Do not stop taking except on your doctor's advice. Talk to your pediatrician regarding the use of this medicine in children. Special care may be needed. Overdosage: If you think you have taken too much of this medicine contact a poison control center or emergency room at once. NOTE: This medicine is only for you. Do not share this medicine with others. What if I miss a dose? If you miss a dose, take it as soon as you can. If your next dose is to be taken in less than 12 hours, then do not take the missed dose. Take the next dose at your regular time. Do not take double or extra doses. What may interact with this medicine? This medicine may interact with the following medications:  atazanavir  carbamazepine  clarithromycin  conivaptan  grapefruit juice  indinavir  itraconazole  ketoconazole  nefazodone  nelfinavir  phenobarbital  phenytoin  rifabutin  rifampin  rifapentine  ritonavir  saquinavir  St. John's Wort  telithromycin  voriconazole This list may not describe all possible interactions. Give your health care provider a list of all the medicines, herbs, non-prescription drugs, or dietary supplements you use. Also tell them if you smoke, drink alcohol, or use illegal drugs. Some items may interact with your medicine. What should I watch for while using this medicine? This drug may make you feel generally unwell. This is not uncommon, as chemotherapy can affect healthy cells as well as cancer cells. Report any side effects. Continue your course of treatment even though you feel ill unless your doctor tells you to stop. Before having surgery or dental work, talk to your health  care provider to make sure it is ok. This drug can increase the risk of poor healing of your surgical site or wound. You will need to stop this drug for 3 weeks before surgery. After surgery, wait at least 2 weeks weeks before restarting this drug. Make sure the surgical site or wound is healed enough before restarting this drug. Talk to your health care provider if questions. Do not become pregnant while taking this medicine or for 4 months after stopping it. Women should inform their doctor if they wish to become pregnant or think they might be pregnant. There is a potential for serious side effects to an unborn child. Talk to your health care professional or pharmacist for more information. Do not breast-feed an infant while taking this medicine or for 4 months after the last dose. This medicine may interfere with the ability to have a child. You should talk with your doctor or health care professional if you are concerned about your fertility. What side effects may I notice from receiving this medicine? Side effects that you should report to your doctor or health care professional as soon as possible:  allergic reactions like skin rash, itching or hives, swelling of the face, lips, or tongue  bloody or black, tarry stools  breathing problems  changes in vision  chest pain or chest tightness  confusion, trouble speaking or understanding  feeling faint or lightheaded, falls  jaw pain, especially after dental work  red or dark-brown urine  redness, swelling, or sores on hands or feet  severe headaches  shortness of breath, chest pain, swelling in a leg  spitting up blood or brown material that looks like coffee grounds  stomach pain  sudden numbness or weakness of the face, arm or leg  sweating  swelling of the ankles, feet, hands  trouble swallowing  trouble walking, dizziness, loss of balance or coordination  unusual bruising or bleeding from the eye, gums, or  nose Side effects that usually do not require medical attention (report to your doctor or health care professional if they continue or are bothersome):  diarrhea  loss of appetite  nausea, vomiting  sore throat  weight loss This list may not describe all possible side effects. Call your doctor for medical advice about side effects. You may report side effects to FDA at 1-800-FDA-1088. Where should I keep my medicine? Keep out of the reach of children. Store between 20 and 25 degrees C (68 and 77 degrees F). Throw away any unused medicine after the expiration date. NOTE: This sheet is a summary. It may not cover all possible information. If you have questions about this medicine, talk to your doctor, pharmacist, or health care provider.  2020 Elsevier/Gold Standard (2019-02-03 10:52:34)   Regorafenib tablets What is this medicine? REGORAFENIB (RE goe RAF e nib) is a medicine that targets  proteins in cancer cells and stops the cancer cells from growing. It is used to treat colorectal cancer, gastrointestinal stromal tumors (GIST), and liver cancer. This medicine may be used for other purposes; ask your health care provider or pharmacist if you have questions. COMMON BRAND NAME(S): Stivarga What should I tell my health care provider before I take this medicine? They need to know if you have any of these conditions:  bleeding disorders  heart disease  high blood pressure  liver disease  recent surgery  an unusual or allergic reaction to regorafenib, other medicines, foods, dyes, or preservatives  pregnant or trying to get pregnant  breast-feeding How should I use this medicine? Take this medicine by mouth with a glass of water. Follow the directions on the prescription label. Do not take it more often than directed. Take this medicine with food. Do not take with grapefruit juice. Do not stop taking except on your doctor's advice. Talk to your pediatrician regarding the use  of this medicine in children. Special care may be needed. Overdosage: If you think you have taken too much of this medicine contact a poison control center or emergency room at once. NOTE: This medicine is only for you. Do not share this medicine with others. What if I miss a dose? If you miss a dose, take it as soon as you can. If it is almost time for your next dose, take only that dose. Do not take double or extra doses. What may interact with this medicine? This medicine may interact with the following:  carbamazepine  irinotecan  itraconazole  ketoconazole  phenobarbital  phenytoin  posaconazole  rifampin  St. John's Wort  telithromycin  voriconazole  warfarin This list may not describe all possible interactions. Give your health care provider a list of all the medicines, herbs, non-prescription drugs, or dietary supplements you use. Also tell them if you smoke, drink alcohol, or use illegal drugs. Some items may interact with your medicine. What should I watch for while using this medicine? This drug may make you feel generally unwell. This is not uncommon, as chemotherapy can affect healthy cells as well as cancer cells. Report any side effects. Continue your course of treatment even though you feel ill unless your doctor tells you to stop. You may need blood work done while you are taking this medicine. Do not become pregnant while taking this medicine or for 2 months after stopping it. Women should inform their doctor if they wish to become pregnant or think they might be pregnant. Men should not father a child while taking this medicine and for 2 months after stopping it. There is a potential for serious side effects to an unborn child. Talk to your health care professional or pharmacist for more information. Do not breast-feed an infant while taking this medicine or for 2 weeks after stopping it. Before having surgery, talk to your health care provider to make sure it  is ok. This drug can increase the risk of poor healing of your surgical site or wound. You will need to stop this drug for 2 weeks before surgery. After surgery, wait at least 2 weeks before restarting this drug. Make sure the surgical site or wound is healed enough before restarting this drug. Talk to your health care provider if questions. Talk to your doctor about your risk of cancer. You may be more at risk for certain types of cancers if you take this medicine. What side effects may I notice from  receiving this medicine? Side effects that you should report to your doctor or health care professional as soon as possible:  allergic reactions like skin rash, itching or hives, swelling of the face, lips, or tongue  bloody or black, tarry stools  breathing problems  changes in vision  chest pain or chest tightness  confusion  dizziness  feeling faint or lightheaded  high fever  light-colored stools  nausea, vomiting  red or dark-brown urine  red spots on the skin  right upper belly pain  seizures  severe headache  sores on the hands or feet  spitting up blood or brown material that looks like coffee grounds  stomach pain  unusual bruising or bleeding from the eye, gums, or nose  unusually weak or tired  yellowing of the eyes or skin Side effects that usually do not require medical attention (report to your doctor or health care professional if they continue or are bothersome):  diarrhea  hoarseness  loss of appetite  sore throat  tiredness  weight loss This list may not describe all possible side effects. Call your doctor for medical advice about side effects. You may report side effects to FDA at 1-800-FDA-1088. Where should I keep my medicine? Keep out of the reach of children. Store between 20 and 25 degrees C (68 and 77 degrees F). Keep this medicine in the original container. Throw away any unused medicine 7 weeks after opening the bottle. NOTE:  This sheet is a summary. It may not cover all possible information. If you have questions about this medicine, talk to your doctor, pharmacist, or health care provider.  2020 Elsevier/Gold Standard (2019-02-03 11:22:08)

## 2019-08-29 DIAGNOSIS — R109 Unspecified abdominal pain: Secondary | ICD-10-CM | POA: Insufficient documentation

## 2019-08-30 ENCOUNTER — Other Ambulatory Visit: Payer: Self-pay | Admitting: Hematology and Oncology

## 2019-08-30 ENCOUNTER — Telehealth: Payer: Self-pay | Admitting: Pharmacist

## 2019-08-30 ENCOUNTER — Telehealth: Payer: Self-pay | Admitting: Pharmacy Technician

## 2019-08-30 DIAGNOSIS — C220 Liver cell carcinoma: Secondary | ICD-10-CM

## 2019-08-30 MED ORDER — CABOZANTINIB S-MALATE 20 MG PO TABS: 40.0000 mg | ORAL_TABLET | Freq: Every day | ORAL | 0 refills | Status: DC

## 2019-08-30 NOTE — Telephone Encounter (Signed)
Oral Oncology Patient Advocate Encounter  Received notification from [employer] that prior authorization for Cabometyx is required.  PA submitted on CoverMyMeds Key [id number] Status is pending  Oral Oncology Clinic will continue to follow.  [place] Patient [place] Phone [phone] Fax [phone] 08/31/2019 11:35 AM

## 2019-08-30 NOTE — Telephone Encounter (Signed)
Oral Oncology Pharmacist Encounter  Received new prescription for Cabometyx (cabozantinib) for the treatment of [place] planned duration until disease progression or unacceptable drug toxicity.  CMP from 08/12/19 assessed, no relevant lab abnormalities. Prescription dose and frequency assessed.   Current medication list in Epic reviewed, no relevant DDIs with cabozantinib identified.  Prescription has been e-scribed to the [facility] for benefits analysis and approval.  Oral Oncology Clinic will continue to follow for insurance authorization, copayment issues, initial counseling and start date.  Darl Pikes, PharmD, BCPS, BCOP, CPP Hematology/Oncology Clinical Pharmacist Practitioner [facility] [phone]  08/30/2019 4:31 PM

## 2019-08-31 ENCOUNTER — Encounter: Payer: Self-pay | Admitting: Pharmacy Technician

## 2019-08-31 NOTE — Telephone Encounter (Addendum)
Oral Oncology Patient Advocate Encounter  Prior Authorization for Cabometyx has been approved.    PA#  [id number] Effective dates: 08/31/2019 through 08/30/2020   Oral Oncology Clinic will continue to follow.   [place] Patient [place] Phone [phone] Fax [phone] 08/31/2019 3:26 PM

## 2019-08-31 NOTE — Telephone Encounter (Signed)
Erroneous encounter

## 2019-09-01 MED ORDER — CABOZANTINIB S-MALATE 20 MG PO TABS: 40.0000 mg | ORAL_TABLET | Freq: Every day | ORAL | 0 refills | Status: DC

## 2019-09-01 NOTE — Telephone Encounter (Signed)
Rx sent to [facility] per insurance.

## 2019-09-01 NOTE — Telephone Encounter (Signed)
Oral Chemotherapy Pharmacist Encounter  Due to insurance restriction the medication could not be filled at [facility]. Prescription has been e-scribed to [facility]  Supportive information was faxed to [facility]. We will continue to follow medication access.   Called and notified patient. He has used [facility] before.  Darl Pikes, PharmD, BCPS, [facility] Hematology/Oncology Clinical Pharmacist [facility] [phone]  09/01/2019 2:01 PM

## 2019-09-01 NOTE — Progress Notes (Signed)
[facility]  [address], [address] [place], [place] [address] Phone: [phone]  Fax: [phone]   Clinic Day:  09/06/2019  Referring physician: Donnie Coffin, MD  Chief Complaint: Chris Mcdonald is a 64 y.o. male with stage IIIBhepatocellular carcinomawho is seen for initiation of cabozantinib.   HPI: The patient was last seen in the medical oncology clinic on 08/27/2019. At that time, he felt better off lenvatinib.  Appetite had improved.  He had gained 1 pound. CBC was normal. AST was 58, ALT 61, and alkaline phosphatase 187. AFP was 612.0. He had stopped lenvatinib on 08/22/2019 secondary to poor tolerance.  We discussed other treatment options.  Decision was made to pursue cabozantinib.  During the interim, he has felt "good". His weight is up 6 pounds. He reports that he is eating better. The patient is drinking 2 Boost per day. He has mild right upper quadrant abdominal pain that comes and goes. He is not taking any nausea medication. He will be getting his new pills (cabozantinib) today; he reports that he will be getting a 2 week supply.  I discussed the risk and benefits of his new medication. I informed the patient that if the new medication is poorly tolerated or he progresses on therapy, we will pursue IV immunotherapy and have a port placed.  Patient understood and was in agreement.  His BP was 120/78 today. I advised him to monitor his BP daily since he has a blood pressure cuff at home.  Cabozantinib can cause hypertension.   Past Medical History:  Diagnosis Date  . Arthritis   . Hepatitis   . Hypertension   . Liver cancer ([place])   . Spinal stenosis   . Stroke [facility])     Past Surgical History:  Procedure Laterality Date  . COLONOSCOPY WITH PROPOFOL N/A 02/21/2015   Procedure: COLONOSCOPY WITH PROPOFOL;  Surgeon: Lollie Sails, MD;  Location: [facility] ENDOSCOPY;  Service: Endoscopy;  Laterality: N/A;  . INGUINAL HERNIA REPAIR Bilateral   .  IR RADIOLOGIST EVAL & MGMT  08/04/2018  . POSTERIOR CERVICAL VERTEBRAE EXCISION      Family History  Problem Relation Age of Onset  . Cancer Brother   . Heart disease Brother   . Cancer Mother     Social History:  reports that he has been smoking cigarettes. He has a 75.00 pack-year smoking history. He has never used smokeless tobacco. He reports that he does not drink alcohol or use drugs. Patient lives in [place] with his wife and daughter.He has a 1 year old daughter, 31 year old son, and 48 year old son. The patient is alone today.   Allergies: No Known Allergies  Current Medications: Current Outpatient Medications  Medication Sig Dispense Refill  . aspirin EC 81 MG tablet Take 81 mg by mouth daily.     Marland Kitchen atorvastatin (LIPITOR) 40 MG tablet Take 1 tablet (40 mg total) by mouth daily at 6 PM. 30 tablet 0  . clopidogrel (PLAVIX) 75 MG tablet Take 1 tablet (75 mg total) by mouth daily. 30 tablet 0  . fluticasone (FLONASE) 50 MCG/ACT nasal spray 1 spray by Each Nare route daily.    Marland Kitchen gabapentin (NEURONTIN) 100 MG capsule Take 1 capsule (100 mg total) by mouth 2 (two) times daily. 60 capsule 0  . hydrochlorothiazide (HYDRODIURIL) 12.5 MG tablet Take 25 mg by mouth daily.     . mupirocin ointment (BACTROBAN) 2 % Apply twice a day to umbilical area  22 g 0  . omeprazole (PRILOSEC) 20 MG capsule TAKE 1 CAPSULE BY MOUTH EVERY DAY 90 capsule 1  . potassium chloride SA (KLOR-CON M20) 20 MEQ tablet TAKE 1 TABLET (20 MEQ TOTAL) BY MOUTH DAILY. AS DIRECTED BY MD 40 tablet 0  . cabozantinib (CABOMETYX) 20 MG tablet Take 2 tablets (40 mg total) by mouth daily. Take on an empty stomach, 1 hour before or 2 hours after meals. 60 tablet 0  . nicotine (NICODERM CQ - DOSED IN MG/24 HOURS) 14 mg/24hr patch aply one patch chest wall daily (can substitute generic patch) (Patient not taking: Reported on 08/11/2019) 28 patch 0  . ondansetron (ZOFRAN) 8 MG tablet Take 1 tablet (8 mg total) by mouth every 8 (eight)  hours as needed for nausea or vomiting. (Patient not taking: Reported on 08/27/2019) 20 tablet 0   No current facility-administered medications for this visit.    Review of Systems  Constitutional: Negative for chills, fever, malaise/fatigue and weight loss (up 6 lbs).       Feels "good".  HENT: Negative.  Negative for congestion, ear pain, hearing loss, nosebleeds, sinus pain, sore throat and tinnitus.   Eyes: Negative.  Negative for blurred vision and double vision.  Respiratory: Negative.  Negative for cough, hemoptysis, sputum production and shortness of breath.   Cardiovascular: Negative.  Negative for chest pain, palpitations and leg swelling.  Gastrointestinal: Positive for abdominal pain (mild, RUQ, comes and goes). Negative for blood in stool, constipation, diarrhea, heartburn, melena, nausea (controlled on medication) and vomiting.       Eating better.  Drinking Boost x 2 per day.  Genitourinary: Negative.  Negative for dysuria, frequency and urgency.  Musculoskeletal: Negative.  Negative for back pain, falls, myalgias and neck pain.  Skin: Negative.  Negative for rash.  Neurological: Negative.  Negative for tingling, tremors, sensory change, focal weakness, weakness and headaches.  Endo/Heme/Allergies: Negative.  Does not bruise/bleed easily.  Psychiatric/Behavioral: Negative.  Negative for depression. The patient is not nervous/anxious and does not have insomnia.   All other systems reviewed and are negative.  Performance status (ECOG):  1  Vitals Blood pressure 120/78, pulse 73, temperature (!) 95.7 F (35.4 C), temperature source Tympanic, weight 115 lb 4.8 oz (52.3 kg), SpO2 100 %.   Physical Exam  Constitutional: He is oriented to person, place, and time. He appears well-developed and well-nourished. No distress.  HENT:  Head: Normocephalic and atraumatic.  Mouth/Throat: Oropharynx is clear and moist. No oropharyngeal exudate.  Short brown hair.  Edentulous.  Temporal  wasting.  Mask.  Eyes: Pupils are equal, round, and reactive to light. Conjunctivae and EOM are normal. No scleral icterus.  Blue eyes.  Neck: No JVD present.  Cardiovascular: Normal rate, regular rhythm and normal heart sounds. Exam reveals no gallop.  No murmur heard. Pulmonary/Chest: Effort normal and breath sounds normal. No respiratory distress. He has no wheezes. He has no rales.  Abdominal: Soft. Bowel sounds are normal. He exhibits no distension and no mass. There is no splenomegaly or hepatomegaly. There is no abdominal tenderness. There is no rebound and no guarding.  Musculoskeletal:        General: No tenderness or edema. Normal range of motion.     Cervical back: Normal range of motion and neck supple.  Lymphadenopathy:       Head (right side): No preauricular, no posterior auricular and no occipital adenopathy present.       Head (left side): No preauricular, no posterior auricular  and no occipital adenopathy present.    He has no cervical adenopathy.    He has no axillary adenopathy.       Right: No inguinal and no supraclavicular adenopathy present.       Left: No inguinal and no supraclavicular adenopathy present.  Neurological: He is alert and oriented to person, place, and time.  Skin: Skin is warm and dry. No rash noted. He is not diaphoretic. No erythema. No pallor.  Psychiatric: He has a normal mood and affect. His behavior is normal. Judgment and thought content normal.  Nursing note and vitals reviewed.   Imaging studies: 06/19/2019:Low dose chest CTrevealed Lung-RADS 4A-S, suspicious. There was a new centrilobular nodularity in both lungs, more likely inflammatory. Three was a 8.5 x 5.7 cm poorly marginated geographic region of low attenuation in the superior right liver, new since 06/17/2017.  09/16/2018:Chest CTrevealed aLung-RADSscore of2, benign appearance or behavior. There was an ill-defined liver mass felt to represent a hepatoma on 07/10/2018, in  the setting of cirrhosis. 07/10/2018:Abdomen MRI revealed an 8.8 cm heterogeneous infiltrating segment 8. Findings were consistent with a hepatoma, given the underlying cirrhotic changes involving the liver. This was also invading and obstructing the middle and right portal veins and the middle hepatic vein. There were scattered abdominal lymph nodes but no definite findings for metastatic disease. 11/28/2018:Abdominal MRI revealedresponse to therapy of segment 8 hepatocellular carcinoma with persistent tumor thrombus extension into the right portal vein. There was no evidence of new liver lesions or abdominal metastasis. 03/15/2019:Abdominal MRIrevealed no significant change in poorly defined mass in segment 8 of the right lobe, with persistent tumor thrombus throughout the right portal vein. There was no new liver lesions or abdominal metastatic disease. 06/15/2019:Abdominal MRIrevealedslight increase in peripheral biliary ductal dilation surrounding the infiltrative central hepatic neoplasm. Portal vein involvement may be slightly improved compared to the prior study. There was no significant change in size or new lesion.There was suggestion of subcarinal adenopathy though assessment of the chest on MRIwas limited. Consider chest CT for further evaluation. There was question of colonic thickening,whichcould herald mild colitis. Correlate clinically. 08/26/2019:  Abdominal MRI revealed a branching, irregular hypoenhancing mass of the right lobe of the liver with associated atrophy of the right lobe, biliary ductal dilatation and right portal vein invasion/occlusion. The bulk and configuration of the mass was best appreciated on coronal images, with the largest components located very centrally and in the subcapsular dome of the right lobe.  The mass was not significantly changed. There was diffuse, somewhat heterogeneous arterial hyperenhancement of the right lobe of the liver, likely  due to perfusion anomaly given occlusion of the right portal vein. There were multiple subcentimeter arterially enhancing foci throughout the left lobe of liver, without evidence of discrete mass, washout, or capsule.    Appointment on 09/06/2019  Component Date Value Ref Range Status  . WBC 09/06/2019 5.1  4.0 - 10.5 K/uL Final  . RBC 09/06/2019 3.97* 4.22 - 5.81 MIL/uL Final  . Hemoglobin 09/06/2019 12.6* 13.0 - 17.0 g/dL Final  . HCT 09/06/2019 38.7* 39.0 - 52.0 % Final  . MCV 09/06/2019 97.5  80.0 - 100.0 fL Final  . MCH 09/06/2019 31.7  26.0 - 34.0 pg Final  . MCHC 09/06/2019 32.6  30.0 - 36.0 g/dL Final  . RDW 09/06/2019 14.2  11.5 - 15.5 % Final  . Platelets 09/06/2019 172  150 - 400 K/uL Final  . nRBC 09/06/2019 0.0  0.0 - 0.2 % Final  .  Neutrophils Relative % 09/06/2019 68  % Final  . Neutro Abs 09/06/2019 3.5  1.7 - 7.7 K/uL Final  . Lymphocytes Relative 09/06/2019 20  % Final  . Lymphs Abs 09/06/2019 1.0  0.7 - 4.0 K/uL Final  . Monocytes Relative 09/06/2019 8  % Final  . Monocytes Absolute 09/06/2019 0.4  0.1 - 1.0 K/uL Final  . Eosinophils Relative 09/06/2019 3  % Final  . Eosinophils Absolute 09/06/2019 0.2  0.0 - 0.5 K/uL Final  . Basophils Relative 09/06/2019 1  % Final  . Basophils Absolute 09/06/2019 0.0  0.0 - 0.1 K/uL Final  . Immature Granulocytes 09/06/2019 0  % Final  . Abs Immature Granulocytes 09/06/2019 0.01  0.00 - 0.07 K/uL Final   Performed at [facility], [address] [place], [place] [address]  . Sodium 09/06/2019 138  135 - 145 mmol/L Final  . Potassium 09/06/2019 3.5  3.5 - 5.1 mmol/L Final  . Chloride 09/06/2019 103  98 - 111 mmol/L Final  . CO2 09/06/2019 26  22 - 32 mmol/L Final  . Glucose, Bld 09/06/2019 80  70 - 99 mg/dL Final   Glucose reference range applies only to samples taken after fasting for at least 8 hours.  . BUN 09/06/2019 17  8 - 23 mg/dL Final  . Creatinine, Ser 09/06/2019 0.59* 0.61 - 1.24 mg/dL Final  . Calcium  09/06/2019 9.1  8.9 - 10.3 mg/dL Final  . Total Protein 09/06/2019 7.3  6.5 - 8.1 g/dL Final  . Albumin 09/06/2019 3.6  3.5 - 5.0 g/dL Final  . AST 09/06/2019 86* 15 - 41 U/L Final  . ALT 09/06/2019 109* 0 - 44 U/L Final  . Alkaline Phosphatase 09/06/2019 253* 38 - 126 U/L Final  . Total Bilirubin 09/06/2019 1.0  0.3 - 1.2 mg/dL Final  . GFR calc non Af Amer 09/06/2019 >60  >60 mL/min Final  . GFR calc Af Amer 09/06/2019 >60  >60 mL/min Final  . Anion gap 09/06/2019 9  5 - 15 Final   Performed at [facility], [address] [place], [place] [address]    Assessment:  Chris Mcdonald is a 64 y.o. male with unresectable hepatocellular carcinoma. He has a history of hepatitis C(treated with Harvoni in 04/11/2015). He has hepatitis B. Mass was discovered incidentally on low dose chest CT screenig program. AFPwas 7329. Child-Pugh ClassisA (score 5).  Low dose chest CTon 06/18/2018 revealed Lung-RADS 4A-S, suspicious. There was a new centrilobular nodularity in both lungs, more likely inflammatory. Three was a 8.5 x 5.7 cm poorly marginated geographic region of low attenuation in the superior right liver, new since 06/17/2017. Chest CTon 09/16/2018 revealed aLung-RADSscore of2, benign appearance or behavior. There was an ill-defined liver mass felt to represent a hepatoma on 07/10/2018, in the setting of cirrhosis.  Abdomen MRI on 07/10/2018 revealed an 8.8 cm heterogeneous infiltrating segment 8. Findings were consistent with a hepatoma, given the underlying cirrhotic changes involving the liver. This was also invading and obstructing the middle and right portal veins and the middle hepatic vein. There were scattered abdominal lymph nodes but no definite findings for metastatic disease.  AFPhas been followed: [id number] on 07/27/2018,6250 on 08/17/2018, 467 on 11/18/2018, 271 on 12/29/2018, 2320 on 02/08/2019, 1548 on 03/08/2019, 721 on 04/07/2019, 500 on 05/05/2019,  501.0 on 06/14/2019, 642 on 07/08/2019, 603 on 07/15/2019, and 612 on 08/12/2019.  He began lenvatinibon 08/25/2018.Hewasoff lenvatinibfrom09/09/2018 - 02/14/2019.Lenvatinib was decreased to 4 mg a day on 06/17/2019.  He discontinued lenvatinib on 08/22/2019 secondary to intolerance.  Abdominal MRI on 08/26/2019 revealed a branching, irregular hypoenhancing mass of the right lobe of the liver with associated atrophy of the right lobe, biliary ductal dilatation and right portal vein invasion/occlusion. The mass was not significantly changed. There was diffuse, somewhat heterogeneous arterial hyperenhancement of the right lobe of the liver, likely due to perfusion anomaly. There were multiple subcentimeter arterially enhancing foci throughout the left lobe of liver, without evidence of discrete mass, washout, or capsule.   He has a history of hypercalcemia. Calcium was 11.0 on 08/17/2018. PTH and PTH-rp were low on 08/19/2018.Calcium was 9.3 on 11/30/2018.  EKG on 06/17/2019 revealed no increased QTc.  Symptomatically, he has felt good after discontinuation of lenvatinib.  He is eating and gaining weight.  He has rare right upper quadrant pain.  He denies any nausea.  Exam is stable.  Plan: 1.   Labs today:  CBC with diff, CMP. 2.   Hepatocellular carcinoma Clinically, he is doing well off lenvatinib.   He stopped taking lenvatinib on 08/22/2019. AFPinitiallyimproved from [id number] to 721 to 500then stablizedover past34months.   AFP was 612 on 08/12/2019. Abdominal MRIon 08/26/2019 revealed a stable mass.  He remains Child Pugh class A (score 5).  Review plan for cabozantinib.   Side effects reviewed.  Patient consented to treatment.   Discussed low risk of potential for thrombosis (2% risk of arterial thromboembolism, 4% pulmonary embolism and 6% venous thromboembolism).   Discuss initiation of low-dose cabozantinib (40 mg/day) rather than 60  mg a day secondary to prior tolerance of medications.    Advance dose as tolerated.Nuala Alpha, pharmacist, confirmed that patient will receive cabozantinib in 2-week prescriptions per his insurance.  If unable to proceed with TKI, anticipate immunotherapy.  Discuss symptom management.  He has antiemetics and pain medications at home to use on a prn bases.  Interventions are adequate.    3. Abdominal discomfort He has intermittent RUQ (liver) and epigastric discomfort/twinge.  Symptoms are intermittent and likely associated with hepatocellular carcinoma.  Previously he noted significant abdominal pain with lenvatinib (known side effect).  No pain medications needed.  Continue to monitor. 4. Weight loss  Weight up 4 pounds after discontinuation of lenvatinib.  Continue to encourage caloric intake as well as supplements of Ensure and Boost. 5.Hypertension Blood pressure120/78 off lenvatinib. He has baseline hypertension.                          He is onHCTZ 25 mg a day and amlodipine 10 mg a day. Patient to monitor his blood pressure at home given risk of hypertension associated with cabozantinib. 6.   RN to provide patient with Ensure/Boost. 7.   RN to call Nuala Alpha, pharmacist, re: cabozantinib supply. 8.   RTC in 1 week for brief MD assessment and labs (CBC with diff, CMP, Phos, Mg, TSH).  I discussed the assessment and treatment plan with the patient.  The patient was provided an opportunity to ask questions and all were answered.  The patient agreed with the plan and demonstrated an understanding of the instructions.  The patient was advised to call back if the symptoms worsen or if the condition fails to improve as anticipated.   Lequita Asal, MD, PhD    09/06/2019, 8:44 AM  I, Selena Batten, am acting as a scribe for [employer]. Mike Gip, MD.   I, Keo Schirmer C. Mike Gip, MD,  have reviewed the  above documentation for accuracy and completeness, and I agree with the above.

## 2019-09-02 ENCOUNTER — Telehealth: Payer: Self-pay | Admitting: Pharmacy Technician

## 2019-09-02 NOTE — Telephone Encounter (Signed)
Oral Oncology Patient Advocate Encounter   Was successful in obtaining a copay card for Cabometyx.  This copay card will make the patients copay $0.00.  I have spoken with the patient.    The billing information is as follows and has been shared with [facility].   RxBin: [id number] PCN: [id number]  Member ID: [id number] Group ID: [id number]   Chris Mcdonald [name] Phone [phone] Fax [phone] 09/02/2019 11:59 AM

## 2019-09-03 ENCOUNTER — Encounter: Payer: Self-pay | Admitting: Hematology and Oncology

## 2019-09-03 ENCOUNTER — Other Ambulatory Visit: Payer: Self-pay

## 2019-09-06 ENCOUNTER — Telehealth: Payer: Self-pay | Admitting: *Deleted

## 2019-09-06 ENCOUNTER — Inpatient Hospital Stay (HOSPITAL_BASED_OUTPATIENT_CLINIC_OR_DEPARTMENT_OTHER): Payer: BC Managed Care – PPO | Admitting: Hematology and Oncology

## 2019-09-06 ENCOUNTER — Other Ambulatory Visit: Payer: Self-pay

## 2019-09-06 ENCOUNTER — Inpatient Hospital Stay: Payer: BC Managed Care – PPO

## 2019-09-06 VITALS — BP 120/78 | HR 73 | Temp 95.7°F | Wt 115.3 lb

## 2019-09-06 DIAGNOSIS — R1013 Epigastric pain: Secondary | ICD-10-CM

## 2019-09-06 DIAGNOSIS — C220 Liver cell carcinoma: Secondary | ICD-10-CM | POA: Diagnosis not present

## 2019-09-06 DIAGNOSIS — R634 Abnormal weight loss: Secondary | ICD-10-CM

## 2019-09-06 DIAGNOSIS — Z7189 Other specified counseling: Secondary | ICD-10-CM | POA: Diagnosis not present

## 2019-09-06 DIAGNOSIS — I639 Cerebral infarction, unspecified: Secondary | ICD-10-CM

## 2019-09-06 LAB — COMPREHENSIVE METABOLIC PANEL
ALT: 109 U/L — ABNORMAL HIGH (ref 0–44)
AST: 86 U/L — ABNORMAL HIGH (ref 15–41)
Albumin: 3.6 g/dL (ref 3.5–5.0)
Alkaline Phosphatase: 253 U/L — ABNORMAL HIGH (ref 38–126)
Anion gap: 9 (ref 5–15)
BUN: 17 mg/dL (ref 8–23)
CO2: 26 mmol/L (ref 22–32)
Calcium: 9.1 mg/dL (ref 8.9–10.3)
Chloride: 103 mmol/L (ref 98–111)
Creatinine, Ser: 0.59 mg/dL — ABNORMAL LOW (ref 0.61–1.24)
GFR calc Af Amer: >60 mL/min (ref >60)
GFR calc non Af Amer: >60 mL/min (ref >60)
Glucose, Bld: 80 mg/dL (ref 70–99)
Potassium: 3.5 mmol/L (ref 3.5–5.1)
Sodium: 138 mmol/L (ref 135–145)
Total Bilirubin: 1 mg/dL (ref 0.3–1.2)
Total Protein: 7.3 g/dL (ref 6.5–8.1)

## 2019-09-06 LAB — CBC WITH DIFFERENTIAL/PLATELET
Abs Immature Granulocytes: 0.01 10*3/uL (ref 0.00–0.07)
Basophils Absolute: 0 10*3/uL (ref 0.0–0.1)
Basophils Relative: 1 %
Eosinophils Absolute: 0.2 10*3/uL (ref 0.0–0.5)
Eosinophils Relative: 3 %
HCT: 38.7 % — ABNORMAL LOW (ref 39.0–52.0)
Hemoglobin: 12.6 g/dL — ABNORMAL LOW (ref 13.0–17.0)
Immature Granulocytes: 0 %
Lymphocytes Relative: 20 %
Lymphs Abs: 1 10*3/uL (ref 0.7–4.0)
MCH: 31.7 pg (ref 26.0–34.0)
MCHC: 32.6 g/dL (ref 30.0–36.0)
MCV: 97.5 fL (ref 80.0–100.0)
Monocytes Absolute: 0.4 10*3/uL (ref 0.1–1.0)
Monocytes Relative: 8 %
Neutro Abs: 3.5 10*3/uL (ref 1.7–7.7)
Neutrophils Relative %: 68 %
Platelets: 172 10*3/uL (ref 150–400)
RBC: 3.97 MIL/uL — ABNORMAL LOW (ref 4.22–5.81)
RDW: 14.2 % (ref 11.5–15.5)
WBC: 5.1 10*3/uL (ref 4.0–10.5)
nRBC: 0 % (ref 0.0–0.2)

## 2019-09-06 NOTE — Telephone Encounter (Signed)
Spoke with Jeral Fruit at [facility] [place] regarding patient.  Received explanation that patient's insurance would only allow prescription of cabozantinib to be dispensed in 14 day supply for the first 3 months. RN also notified pharmacy that per MD the best number to call is our office number for refill reminds.  Pharmacy given office number [phone]. RN notified Dr Mike Gip of conversation and outcome.

## 2019-09-07 NOTE — Telephone Encounter (Signed)
Per Candice, [employer] rep, Cabometyx was delivered to the patient on 09/06/19.

## 2019-09-12 NOTE — Progress Notes (Signed)
[facility]  [address] [place], [place] [address] Phone: [phone]  Fax: [phone]   Clinic Day:  09/13/2019  Referring physician: Donnie Coffin, MD  Chief Complaint: Chris Mcdonald is a 64 y.o. male with stage IIIBhepatocellular carcinomawho is seen for a 1 week assessment on cabozantinib.   HPI: The patient was last seen in the medical oncology clinic on 09/06/2019. At that time, he had felt good after discontinuation of lenvatinib. He was eating and gaining weight (up 4 pounds). He had rare right upper quadrant pain. He denied any nausea. Exam was stable. Hematocrit 38.7, hemoglobin 12.6, platelets 172,000, WBC 5,100. Creatinine was 0.59. AST was 86. ALT was 109. I encouraged him to continue drinking Ensure or Boost BID. Blood pressure was 120/78 while off lenvatinib. Patient was advised to monitor his BP at home given risk of hypertension associated with cabozantinib.   During the interim, he has not been feeling well. Reports he started taking cabozantinib on 09/06/2019 and on 09/11/2019 started vomiting. Reports he has had no appetite and is unable to keep anything down. He ran out of Zofran. Patient has not taken any meds due to this. He denies any diarrhea. He notes a little abdominal pain.    Past Medical History:  Diagnosis Date  . Arthritis   . Hepatitis   . Hypertension   . Liver cancer ([place])   . Spinal stenosis   . Stroke [facility])     Past Surgical History:  Procedure Laterality Date  . COLONOSCOPY WITH PROPOFOL N/A 02/21/2015   Procedure: COLONOSCOPY WITH PROPOFOL;  Surgeon: Lollie Sails, MD;  Location: [facility] ENDOSCOPY;  Service: Endoscopy;  Laterality: N/A;  . INGUINAL HERNIA REPAIR Bilateral   . IR RADIOLOGIST EVAL & MGMT  08/04/2018  . POSTERIOR CERVICAL VERTEBRAE EXCISION      Family History  Problem Relation Age of Onset  . Cancer Brother   . Heart disease Brother   . Cancer Mother     Social History:   reports that he has been smoking cigarettes. He has a 75.00 pack-year smoking history. He has never used smokeless tobacco. He reports that he does not drink alcohol or use drugs. Patient lives in [place] with his wife and daughter.He has a 62 year old daughter, 48 year old son, and 19 year old son. The patient is alone today.   Allergies: No Known Allergies  Current Medications: Current Outpatient Medications  Medication Sig Dispense Refill  . aspirin EC 81 MG tablet Take 81 mg by mouth daily.     Marland Kitchen atorvastatin (LIPITOR) 40 MG tablet Take 1 tablet (40 mg total) by mouth daily at 6 PM. 30 tablet 0  . cabozantinib (CABOMETYX) 20 MG tablet Take 2 tablets (40 mg total) by mouth daily. Take on an empty stomach, 1 hour before or 2 hours after meals. 60 tablet 0  . clopidogrel (PLAVIX) 75 MG tablet Take 1 tablet (75 mg total) by mouth daily. 30 tablet 0  . fluticasone (FLONASE) 50 MCG/ACT nasal spray 1 spray by Each Nare route daily.    Marland Kitchen gabapentin (NEURONTIN) 100 MG capsule Take 1 capsule (100 mg total) by mouth 2 (two) times daily. 60 capsule 0  . hydrochlorothiazide (HYDRODIURIL) 12.5 MG tablet Take 25 mg by mouth daily.     . mupirocin ointment (BACTROBAN) 2 % Apply twice a day to umbilical area 22 g 0  . omeprazole (PRILOSEC) 20 MG capsule TAKE 1 CAPSULE BY MOUTH EVERY DAY  90 capsule 1  . potassium chloride SA (KLOR-CON M20) 20 MEQ tablet TAKE 1 TABLET (20 MEQ TOTAL) BY MOUTH DAILY. AS DIRECTED BY MD 40 tablet 0  . ondansetron (ZOFRAN) 8 MG tablet Take 1 tablet (8 mg total) by mouth every 8 (eight) hours as needed for nausea or vomiting. (Patient not taking: Reported on 08/27/2019) 20 tablet 0   No current facility-administered medications for this visit.    Review of Systems  Constitutional: Positive for weight loss (7 lbs). Negative for chills, fever and malaise/fatigue.       Not feeling well.  HENT: Negative.  Negative for congestion, ear pain, hearing loss, nosebleeds, sinus pain, sore  throat and tinnitus.   Eyes: Negative.  Negative for blurred vision and double vision.  Respiratory: Negative.  Negative for cough, hemoptysis, sputum production and shortness of breath.   Cardiovascular: Negative.  Negative for chest pain, palpitations and leg swelling.  Gastrointestinal: Positive for abdominal pain, nausea (controlled on medication) and vomiting. Negative for blood in stool, constipation, diarrhea, heartburn and melena.       No appetite.  Drinking Boost x 2 per day.  Genitourinary: Negative.  Negative for dysuria, frequency and urgency.  Musculoskeletal: Negative.  Negative for back pain, falls, myalgias and neck pain.  Skin: Negative.  Negative for rash.  Neurological: Negative.  Negative for tingling, tremors, sensory change, focal weakness, weakness and headaches.  Endo/Heme/Allergies: Negative.  Does not bruise/bleed easily.  Psychiatric/Behavioral: Negative.  Negative for depression. The patient is not nervous/anxious and does not have insomnia.   All other systems reviewed and are negative.  Performance status (ECOG):  1  Vitals Blood pressure (!) 133/98, pulse 84, temperature (!) 95.4 F (35.2 C), temperature source Tympanic, resp. rate 16, weight 108 lb 0.4 oz (49 kg), SpO2 100 %.   Physical Exam  Constitutional: He is oriented to person, place, and time. He appears well-developed and well-nourished. No distress.  HENT:  Head: Normocephalic and atraumatic.  Mouth/Throat: Oropharynx is clear and moist. No oropharyngeal exudate.  Short brown hair.  Edentulous.  Temporal wasting.  Mask.  Eyes: Pupils are equal, round, and reactive to light. Conjunctivae and EOM are normal. No scleral icterus.  Blue eyes.  Neck: No JVD present.  Cardiovascular: Normal rate, regular rhythm and normal heart sounds. Exam reveals no gallop.  No murmur heard. Pulmonary/Chest: Effort normal and breath sounds normal. No respiratory distress. He has no wheezes. He has no rales.    Abdominal: Soft. Bowel sounds are normal. He exhibits no distension and no mass. There is no splenomegaly or hepatomegaly. There is no abdominal tenderness. There is no rebound and no guarding.  Musculoskeletal:        General: No tenderness or edema. Normal range of motion.     Cervical back: Normal range of motion and neck supple.  Lymphadenopathy:       Head (right side): No preauricular, no posterior auricular and no occipital adenopathy present.       Head (left side): No preauricular, no posterior auricular and no occipital adenopathy present.    He has no cervical adenopathy.    He has no axillary adenopathy.       Right: No supraclavicular adenopathy present.       Left: No supraclavicular adenopathy present.  Neurological: He is alert and oriented to person, place, and time.  Skin: Skin is warm and dry. No rash noted. He is not diaphoretic. No erythema. No pallor.  Psychiatric: He has a  normal mood and affect. His behavior is normal. Judgment and thought content normal.  Nursing note and vitals reviewed.   Imaging studies: 06/19/2019:Low dose chest CTrevealed Lung-RADS 4A-S, suspicious. There was a new centrilobular nodularity in both lungs, more likely inflammatory. Three was a 8.5 x 5.7 cm poorly marginated geographic region of low attenuation in the superior right liver, new since 06/17/2017.  09/16/2018:Chest CTrevealed aLung-RADSscore of2, benign appearance or behavior. There was an ill-defined liver mass felt to represent a hepatoma on 07/10/2018, in the setting of cirrhosis. 07/10/2018:Abdomen MRI revealed an 8.8 cm heterogeneous infiltrating segment 8. Findings were consistent with a hepatoma, given the underlying cirrhotic changes involving the liver. This was also invading and obstructing the middle and right portal veins and the middle hepatic vein. There were scattered abdominal lymph nodes but no definite findings for metastatic  disease. 11/28/2018:Abdominal MRI revealedresponse to therapy of segment 8 hepatocellular carcinoma with persistent tumor thrombus extension into the right portal vein. There was no evidence of new liver lesions or abdominal metastasis. 03/15/2019:Abdominal MRIrevealed no significant change in poorly defined mass in segment 8 of the right lobe, with persistent tumor thrombus throughout the right portal vein. There was no new liver lesions or abdominal metastatic disease. 06/15/2019:Abdominal MRIrevealedslight increase in peripheral biliary ductal dilation surrounding the infiltrative central hepatic neoplasm. Portal vein involvement may be slightly improved compared to the prior study. There was no significant change in size or new lesion.There was suggestion of subcarinal adenopathy though assessment of the chest on MRIwas limited. Consider chest CT for further evaluation. There was question of colonic thickening,whichcould herald mild colitis. Correlate clinically. 08/26/2019:  Abdominal MRI revealed a branching, irregular hypoenhancing mass of the right lobe of the liver with associated atrophy of the right lobe, biliary ductal dilatation and right portal vein invasion/occlusion. The bulk and configuration of the mass was best appreciated on coronal images, with the largest components located very centrally and in the subcapsular dome of the right lobe.  The mass was not significantly changed. There was diffuse, somewhat heterogeneous arterial hyperenhancement of the right lobe of the liver, likely due to perfusion anomaly given occlusion of the right portal vein. There were multiple subcentimeter arterially enhancing foci throughout the left lobe of liver, without evidence of discrete mass, washout, or capsule.    Appointment on 09/13/2019  Component Date Value Ref Range Status  . Phosphorus 09/13/2019 3.5  2.5 - 4.6 mg/dL Final   Performed at [facility], [address] [place], [place] [address]  . Magnesium 09/13/2019 2.1  1.7 - 2.4 mg/dL Final   Performed at [facility], [address] [place], [place] [address]  . Sodium 09/13/2019 135  135 - 145 mmol/L Final  . Potassium 09/13/2019 3.9  3.5 - 5.1 mmol/L Final  . Chloride 09/13/2019 99  98 - 111 mmol/L Final  . CO2 09/13/2019 24  22 - 32 mmol/L Final  . Glucose, Bld 09/13/2019 90  70 - 99 mg/dL Final   Glucose reference range applies only to samples taken after fasting for at least 8 hours.  . BUN 09/13/2019 29* 8 - 23 mg/dL Final  . Creatinine, Ser 09/13/2019 0.81  0.61 - 1.24 mg/dL Final  . Calcium 09/13/2019 9.2  8.9 - 10.3 mg/dL Final  . Total Protein 09/13/2019 8.3* 6.5 - 8.1 g/dL Final  . Albumin 09/13/2019 4.0  3.5 - 5.0 g/dL Final  . AST 09/13/2019 111* 15 - 41 U/L Final  . ALT  09/13/2019 143* 0 - 44 U/L Final  . Alkaline Phosphatase 09/13/2019 390* 38 - 126 U/L Final  . Total Bilirubin 09/13/2019 0.9  0.3 - 1.2 mg/dL Final  . GFR calc non Af Amer 09/13/2019 >60  >60 mL/min Final  . GFR calc Af Amer 09/13/2019 >60  >60 mL/min Final  . Anion gap 09/13/2019 12  5 - 15 Final   Performed at [facility], [address] [place], [place] [address]  . WBC 09/13/2019 7.0  4.0 - 10.5 K/uL Final  . RBC 09/13/2019 5.14  4.22 - 5.81 MIL/uL Final  . Hemoglobin 09/13/2019 15.9  13.0 - 17.0 g/dL Final  . HCT 09/13/2019 47.9  39.0 - 52.0 % Final  . MCV 09/13/2019 93.2  80.0 - 100.0 fL Final  . MCH 09/13/2019 30.9  26.0 - 34.0 pg Final  . MCHC 09/13/2019 33.2  30.0 - 36.0 g/dL Final  . RDW 09/13/2019 14.0  11.5 - 15.5 % Final  . Platelets 09/13/2019 218  150 - 400 K/uL Final  . nRBC 09/13/2019 0.0  0.0 - 0.2 % Final  . Neutrophils Relative % 09/13/2019 70  % Final  . Neutro Abs 09/13/2019 4.9  1.7 - 7.7 K/uL Final  . Lymphocytes Relative 09/13/2019 26  % Final  . Lymphs Abs 09/13/2019 1.8  0.7 - 4.0 K/uL Final  . Monocytes Relative 09/13/2019 3  % Final  .  Monocytes Absolute 09/13/2019 0.2  0.1 - 1.0 K/uL Final  . Eosinophils Relative 09/13/2019 1  % Final  . Eosinophils Absolute 09/13/2019 0.0  0.0 - 0.5 K/uL Final  . Basophils Relative 09/13/2019 0  % Final  . Basophils Absolute 09/13/2019 0.0  0.0 - 0.1 K/uL Final  . Immature Granulocytes 09/13/2019 0  % Final  . Abs Immature Granulocytes 09/13/2019 0.01  0.00 - 0.07 K/uL Final   Performed at [facility], [address] [place], [place] [address]    Assessment:  Chris Mcdonald is a 64 y.o. male with unresectable hepatocellular carcinoma. He has a history of hepatitis C(treated with Harvoni in 04/11/2015). He has hepatitis B. Mass was discovered incidentally on low dose chest CT screenig program. AFPwas 7329. Child-Pugh ClassisA (score 5).  Low dose chest CTon 06/18/2018 revealed Lung-RADS 4A-S, suspicious. There was a new centrilobular nodularity in both lungs, more likely inflammatory. Three was a 8.5 x 5.7 cm poorly marginated geographic region of low attenuation in the superior right liver, new since 06/17/2017. Chest CTon 09/16/2018 revealed aLung-RADSscore of2, benign appearance or behavior. There was an ill-defined liver mass felt to represent a hepatoma on 07/10/2018, in the setting of cirrhosis.  Abdomen MRI on 07/10/2018 revealed an 8.8 cm heterogeneous infiltrating segment 8. Findings were consistent with a hepatoma, given the underlying cirrhotic changes involving the liver. This was also invading and obstructing the middle and right portal veins and the middle hepatic vein. There were scattered abdominal lymph nodes but no definite findings for metastatic disease.  AFPhas been followed: [id number] on 07/27/2018,6250 on 08/17/2018, 467 on 11/18/2018, 271 on 12/29/2018, 2320 on 02/08/2019, 1548 on 03/08/2019, 721 on 04/07/2019, 500 on 05/05/2019, 501.0 on 06/14/2019, 642 on 07/08/2019, 603 on 07/15/2019, and 612 on 08/12/2019.  He began lenvatinibon  08/25/2018.Hewasoff lenvatinibfrom09/09/2018 - 02/14/2019.Lenvatinib was decreased to 4 mg a day on 06/17/2019.  He discontinued lenvatinib on 08/22/2019 secondary to intolerance.  He began cabozantinib 40 mg/day on 09/06/2019.  He began vomiting on 09/11/2019.   Abdominal MRI on 08/26/2019 revealed  a branching, irregular hypoenhancing mass of the right lobe of the liver with associated atrophy of the right lobe, biliary ductal dilatation and right portal vein invasion/occlusion. The mass was not significantly changed. There was diffuse, somewhat heterogeneous arterial hyperenhancement of the right lobe of the liver, likely due to perfusion anomaly. There were multiple subcentimeter arterially enhancing foci throughout the left lobe of liver, without evidence of discrete mass, washout, or capsule.   He has a history of hypercalcemia. Calcium was 11.0 on 08/17/2018. PTH and PTH-rp were low on 08/19/2018.Calcium was 9.3 on 11/30/2018.  EKG on 06/17/2019 revealed no increased QTc.  Symptomatically, he has been unable to keep anything down since initiation of cabozantinib.  He has lost 7 pounds.  He ran out of his antiemetics.    Plan: 1.   Labs today: CBC with diff, CMP, Phos, Mg, TSH.  2.   Hepatocellular carcinoma He stopped lenvatinib on 08/22/2019 secondary to intolerance. Abdominal MRIon 08/26/2019 revealed a stable mass.  He remains Child Pugh class A (score 5).  He took cabozantinib 40 mg/day x 6 days.   He has had significant nausea vomiting and weight loss.   Last dose of cabozantinib was on 09/11/2019.  Discuss plan to continue to hold cabozantinib to back to baseline then restart at a decreased dose.  Discuss symptom management.  Refill ondansetron.   If unable to proceed with TKI, anticipate immunotherapy.   3. Weight loss  Weight loss of 7 pounds on cabozantinib.  Discuss management of nausea, fluid and caloric intake.  Continue  Boost and Ensure. 4.Nausea and dehydration IVF today 1 liter and ondansetron. Repeat orthostatics.  RTC daily this week for IVF +/- ondansetron. 5.   Patient to remain off cabozantinib. 6.   RTC on 09/16/2019 for MD assessment, labs (BMP), +/- IVF, and +/- reinitiation of cabozantinib at 20 mg/day.  I discussed the assessment and treatment plan with the patient.  The patient was provided an opportunity to ask questions and all were answered.  The patient agreed with the plan and demonstrated an understanding of the instructions.  The patient was advised to call back if the symptoms worsen or if the condition fails to improve as anticipated.   Lequita Asal, MD, PhD    09/13/2019, 11:06 AM  I, Selena Batten, am acting as a scribe for [employer]. Mike Gip, MD.   I, Nene Aranas C. Mike Gip, MD, have reviewed the above documentation for accuracy and completeness, and I agree with the above.

## 2019-09-13 ENCOUNTER — Encounter: Payer: Self-pay | Admitting: Hematology and Oncology

## 2019-09-13 ENCOUNTER — Inpatient Hospital Stay: Payer: BC Managed Care – PPO

## 2019-09-13 ENCOUNTER — Other Ambulatory Visit: Payer: Self-pay

## 2019-09-13 ENCOUNTER — Inpatient Hospital Stay (HOSPITAL_BASED_OUTPATIENT_CLINIC_OR_DEPARTMENT_OTHER): Payer: BC Managed Care – PPO | Admitting: Hematology and Oncology

## 2019-09-13 VITALS — BP 133/98 | HR 84 | Temp 95.4°F | Resp 16 | Wt 108.0 lb

## 2019-09-13 DIAGNOSIS — R112 Nausea with vomiting, unspecified: Secondary | ICD-10-CM

## 2019-09-13 DIAGNOSIS — I639 Cerebral infarction, unspecified: Secondary | ICD-10-CM

## 2019-09-13 DIAGNOSIS — R634 Abnormal weight loss: Secondary | ICD-10-CM

## 2019-09-13 DIAGNOSIS — R109 Unspecified abdominal pain: Secondary | ICD-10-CM

## 2019-09-13 DIAGNOSIS — C220 Liver cell carcinoma: Secondary | ICD-10-CM

## 2019-09-13 DIAGNOSIS — E860 Dehydration: Secondary | ICD-10-CM | POA: Diagnosis not present

## 2019-09-13 DIAGNOSIS — R1013 Epigastric pain: Secondary | ICD-10-CM

## 2019-09-13 DIAGNOSIS — R7989 Other specified abnormal findings of blood chemistry: Secondary | ICD-10-CM

## 2019-09-13 LAB — CBC WITH DIFFERENTIAL/PLATELET
Abs Immature Granulocytes: 0.01 10*3/uL (ref 0.00–0.07)
Basophils Absolute: 0 10*3/uL (ref 0.0–0.1)
Basophils Relative: 0 %
Eosinophils Absolute: 0 10*3/uL (ref 0.0–0.5)
Eosinophils Relative: 1 %
HCT: 47.9 % (ref 39.0–52.0)
Hemoglobin: 15.9 g/dL (ref 13.0–17.0)
Immature Granulocytes: 0 %
Lymphocytes Relative: 26 %
Lymphs Abs: 1.8 10*3/uL (ref 0.7–4.0)
MCH: 30.9 pg (ref 26.0–34.0)
MCHC: 33.2 g/dL (ref 30.0–36.0)
MCV: 93.2 fL (ref 80.0–100.0)
Monocytes Absolute: 0.2 10*3/uL (ref 0.1–1.0)
Monocytes Relative: 3 %
Neutro Abs: 4.9 10*3/uL (ref 1.7–7.7)
Neutrophils Relative %: 70 %
Platelets: 218 10*3/uL (ref 150–400)
RBC: 5.14 MIL/uL (ref 4.22–5.81)
RDW: 14 % (ref 11.5–15.5)
WBC: 7 10*3/uL (ref 4.0–10.5)
nRBC: 0 % (ref 0.0–0.2)

## 2019-09-13 LAB — T4, FREE: Free T4: 0.68 ng/dL (ref 0.61–1.12)

## 2019-09-13 LAB — COMPREHENSIVE METABOLIC PANEL
ALT: 143 U/L — ABNORMAL HIGH (ref 0–44)
AST: 111 U/L — ABNORMAL HIGH (ref 15–41)
Albumin: 4 g/dL (ref 3.5–5.0)
Alkaline Phosphatase: 390 U/L — ABNORMAL HIGH (ref 38–126)
Anion gap: 12 (ref 5–15)
BUN: 29 mg/dL — ABNORMAL HIGH (ref 8–23)
CO2: 24 mmol/L (ref 22–32)
Calcium: 9.2 mg/dL (ref 8.9–10.3)
Chloride: 99 mmol/L (ref 98–111)
Creatinine, Ser: 0.81 mg/dL (ref 0.61–1.24)
GFR calc Af Amer: >60 mL/min (ref >60)
GFR calc non Af Amer: >60 mL/min (ref >60)
Glucose, Bld: 90 mg/dL (ref 70–99)
Potassium: 3.9 mmol/L (ref 3.5–5.1)
Sodium: 135 mmol/L (ref 135–145)
Total Bilirubin: 0.9 mg/dL (ref 0.3–1.2)
Total Protein: 8.3 g/dL — ABNORMAL HIGH (ref 6.5–8.1)

## 2019-09-13 LAB — TSH: TSH: 10.504 u[IU]/mL — ABNORMAL HIGH (ref 0.350–4.500)

## 2019-09-13 LAB — MAGNESIUM: Magnesium: 2.1 mg/dL (ref 1.7–2.4)

## 2019-09-13 LAB — PHOSPHORUS: Phosphorus: 3.5 mg/dL (ref 2.5–4.6)

## 2019-09-13 MED ORDER — ONDANSETRON HCL 8 MG PO TABS: 8.0000 mg | ORAL_TABLET | Freq: Three times a day (TID) | ORAL | 1 refills | Status: DC | PRN

## 2019-09-13 MED ORDER — ONDANSETRON HCL 4 MG/2ML IJ SOLN
8.0000 mg | Freq: Once | INTRAMUSCULAR | Status: AC
  Administered 2019-09-13: 8 mg via INTRAVENOUS
  Filled 2019-09-13: qty 4

## 2019-09-13 MED ORDER — SODIUM CHLORIDE 0.9 % IV SOLN
Freq: Once | INTRAVENOUS | Status: AC
  Filled 2019-09-13: qty 250

## 2019-09-13 MED ORDER — SODIUM CHLORIDE 0.9 % IV SOLN: Freq: Once | INTRAVENOUS | Status: DC

## 2019-09-13 NOTE — Patient Instructions (Signed)
  Stop cabozantinib.

## 2019-09-13 NOTE — Progress Notes (Signed)
Patient here for follow up. Reports he started taking Cabozantinib on 5/17 and on 5/22 started vomiting. Reports he has no appetite and is unable to keep anything down. Patient has not taken any meds due to this.

## 2019-09-14 ENCOUNTER — Telehealth: Payer: Self-pay

## 2019-09-14 ENCOUNTER — Inpatient Hospital Stay: Payer: BC Managed Care – PPO

## 2019-09-14 DIAGNOSIS — C220 Liver cell carcinoma: Secondary | ICD-10-CM | POA: Diagnosis not present

## 2019-09-14 MED ORDER — SODIUM CHLORIDE 0.9 % IV SOLN
Freq: Once | INTRAVENOUS | Status: AC
  Filled 2019-09-14: qty 250

## 2019-09-14 MED ORDER — SODIUM CHLORIDE 0.9 % IV SOLN: Freq: Once | INTRAVENOUS | Status: DC

## 2019-09-14 MED ORDER — ONDANSETRON HCL 4 MG/2ML IJ SOLN
8.0000 mg | Freq: Once | INTRAMUSCULAR | Status: AC
  Administered 2019-09-14: 8 mg via INTRAVENOUS
  Filled 2019-09-14: qty 4

## 2019-09-14 NOTE — Telephone Encounter (Signed)
Faxed lab results to pcp

## 2019-09-14 NOTE — Progress Notes (Signed)
[facility]  [address] [place], [place] [address] Phone: [phone]  Fax: [phone]   Clinic Day:  09/16/2019  Referring physician: Donnie Coffin, MD  Chief Complaint: Chris Mcdonald is a 64 y.o. male with stage IIIBhepatocellular carcinomawho is seen for a reassessment.   HPI: The patient was last seen in the medical oncology clinic on 09/13/2019. At that time, he was not doing well.  He had been on cabozantinib since 09/06/2019.  Since initiation of treatment, he began vomiting.  He was unable to keep anything down.  He had lost 7 pounds.  BP was 133/98.  Hematocrit was 47.9, hemoglobin 15.9, platelet 218,000, WBC 7,000.  AST was 111, ALT 143, and alkaline phosphatase 390. TSH 10.504 (high) and free T4 was 0.68.   Cabozantinib was held.  He received IVF and ondansetron in clinic.  He received IVF and ondansetron on 09/14/2019 and 09/15/2019.  He reported a dramatic improvement in appetite and weight gain.  He had an initial consult with neurosurgeon Dr. Lacinda Axon on 09/14/2019. He reported having numbness in arms, hands, and left leg. He was having difficulty with his arms, legs and balance. He had significant neck pain. Dr. Lacinda Axon felt there was no need for surgery but recommended PT.   Symptomatically, he has felt better than last visit. His weight is up 3 pounds. His reports that he is eating and drinking well.  Yesterday he had a piece of chicken, mashed potatoes with gravy. He notes eating a good portion size of mashed potatoes. He drank some Gatorade. He is drinking 2 Ensure a day. He denies any nausea, vomiting or diarrhea. He had no abdominal pain. He does not feel like he needs any nausea mediation or IVF today.     Past Medical History:  Diagnosis Date  . Arthritis   . Hepatitis   . Hypertension   . Liver cancer ([place])   . Spinal stenosis   . Stroke [facility])     Past Surgical History:  Procedure Laterality Date  . COLONOSCOPY WITH  PROPOFOL N/A 02/21/2015   Procedure: COLONOSCOPY WITH PROPOFOL;  Surgeon: Lollie Sails, MD;  Location: [facility] ENDOSCOPY;  Service: Endoscopy;  Laterality: N/A;  . INGUINAL HERNIA REPAIR Bilateral   . IR RADIOLOGIST EVAL & MGMT  08/04/2018  . POSTERIOR CERVICAL VERTEBRAE EXCISION      Family History  Problem Relation Age of Onset  . Cancer Brother   . Heart disease Brother   . Cancer Mother     Social History:  reports that he has been smoking cigarettes. He has a 75.00 pack-year smoking history. He has never used smokeless tobacco. He reports that he does not drink alcohol or use drugs. Patient lives in [place] with his wife and daughter.He has a 61 year old daughter, 54 year old son, and 56 year old son. The patient is alone today.   Allergies: No Known Allergies  Current Medications: Current Outpatient Medications  Medication Sig Dispense Refill  . aspirin EC 81 MG tablet Take 81 mg by mouth daily.     Marland Kitchen atorvastatin (LIPITOR) 40 MG tablet Take 1 tablet (40 mg total) by mouth daily at 6 PM. 30 tablet 0  . cabozantinib (CABOMETYX) 20 MG tablet Take 2 tablets (40 mg total) by mouth daily. Take on an empty stomach, 1 hour before or 2 hours after meals. 60 tablet 0  . clopidogrel (PLAVIX) 75 MG tablet Take 1 tablet (75 mg total) by mouth daily.  30 tablet 0  . fluticasone (FLONASE) 50 MCG/ACT nasal spray 1 spray by Each Nare route daily.    Marland Kitchen gabapentin (NEURONTIN) 100 MG capsule Take 1 capsule (100 mg total) by mouth 2 (two) times daily. 60 capsule 0  . hydrochlorothiazide (HYDRODIURIL) 12.5 MG tablet Take 25 mg by mouth daily.     . mupirocin ointment (BACTROBAN) 2 % Apply twice a day to umbilical area 22 g 0  . omeprazole (PRILOSEC) 20 MG capsule TAKE 1 CAPSULE BY MOUTH EVERY DAY 90 capsule 1  . ondansetron (ZOFRAN) 8 MG tablet Take 1 tablet (8 mg total) by mouth every 8 (eight) hours as needed for nausea or vomiting. 20 tablet 1  . potassium chloride SA (KLOR-CON M20) 20 MEQ tablet  TAKE 1 TABLET (20 MEQ TOTAL) BY MOUTH DAILY. AS DIRECTED BY MD 40 tablet 0   No current facility-administered medications for this visit.    Review of Systems  Constitutional: Negative for chills, fever, malaise/fatigue and weight loss (up 3 lbs).       Better than last visit.  HENT: Negative.  Negative for congestion, ear pain, hearing loss, nosebleeds, sinus pain, sore throat and tinnitus.   Eyes: Negative.  Negative for blurred vision and double vision.  Respiratory: Negative.  Negative for cough, hemoptysis, sputum production and shortness of breath.   Cardiovascular: Negative.  Negative for chest pain, palpitations and leg swelling.  Gastrointestinal: Negative for abdominal pain, blood in stool, constipation, diarrhea, heartburn, melena, nausea (controlled on medication) and vomiting.       Eating well.  Drinking Gatorade.  Drinking Boost x 2 per day.  Genitourinary: Negative.  Negative for dysuria, frequency and urgency.  Musculoskeletal: Negative.  Negative for back pain, falls, myalgias and neck pain.  Skin: Negative.  Negative for rash.  Neurological: Negative.  Negative for tingling, tremors, sensory change, focal weakness, weakness and headaches.  Endo/Heme/Allergies: Negative.  Does not bruise/bleed easily.  Psychiatric/Behavioral: Negative.  Negative for depression. The patient is not nervous/anxious and does not have insomnia.   All other systems reviewed and are negative.  Performance status (ECOG):  1  Vitals Blood pressure 131/90, pulse 63, temperature (!) 94.5 F (34.7 C), temperature source Tympanic, resp. rate 16, weight 111 lb 7.1 oz (50.5 kg), SpO2 100 %.   Physical Exam  Constitutional: He is oriented to person, place, and time. He appears well-developed and well-nourished. No distress.  HENT:  Head: Normocephalic and atraumatic.  Mouth/Throat: Oropharynx is clear and moist. No oropharyngeal exudate.  Short brown hair.  Edentulous.  Temporal wasting.  Mask.    Eyes: Pupils are equal, round, and reactive to light. Conjunctivae and EOM are normal. No scleral icterus.  Blue eyes.  Neck: No JVD present.  Cardiovascular: Normal rate, regular rhythm and normal heart sounds. Exam reveals no gallop.  No murmur heard. Pulmonary/Chest: Effort normal and breath sounds normal. No respiratory distress. He has no wheezes. He has no rales.  Abdominal: Soft. Bowel sounds are normal. He exhibits no distension and no mass. There is no splenomegaly or hepatomegaly. There is no abdominal tenderness. There is no rebound and no guarding.  Musculoskeletal:        General: No tenderness or edema. Normal range of motion.     Cervical back: Normal range of motion and neck supple.  Lymphadenopathy:       Head (right side): No preauricular, no posterior auricular and no occipital adenopathy present.       Head (left side): No  preauricular, no posterior auricular and no occipital adenopathy present.    He has no cervical adenopathy.    He has no axillary adenopathy.       Right: No inguinal and no supraclavicular adenopathy present.       Left: No inguinal and no supraclavicular adenopathy present.  Neurological: He is alert and oriented to person, place, and time.  Skin: Skin is warm and dry. No rash noted. He is not diaphoretic. No erythema. No pallor.  Psychiatric: He has a normal mood and affect. His behavior is normal. Judgment and thought content normal.  Nursing note and vitals reviewed.   Imaging studies: 06/19/2019:Low dose chest CTrevealed Lung-RADS 4A-S, suspicious. There was a new centrilobular nodularity in both lungs, more likely inflammatory. Three was a 8.5 x 5.7 cm poorly marginated geographic region of low attenuation in the superior right liver, new since 06/17/2017.  09/16/2018:Chest CTrevealed aLung-RADSscore of2, benign appearance or behavior. There was an ill-defined liver mass felt to represent a hepatoma on 07/10/2018, in the setting of  cirrhosis. 07/10/2018:Abdomen MRI revealed an 8.8 cm heterogeneous infiltrating segment 8. Findings were consistent with a hepatoma, given the underlying cirrhotic changes involving the liver. This was also invading and obstructing the middle and right portal veins and the middle hepatic vein. There were scattered abdominal lymph nodes but no definite findings for metastatic disease. 11/28/2018:Abdominal MRI revealedresponse to therapy of segment 8 hepatocellular carcinoma with persistent tumor thrombus extension into the right portal vein. There was no evidence of new liver lesions or abdominal metastasis. 03/15/2019:Abdominal MRIrevealed no significant change in poorly defined mass in segment 8 of the right lobe, with persistent tumor thrombus throughout the right portal vein. There was no new liver lesions or abdominal metastatic disease. 06/15/2019:Abdominal MRIrevealedslight increase in peripheral biliary ductal dilation surrounding the infiltrative central hepatic neoplasm. Portal vein involvement may be slightly improved compared to the prior study. There was no significant change in size or new lesion.There was suggestion of subcarinal adenopathy though assessment of the chest on MRIwas limited. Consider chest CT for further evaluation. There was question of colonic thickening,whichcould herald mild colitis. Correlate clinically. 08/26/2019:  Abdominal MRI revealed a branching, irregular hypoenhancing mass of the right lobe of the liver with associated atrophy of the right lobe, biliary ductal dilatation and right portal vein invasion/occlusion. The bulk and configuration of the mass was best appreciated on coronal images, with the largest components located very centrally and in the subcapsular dome of the right lobe.  The mass was not significantly changed. There was diffuse, somewhat heterogeneous arterial hyperenhancement of the right lobe of the liver, likely due to  perfusion anomaly given occlusion of the right portal vein. There were multiple subcentimeter arterially enhancing foci throughout the left lobe of liver, without evidence of discrete mass, washout, or capsule.    Appointment on 09/16/2019  Component Date Value Ref Range Status  . Sodium 09/16/2019 136  135 - 145 mmol/L Final  . Potassium 09/16/2019 3.6  3.5 - 5.1 mmol/L Final  . Chloride 09/16/2019 102  98 - 111 mmol/L Final  . CO2 09/16/2019 27  22 - 32 mmol/L Final  . Glucose, Bld 09/16/2019 99  70 - 99 mg/dL Final   Glucose reference range applies only to samples taken after fasting for at least 8 hours.  . BUN 09/16/2019 10  8 - 23 mg/dL Final  . Creatinine, Ser 09/16/2019 0.74  0.61 - 1.24 mg/dL Final  . Calcium 09/16/2019 8.6* 8.9 - 10.3 mg/dL  Final  . Total Protein 09/16/2019 7.8  6.5 - 8.1 g/dL Final  . Albumin 09/16/2019 3.9  3.5 - 5.0 g/dL Final  . AST 09/16/2019 108* 15 - 41 U/L Final  . ALT 09/16/2019 123* 0 - 44 U/L Final  . Alkaline Phosphatase 09/16/2019 349* 38 - 126 U/L Final  . Total Bilirubin 09/16/2019 0.9  0.3 - 1.2 mg/dL Final  . GFR calc non Af Amer 09/16/2019 >60  >60 mL/min Final  . GFR calc Af Amer 09/16/2019 >60  >60 mL/min Final  . Anion gap 09/16/2019 7  5 - 15 Final   Performed at [facility], [address] [place], [place] [address]    Assessment:  Axsel Mikle is a 64 y.o. male with unresectable hepatocellular carcinoma. He has a history of hepatitis C(treated with Harvoni in 04/11/2015). He has hepatitis B. Mass was discovered incidentally on low dose chest CT screenig program. AFPwas 7329. Child-Pugh ClassisA (score 5).  Low dose chest CTon 06/18/2018 revealed Lung-RADS 4A-S, suspicious. There was a new centrilobular nodularity in both lungs, more likely inflammatory. Three was a 8.5 x 5.7 cm poorly marginated geographic region of low attenuation in the superior right liver, new since 06/17/2017. Chest CTon 09/16/2018  revealed aLung-RADSscore of2, benign appearance or behavior. There was an ill-defined liver mass felt to represent a hepatoma on 07/10/2018, in the setting of cirrhosis.  Abdomen MRI on 07/10/2018 revealed an 8.8 cm heterogeneous infiltrating segment 8. Findings were consistent with a hepatoma, given the underlying cirrhotic changes involving the liver. This was also invading and obstructing the middle and right portal veins and the middle hepatic vein. There were scattered abdominal lymph nodes but no definite findings for metastatic disease.  AFPhas been followed: [id number] on 07/27/2018,6250 on 08/17/2018, 467 on 11/18/2018, 271 on 12/29/2018, 2320 on 02/08/2019, 1548 on 03/08/2019, 721 on 04/07/2019, 500 on 05/05/2019, 501.0 on 06/14/2019, 642 on 07/08/2019, 603 on 07/15/2019, and 612 on 08/12/2019.  He began lenvatinibon 08/25/2018.Hewasoff lenvatinibfrom09/09/2018 - 02/14/2019.Lenvatinib was decreased to 4 mg a day on 06/17/2019.  He discontinued lenvatinib on 08/22/2019 secondary to intolerance.  He began cabozantinib on 40 mg/day on 09/06/2019.  He began vomiting on 09/11/2019.  Abdominal MRI on 08/26/2019 revealed a branching, irregular hypoenhancing mass of the right lobe of the liver with associated atrophy of the right lobe, biliary ductal dilatation and right portal vein invasion/occlusion. The mass was not significantly changed. There was diffuse, somewhat heterogeneous arterial hyperenhancement of the right lobe of the liver, likely due to perfusion anomaly. There were multiple subcentimeter arterially enhancing foci throughout the left lobe of liver, without evidence of discrete mass, washout, or capsule.   He has a history of hypercalcemia. Calcium was 11.0 on 08/17/2018. PTH and PTH-rp were low on 08/19/2018.Calcium was 9.3 on 11/30/2018.  EKG on 06/17/2019 revealed no increased QTc.  Symptomatically, he feels back to baseline.  He is eating and drinking well.   Nausea is controlled.  He has gained weight.  Exam is stable.  Plan: 1.   Labs today: CMP. 2.   Hepatocellular carcinoma He stopped lenvatinib on 08/22/2019 secondary to poor tolerance. Abdominal MRIon 08/26/2019 revealed a stable mass.  He remains Child Pugh class A (score 5).  He began cabozantinib on 09/06/2019.   He was unable to tolerate cabozantinib 40 mg/day after 6 days.   He stopped cabozantinib secondary to nausea and vomiting.  He is feeling back to baseline without nausea or vomiting.  Discuss consideration of starting cabozantinib  20 mg a day on 09/20/2019.  RN to call patient on 09/24/2019 to assess tolerance.   Discuss plans for immunotherapy if unable to tolerate low-dose cabozantinib or progressive disease documented.  Discuss symptom management.  He has antiemetics at home to use on a prn bases.  Interventions are adequate.    3. Abdominal discomfort Patient denies any abdominal pain.  Continue to monitor. 4. Weight loss  Weight initially down 7 pounds after initiation of cabozantinib.  Weight now up 3 pounds since discontinuation of cabozantinib.  Encourage fluids and caloric intake. 5.Dehydration  Resolved.    No IV fluids needed today. 6.   History of hypertension Continue to monitor blood pressure on cabozantinib. 7.   Elevated TSH  TSH was 10.504 (0.35-4.5) with a free T4 of 0.68 (0.61-1.12) on 09/13/2019.  Labs sent to primary care physician.  Continue to monitor. 8.   Patient to start cabozantinib 20 mg a day on 09/20/2019. 9.   RN to call patient on 09/24/2019 to assess tolerance. 10.   RTC on 09/27/2019 for MD assessment and labs (CBC with diff, CMP).  I discussed the assessment and treatment plan with the patient.  The patient was provided an opportunity to ask questions and all were answered.  The patient agreed with the plan and demonstrated an understanding of the instructions.  The  patient was advised to call back if the symptoms worsen or if the condition fails to improve as anticipated.  I provided 10 minutes of face-to-face video visit time during this this encounter and > 50% was spent counseling as documented under my assessment and plan.  I provided these services from the [facility] office.   Lequita Asal, MD, PhD    09/16/2019, 10:42 AM  I, Selena Batten, am acting as a scribe for [employer]. Mike Gip, MD.   I, Alyss Granato C. Mike Gip, MD, have reviewed the above documentation for accuracy and completeness, and I agree with the above.

## 2019-09-14 NOTE — Progress Notes (Signed)
Patient reports he ate everything in sight last night when he got home. He gained 5 lbs since yesterday according to patients scale at home. He denied any nausea today. He ate a chicken biscuit this morning. States he is going to [employer] for supper. Patient states the doctor stopped the medicine that makes him sick.

## 2019-09-15 ENCOUNTER — Inpatient Hospital Stay: Payer: BC Managed Care – PPO

## 2019-09-15 ENCOUNTER — Other Ambulatory Visit: Payer: Self-pay

## 2019-09-15 DIAGNOSIS — C220 Liver cell carcinoma: Secondary | ICD-10-CM | POA: Diagnosis not present

## 2019-09-15 MED ORDER — SODIUM CHLORIDE 0.9 % IV SOLN
Freq: Once | INTRAVENOUS | Status: AC
  Filled 2019-09-15: qty 250

## 2019-09-15 NOTE — Progress Notes (Signed)
Patient has gained 3 lbs by our scale since Monday. He denies any nausea today. He reports he is eating much better. Weakness is getting better.

## 2019-09-16 ENCOUNTER — Inpatient Hospital Stay: Payer: BC Managed Care – PPO

## 2019-09-16 ENCOUNTER — Inpatient Hospital Stay (HOSPITAL_BASED_OUTPATIENT_CLINIC_OR_DEPARTMENT_OTHER): Payer: BC Managed Care – PPO | Admitting: Hematology and Oncology

## 2019-09-16 ENCOUNTER — Encounter: Payer: Self-pay | Admitting: Hematology and Oncology

## 2019-09-16 ENCOUNTER — Ambulatory Visit: Payer: BC Managed Care – PPO | Admitting: Hematology and Oncology

## 2019-09-16 VITALS — BP 131/90 | HR 63 | Temp 94.5°F | Resp 16 | Wt 111.4 lb

## 2019-09-16 DIAGNOSIS — C220 Liver cell carcinoma: Secondary | ICD-10-CM

## 2019-09-16 DIAGNOSIS — Z7189 Other specified counseling: Secondary | ICD-10-CM | POA: Diagnosis not present

## 2019-09-16 DIAGNOSIS — R634 Abnormal weight loss: Secondary | ICD-10-CM

## 2019-09-16 DIAGNOSIS — I639 Cerebral infarction, unspecified: Secondary | ICD-10-CM

## 2019-09-16 DIAGNOSIS — R7989 Other specified abnormal findings of blood chemistry: Secondary | ICD-10-CM

## 2019-09-16 LAB — COMPREHENSIVE METABOLIC PANEL
ALT: 123 U/L — ABNORMAL HIGH (ref 0–44)
AST: 108 U/L — ABNORMAL HIGH (ref 15–41)
Albumin: 3.9 g/dL (ref 3.5–5.0)
Alkaline Phosphatase: 349 U/L — ABNORMAL HIGH (ref 38–126)
Anion gap: 7 (ref 5–15)
BUN: 10 mg/dL (ref 8–23)
CO2: 27 mmol/L (ref 22–32)
Calcium: 8.6 mg/dL — ABNORMAL LOW (ref 8.9–10.3)
Chloride: 102 mmol/L (ref 98–111)
Creatinine, Ser: 0.74 mg/dL (ref 0.61–1.24)
GFR calc Af Amer: >60 mL/min (ref >60)
GFR calc non Af Amer: >60 mL/min (ref >60)
Glucose, Bld: 99 mg/dL (ref 70–99)
Potassium: 3.6 mmol/L (ref 3.5–5.1)
Sodium: 136 mmol/L (ref 135–145)
Total Bilirubin: 0.9 mg/dL (ref 0.3–1.2)
Total Protein: 7.8 g/dL (ref 6.5–8.1)

## 2019-09-16 NOTE — Patient Instructions (Signed)
  Restart cabozantinib 1 pill a day on Monday, 09/20/2019.  Contact clinic if any problems or side effects caused by the medicine.

## 2019-09-16 NOTE — Progress Notes (Signed)
Per patient he is holding the cabometyx due to nausea/vomiting last weekend (5/22). Has not resumed back. He states that the nausea/vomiting resolved with the use of antiemetics. Reports numbness in fingertips/toes. He denies any recent falls; does not use any ambulatory devices. He saw Dr. Lacinda Axon at [facility] on 5/25. Dr. Lacinda Axon recommended PT.

## 2019-09-24 ENCOUNTER — Encounter: Payer: Self-pay | Admitting: Hematology and Oncology

## 2019-09-24 NOTE — Progress Notes (Signed)
Pt prescreened for appt. Pt reports that he started taking cabozantinib on 5/31(Monday) and it has been making him feel nauseated and lightheaded and it has been giving him stomach cramps.

## 2019-09-26 DIAGNOSIS — R1110 Vomiting, unspecified: Secondary | ICD-10-CM | POA: Insufficient documentation

## 2019-09-26 DIAGNOSIS — E860 Dehydration: Secondary | ICD-10-CM | POA: Insufficient documentation

## 2019-09-26 NOTE — Progress Notes (Signed)
[facility]  [address] [place], [place] [address] Phone: [phone]  Fax: [phone]   Clinic Day:  09/27/2019  Referring physician: Donnie Coffin, MD  Chief Complaint: Chris Mcdonald is a 64 y.o. male with stage IIIBhepatocellular carcinomawho is seen for assessment after reinitiation of cabozantinib.   HPI: The patient was last seen in the medical oncology clinic on 09/16/2019. At that time, he felt back to baseline.  He was eating and drinking well.  Nausea was controlled.  He had gained 3 pounds.  Exam was stable.  We discussed reinitiation of cabozantinib at a reduced dose of 20 mg a day on 09/20/2019.    During the interim, he hasn't felt too good. He notes that Monday he felt ok after starting cabozantinib, but Tuesday he started to have abdominal cramping. On Thursday he notes that he ate a banana and it made him throw up. He threw up again on Friday. Despite losing weight, he notes that he has been eating three meals per day and that he eats until he gets full; he is generally eating small portions however. He is taking his nausea medication as directed. He drinks 2 Boost per day.   He states that his current symptoms are much more tolerable than the symptoms of the Lenvatinib. He would like to try another week with increased symptom support before switching to IV immunotherapy.     Past Medical History:  Diagnosis Date  . Arthritis   . Hepatitis   . Hypertension   . Liver cancer ([place])   . Spinal stenosis   . Stroke [facility])     Past Surgical History:  Procedure Laterality Date  . COLONOSCOPY WITH PROPOFOL N/A 02/21/2015   Procedure: COLONOSCOPY WITH PROPOFOL;  Surgeon: Lollie Sails, MD;  Location: [facility] ENDOSCOPY;  Service: Endoscopy;  Laterality: N/A;  . INGUINAL HERNIA REPAIR Bilateral   . IR RADIOLOGIST EVAL & MGMT  08/04/2018  . POSTERIOR CERVICAL VERTEBRAE EXCISION      Family History  Problem Relation Age of Onset  .  Cancer Brother   . Heart disease Brother   . Cancer Mother     Social History:  reports that he has been smoking cigarettes. He has a 75.00 pack-year smoking history. He has never used smokeless tobacco. He reports that he does not drink alcohol or use drugs. Patient lives in [place] with his wife and daughter.He has a 41 year old daughter, 52 year old son, and 77 year old son. The patient is alone today.   Allergies: No Known Allergies  Current Medications: Current Outpatient Medications  Medication Sig Dispense Refill  . aspirin EC 81 MG tablet Take 81 mg by mouth daily.     Marland Kitchen atorvastatin (LIPITOR) 40 MG tablet Take 1 tablet (40 mg total) by mouth daily at 6 PM. 30 tablet 0  . cabozantinib (CABOMETYX) 20 MG tablet Take 2 tablets (40 mg total) by mouth daily. Take on an empty stomach, 1 hour before or 2 hours after meals. 60 tablet 0  . clopidogrel (PLAVIX) 75 MG tablet Take 1 tablet (75 mg total) by mouth daily. 30 tablet 0  . fluticasone (FLONASE) 50 MCG/ACT nasal spray 1 spray by Each Nare route daily.    Marland Kitchen gabapentin (NEURONTIN) 100 MG capsule Take 1 capsule (100 mg total) by mouth 2 (two) times daily. 60 capsule 0  . hydrochlorothiazide (HYDRODIURIL) 12.5 MG tablet Take 25 mg by mouth daily.     Marland Kitchen  mupirocin ointment (BACTROBAN) 2 % Apply twice a day to umbilical area 22 g 0  . omeprazole (PRILOSEC) 20 MG capsule TAKE 1 CAPSULE BY MOUTH EVERY DAY 90 capsule 1  . ondansetron (ZOFRAN) 8 MG tablet Take 1 tablet (8 mg total) by mouth every 8 (eight) hours as needed for nausea or vomiting. 20 tablet 1  . potassium chloride SA (KLOR-CON M20) 20 MEQ tablet TAKE 1 TABLET (20 MEQ TOTAL) BY MOUTH DAILY. AS DIRECTED BY MD 40 tablet 0   No current facility-administered medications for this visit.    Review of Systems  Constitutional: Negative for chills, fever, malaise/fatigue and weight loss (up 3 lbs).       Better than last visit.  HENT: Negative.  Negative for congestion, ear pain,  hearing loss, nosebleeds, sinus pain, sore throat and tinnitus.   Eyes: Negative.  Negative for blurred vision and double vision.  Respiratory: Negative.  Negative for cough, hemoptysis, sputum production and shortness of breath.   Cardiovascular: Negative.  Negative for chest pain, palpitations and leg swelling.  Gastrointestinal: Positive for abdominal pain, nausea (controlled on medication) and vomiting. Negative for blood in stool, constipation, diarrhea, heartburn and melena.       Eating well.  Drinking Gatorade.  Drinking Boost x 2 per day.  Genitourinary: Negative.  Negative for dysuria, frequency and urgency.  Musculoskeletal: Negative.  Negative for back pain, falls, myalgias and neck pain.  Skin: Negative.  Negative for rash.  Neurological: Negative.  Negative for tingling, tremors, sensory change, focal weakness, weakness and headaches.  Endo/Heme/Allergies: Negative.  Does not bruise/bleed easily.  Psychiatric/Behavioral: Negative.  Negative for depression. The patient is not nervous/anxious and does not have insomnia.   All other systems reviewed and are negative.  Performance status (ECOG): 1 - Symptomatic but completely ambulatory  Vitals Blood pressure (!) 130/94, pulse 72, temperature (!) 94 F (34.4 C), temperature source Tympanic, resp. rate 16, weight 109 lb 10.9 oz (49.7 kg), SpO2 100 %.   Physical Exam  Constitutional: He is oriented to person, place, and time. He appears well-developed. No distress.  HENT:  Head: Normocephalic and atraumatic.  Mouth/Throat: Oropharynx is clear and moist. No oropharyngeal exudate.  Short brown hair.  Edentulous.  Temporal wasting.  Mask.  Eyes: Pupils are equal, round, and reactive to light. Conjunctivae and EOM are normal. No scleral icterus.  Blue eyes.  Neck: No JVD present.  Cardiovascular: Normal rate, regular rhythm and normal heart sounds. Exam reveals no gallop.  No murmur heard. Pulmonary/Chest: Effort normal and breath  sounds normal. No respiratory distress. He has no wheezes. He has no rales.  Abdominal: Soft. Bowel sounds are normal. He exhibits no distension and no mass. There is no splenomegaly or hepatomegaly. There is no abdominal tenderness. There is no rebound and no guarding.  Musculoskeletal:        General: No tenderness or edema. Normal range of motion.     Cervical back: Normal range of motion and neck supple.  Lymphadenopathy:       Head (right side): No preauricular, no posterior auricular and no occipital adenopathy present.       Head (left side): No preauricular, no posterior auricular and no occipital adenopathy present.    He has no cervical adenopathy.    He has no axillary adenopathy.       Right: No supraclavicular adenopathy present.       Left: No supraclavicular adenopathy present.  Neurological: He is alert and oriented to  person, place, and time.  Skin: Skin is warm and dry. No rash noted. He is not diaphoretic. No erythema. No pallor.  Psychiatric: He has a normal mood and affect. His behavior is normal. Judgment and thought content normal.  Nursing note and vitals reviewed.   Imaging studies: 06/19/2019:Low dose chest CTrevealed Lung-RADS 4A-S, suspicious. There was a new centrilobular nodularity in both lungs, more likely inflammatory. Three was a 8.5 x 5.7 cm poorly marginated geographic region of low attenuation in the superior right liver, new since 06/17/2017.  09/16/2018:Chest CTrevealed aLung-RADSscore of2, benign appearance or behavior. There was an ill-defined liver mass felt to represent a hepatoma on 07/10/2018, in the setting of cirrhosis. 07/10/2018:Abdomen MRI revealed an 8.8 cm heterogeneous infiltrating segment 8. Findings were consistent with a hepatoma, given the underlying cirrhotic changes involving the liver. This was also invading and obstructing the middle and right portal veins and the middle hepatic vein. There were scattered abdominal  lymph nodes but no definite findings for metastatic disease. 11/28/2018:Abdominal MRI revealedresponse to therapy of segment 8 hepatocellular carcinoma with persistent tumor thrombus extension into the right portal vein. There was no evidence of new liver lesions or abdominal metastasis. 03/15/2019:Abdominal MRIrevealed no significant change in poorly defined mass in segment 8 of the right lobe, with persistent tumor thrombus throughout the right portal vein. There was no new liver lesions or abdominal metastatic disease. 06/15/2019:Abdominal MRIrevealedslight increase in peripheral biliary ductal dilation surrounding the infiltrative central hepatic neoplasm. Portal vein involvement may be slightly improved compared to the prior study. There was no significant change in size or new lesion.There was suggestion of subcarinal adenopathy though assessment of the chest on MRIwas limited. Consider chest CT for further evaluation. There was question of colonic thickening,whichcould herald mild colitis. Correlate clinically. 08/26/2019:  Abdominal MRI revealed a branching, irregular hypoenhancing mass of the right lobe of the liver with associated atrophy of the right lobe, biliary ductal dilatation and right portal vein invasion/occlusion. The bulk and configuration of the mass was best appreciated on coronal images, with the largest components located very centrally and in the subcapsular dome of the right lobe.  The mass was not significantly changed. There was diffuse, somewhat heterogeneous arterial hyperenhancement of the right lobe of the liver, likely due to perfusion anomaly given occlusion of the right portal vein. There were multiple subcentimeter arterially enhancing foci throughout the left lobe of liver, without evidence of discrete mass, washout, or capsule.    No visits with results within 3 Day(s) from this visit.  Latest known visit with results is:  Appointment on 09/16/2019    Component Date Value Ref Range Status  . Sodium 09/16/2019 136  135 - 145 mmol/L Final  . Potassium 09/16/2019 3.6  3.5 - 5.1 mmol/L Final  . Chloride 09/16/2019 102  98 - 111 mmol/L Final  . CO2 09/16/2019 27  22 - 32 mmol/L Final  . Glucose, Bld 09/16/2019 99  70 - 99 mg/dL Final   Glucose reference range applies only to samples taken after fasting for at least 8 hours.  . BUN 09/16/2019 10  8 - 23 mg/dL Final  . Creatinine, Ser 09/16/2019 0.74  0.61 - 1.24 mg/dL Final  . Calcium 09/16/2019 8.6* 8.9 - 10.3 mg/dL Final  . Total Protein 09/16/2019 7.8  6.5 - 8.1 g/dL Final  . Albumin 09/16/2019 3.9  3.5 - 5.0 g/dL Final  . AST 09/16/2019 108* 15 - 41 U/L Final  . ALT 09/16/2019 123* 0 - 44  U/L Final  . Alkaline Phosphatase 09/16/2019 349* 38 - 126 U/L Final  . Total Bilirubin 09/16/2019 0.9  0.3 - 1.2 mg/dL Final  . GFR calc non Af Amer 09/16/2019 >60  >60 mL/min Final  . GFR calc Af Amer 09/16/2019 >60  >60 mL/min Final  . Anion gap 09/16/2019 7  5 - 15 Final   Performed at [facility], [address] [place], [place] [address]    Assessment:  Chris Mcdonald is a 64 y.o. male with unresectable hepatocellular carcinoma. He has a history of hepatitis C(treated with Harvoni in 04/11/2015). He has hepatitis B. Mass was discovered incidentally on low dose chest CT screenig program. AFPwas 7329. Child-Pugh ClassisA (score 5).  Low dose chest CTon 06/18/2018 revealed Lung-RADS 4A-S, suspicious. There was a new centrilobular nodularity in both lungs, more likely inflammatory. Three was a 8.5 x 5.7 cm poorly marginated geographic region of low attenuation in the superior right liver, new since 06/17/2017. Chest CTon 09/16/2018 revealed aLung-RADSscore of2, benign appearance or behavior. There was an ill-defined liver mass felt to represent a hepatoma on 07/10/2018, in the setting of cirrhosis.  Abdomen MRI on 07/10/2018 revealed an 8.8 cm heterogeneous  infiltrating segment 8. Findings were consistent with a hepatoma, given the underlying cirrhotic changes involving the liver. This was also invading and obstructing the middle and right portal veins and the middle hepatic vein. There were scattered abdominal lymph nodes but no definite findings for metastatic disease.  AFPhas been followed: 7253 on 07/27/2018,6250 on 08/17/2018, 467 on 11/18/2018, 271 on 12/29/2018, 2320 on 02/08/2019, 1548 on 03/08/2019, 721 on 04/07/2019, 500 on 05/05/2019, 501.0 on 06/14/2019, 642 on 07/08/2019, 603 on 07/15/2019, and 612 on 08/12/2019.  He began lenvatinibon 08/25/2018.Hewasoff lenvatinibfrom09/09/2018 - 02/14/2019.Lenvatinib was decreased to 4 mg a day on 06/17/2019.  He discontinued lenvatinib on 08/22/2019 secondary to intolerance.  He began cabozantinib on 40 mg/day on 09/06/2019.  He began vomiting on 09/11/2019.  Abdominal MRI on 08/26/2019 revealed a branching, irregular hypoenhancing mass of the right lobe of the liver with associated atrophy of the right lobe, biliary ductal dilatation and right portal vein invasion/occlusion. The mass was not significantly changed. There was diffuse, somewhat heterogeneous arterial hyperenhancement of the right lobe of the liver, likely due to perfusion anomaly. There were multiple subcentimeter arterially enhancing foci throughout the left lobe of liver, without evidence of discrete mass, washout, or capsule.   He has a history of hypercalcemia. Calcium was 11.0 on 08/17/2018. PTH and PTH-rp were low on 08/19/2018.Calcium was 9.3 on 11/30/2018.  EKG on 06/17/2019 revealed no increased QTc.  Symptomatically, he has not felt good.  He notes abdominal cramping and nausea/vomiting with reinitiation of cabozantinib.  He wishes to continue another week of treatment.  Plan: 1.   Labs today: CMP 2.   Hepatocellular carcinoma He stopped lenvatinib on 08/22/2019 secondary to poor  tolerance. Abdominal MRIon 08/26/2019 revealed a stable mass.  He remains Child Pugh class A (score 5).  He began cabozantinib on 09/06/2019.   He was unable to tolerate cabozantinib 40 mg/day after 6 days.   He stopped cabozantinib secondary to nausea and vomiting.  He restarted cabozantinib at 20 mg a day on 09/20/2019.  Symptomatically, he developed recurrent abdominal discomfort, nausea and vomiting.  Review plans to initiate immunotherapy if he is unable to tolerate reduced dose cabozantinib.   Several questions were asked and answered.  Discuss symptom management.  He has antiemetics at home to use on a prn  bases.  Interventions are adequate.    3. Abdominal discomfort Abdominal discomforts are restarted with initiation of lower dose cabozantinib.  Discuss symptom management.  Patient may not be able to tolerate cabozantinib at any dose.  Patient to contact clinic between appointments if he is not tolerating treatment. 4. Weight loss  Weight down 2 pounds with reinitiation of cabozantinib.  Discuss small frequent meals 5.   RN to call patient at end of week to assess tolerance of cabozantinib. 6.   RTC in 1 week for MD assess, labs (CMP), and +/- IVF.  I discussed the assessment and treatment plan with the patient.  The patient was provided an opportunity to ask questions and all were answered.  The patient agreed with the plan and demonstrated an understanding of the instructions.  The patient was advised to call back if the symptoms worsen or if the condition fails to improve as anticipated.    Lequita Asal, MD, PhD    09/27/2019, 10:23 AM  I, Jacqualyn Posey, am acting as a Education administrator for [employer]. Mike Gip, MD.   I, Liddy Deam C. Mike Gip, MD, have reviewed the above documentation for accuracy and completeness, and I agree with the above.

## 2019-09-27 ENCOUNTER — Inpatient Hospital Stay: Payer: BC Managed Care – PPO | Attending: Hematology and Oncology | Admitting: Hematology and Oncology

## 2019-09-27 ENCOUNTER — Other Ambulatory Visit: Payer: Self-pay

## 2019-09-27 ENCOUNTER — Inpatient Hospital Stay: Payer: BC Managed Care – PPO

## 2019-09-27 ENCOUNTER — Encounter: Payer: Self-pay | Admitting: Hematology and Oncology

## 2019-09-27 VITALS — BP 130/94 | HR 72 | Temp 94.0°F | Resp 16 | Wt 109.7 lb

## 2019-09-27 DIAGNOSIS — C220 Liver cell carcinoma: Secondary | ICD-10-CM

## 2019-09-27 DIAGNOSIS — R945 Abnormal results of liver function studies: Secondary | ICD-10-CM | POA: Insufficient documentation

## 2019-09-27 DIAGNOSIS — Z8249 Family history of ischemic heart disease and other diseases of the circulatory system: Secondary | ICD-10-CM | POA: Insufficient documentation

## 2019-09-27 DIAGNOSIS — Z8673 Personal history of transient ischemic attack (TIA), and cerebral infarction without residual deficits: Secondary | ICD-10-CM | POA: Insufficient documentation

## 2019-09-27 DIAGNOSIS — I81 Portal vein thrombosis: Secondary | ICD-10-CM | POA: Insufficient documentation

## 2019-09-27 DIAGNOSIS — R918 Other nonspecific abnormal finding of lung field: Secondary | ICD-10-CM | POA: Diagnosis not present

## 2019-09-27 DIAGNOSIS — F17210 Nicotine dependence, cigarettes, uncomplicated: Secondary | ICD-10-CM | POA: Insufficient documentation

## 2019-09-27 DIAGNOSIS — R7989 Other specified abnormal findings of blood chemistry: Secondary | ICD-10-CM | POA: Diagnosis not present

## 2019-09-27 DIAGNOSIS — E876 Hypokalemia: Secondary | ICD-10-CM | POA: Insufficient documentation

## 2019-09-27 DIAGNOSIS — Z5112 Encounter for antineoplastic immunotherapy: Secondary | ICD-10-CM | POA: Insufficient documentation

## 2019-09-27 DIAGNOSIS — I10 Essential (primary) hypertension: Secondary | ICD-10-CM | POA: Insufficient documentation

## 2019-09-27 DIAGNOSIS — R634 Abnormal weight loss: Secondary | ICD-10-CM | POA: Diagnosis not present

## 2019-09-27 DIAGNOSIS — Z7982 Long term (current) use of aspirin: Secondary | ICD-10-CM | POA: Insufficient documentation

## 2019-09-27 DIAGNOSIS — I639 Cerebral infarction, unspecified: Secondary | ICD-10-CM

## 2019-09-27 DIAGNOSIS — Z79899 Other long term (current) drug therapy: Secondary | ICD-10-CM | POA: Diagnosis not present

## 2019-09-27 DIAGNOSIS — Z809 Family history of malignant neoplasm, unspecified: Secondary | ICD-10-CM | POA: Diagnosis not present

## 2019-09-27 LAB — COMPREHENSIVE METABOLIC PANEL
ALT: 224 U/L — ABNORMAL HIGH (ref 0–44)
AST: 148 U/L — ABNORMAL HIGH (ref 15–41)
Albumin: 3.9 g/dL (ref 3.5–5.0)
Alkaline Phosphatase: 539 U/L — ABNORMAL HIGH (ref 38–126)
Anion gap: 9 (ref 5–15)
BUN: 14 mg/dL (ref 8–23)
CO2: 26 mmol/L (ref 22–32)
Calcium: 8.7 mg/dL — ABNORMAL LOW (ref 8.9–10.3)
Chloride: 101 mmol/L (ref 98–111)
Creatinine, Ser: 0.88 mg/dL (ref 0.61–1.24)
GFR calc Af Amer: >60 mL/min (ref >60)
GFR calc non Af Amer: >60 mL/min (ref >60)
Glucose, Bld: 94 mg/dL (ref 70–99)
Potassium: 3.1 mmol/L — ABNORMAL LOW (ref 3.5–5.1)
Sodium: 136 mmol/L (ref 135–145)
Total Bilirubin: 1 mg/dL (ref 0.3–1.2)
Total Protein: 7.9 g/dL (ref 6.5–8.1)

## 2019-09-27 LAB — CBC WITH DIFFERENTIAL/PLATELET
Abs Immature Granulocytes: 0.01 10*3/uL (ref 0.00–0.07)
Basophils Absolute: 0 10*3/uL (ref 0.0–0.1)
Basophils Relative: 1 %
Eosinophils Absolute: 0.1 10*3/uL (ref 0.0–0.5)
Eosinophils Relative: 3 %
HCT: 46.6 % (ref 39.0–52.0)
Hemoglobin: 15.3 g/dL (ref 13.0–17.0)
Immature Granulocytes: 0 %
Lymphocytes Relative: 39 %
Lymphs Abs: 1.4 10*3/uL (ref 0.7–4.0)
MCH: 30.2 pg (ref 26.0–34.0)
MCHC: 32.8 g/dL (ref 30.0–36.0)
MCV: 92.1 fL (ref 80.0–100.0)
Monocytes Absolute: 0.2 10*3/uL (ref 0.1–1.0)
Monocytes Relative: 6 %
Neutro Abs: 1.8 10*3/uL (ref 1.7–7.7)
Neutrophils Relative %: 51 %
Platelets: 121 10*3/uL — ABNORMAL LOW (ref 150–400)
RBC: 5.06 MIL/uL (ref 4.22–5.81)
RDW: 14.3 % (ref 11.5–15.5)
WBC: 3.6 10*3/uL — ABNORMAL LOW (ref 4.0–10.5)
nRBC: 0 % (ref 0.0–0.2)

## 2019-09-28 ENCOUNTER — Telehealth: Payer: Self-pay

## 2019-09-28 ENCOUNTER — Other Ambulatory Visit: Payer: Self-pay

## 2019-09-28 DIAGNOSIS — R112 Nausea with vomiting, unspecified: Secondary | ICD-10-CM

## 2019-09-28 MED ORDER — ONDANSETRON HCL 8 MG PO TABS: 8.0000 mg | ORAL_TABLET | Freq: Three times a day (TID) | ORAL | 1 refills | Status: DC | PRN

## 2019-09-28 NOTE — Telephone Encounter (Signed)
Called patient regarding low potassium per Dr Mike Gip secure chat "Confirm the dose of potassium he has and confirm that he has taken it daily. I think he takes 20 meq a day. If he has taken it once a day, increase to 1 pill twice a day for 2 days than back to 1 pill a day." and "Please also let him know that his LFTs have increased and likely due to his [place]. If he cannot tolerate the cabozantinib, please let us know before next week,"  Patient aware and understands

## 2019-09-30 ENCOUNTER — Other Ambulatory Visit: Payer: Self-pay | Admitting: Hematology and Oncology

## 2019-09-30 ENCOUNTER — Other Ambulatory Visit: Payer: Self-pay | Admitting: Emergency Medicine

## 2019-09-30 ENCOUNTER — Telehealth: Payer: Self-pay | Admitting: *Deleted

## 2019-09-30 ENCOUNTER — Telehealth: Payer: Self-pay | Admitting: Emergency Medicine

## 2019-09-30 DIAGNOSIS — C220 Liver cell carcinoma: Secondary | ICD-10-CM

## 2019-09-30 NOTE — Progress Notes (Signed)
DISCONTINUE OFF PATHWAY REGIMEN - Other Dx   [id number]:Lenvatinib:   Once daily:     Lenvatinib   **Always confirm dose/schedule in your pharmacy ordering system**  REASON: Toxicities / Adverse Event PRIOR TREATMENT: Lenvatinib TREATMENT RESPONSE: Unable to Evaluate  START OFF PATHWAY REGIMEN - Other   [id number]:Nivolumab 240 mg IV D1 q14 Days:   A cycle is every 14 days:     Nivolumab   **Always confirm dose/schedule in your pharmacy ordering system**  Patient Characteristics: Intent of Therapy: Non-Curative / Palliative Intent, Discussed with Patient

## 2019-09-30 NOTE — Telephone Encounter (Signed)
Called patient to see how he's doing with oral chemo. Pt reports that he's been lightheaded. Was having vomiting last week when he first started the medication, but no vomiting this week. Has been having stomach cramps/pain constantly, denies any diarrhea, LBM today which was normal. Has been having okay fluid intake, but difficulty eating due to the pain and nausea with eating. States that he would like to go ahead and switch to the IV immunotherapy as discussed.

## 2019-09-30 NOTE — Telephone Encounter (Signed)
Patient called and wanted Dr Mike Gip to know that he has decided to take the IV medicine as discussed at his last visit.

## 2019-09-30 NOTE — Telephone Encounter (Signed)
  Please call patient.  He should stop his cabozantinib (pill).  I will work on a preauth for him.  Would he like some IVF and possible anti-emetics today.  M

## 2019-09-30 NOTE — Telephone Encounter (Signed)
Patient declines IVF and meds today, states that he feels ok to stay home and have oral fluids and use oral antiemetics. Will call triage line tomorrow if he needs to be seen in [facility] with [facility] for IVF.   Verbalized understanding to stop cabozantinib.   Chris Mcdonald

## 2019-10-04 ENCOUNTER — Inpatient Hospital Stay: Payer: BC Managed Care – PPO | Attending: Hematology and Oncology

## 2019-10-04 NOTE — Progress Notes (Signed)
Nutrition  Unable to reach patient by phone for nutrition follow-up.  No option to leave voicemail.  Iseah Plouff B. Zenia Resides, [place], [place] Registered Dietitian [phone] (pager)

## 2019-10-05 ENCOUNTER — Ambulatory Visit: Payer: BC Managed Care – PPO | Admitting: Hematology and Oncology

## 2019-10-05 ENCOUNTER — Other Ambulatory Visit: Payer: BC Managed Care – PPO

## 2019-10-06 ENCOUNTER — Other Ambulatory Visit: Payer: BC Managed Care – PPO

## 2019-10-06 ENCOUNTER — Ambulatory Visit: Payer: BC Managed Care – PPO | Admitting: Hematology and Oncology

## 2019-10-06 ENCOUNTER — Encounter: Payer: Self-pay | Admitting: Hematology and Oncology

## 2019-10-06 ENCOUNTER — Other Ambulatory Visit: Payer: Self-pay | Admitting: *Deleted

## 2019-10-06 ENCOUNTER — Ambulatory Visit: Payer: BC Managed Care – PPO

## 2019-10-06 NOTE — Progress Notes (Signed)
Patient called for pre screening. Denies concerns and pain at this time.

## 2019-10-07 ENCOUNTER — Inpatient Hospital Stay: Payer: BC Managed Care – PPO

## 2019-10-07 ENCOUNTER — Ambulatory Visit
Admission: RE | Admit: 2019-10-07 | Discharge: 2019-10-07 | Disposition: A | Discharge status: Home / Self Care | Payer: BC Managed Care – PPO | Source: Ambulatory Visit | Attending: Hematology and Oncology | Admitting: Hematology and Oncology

## 2019-10-07 ENCOUNTER — Inpatient Hospital Stay (HOSPITAL_BASED_OUTPATIENT_CLINIC_OR_DEPARTMENT_OTHER): Payer: BC Managed Care – PPO | Admitting: Hematology and Oncology

## 2019-10-07 ENCOUNTER — Encounter: Payer: Self-pay | Admitting: Hematology and Oncology

## 2019-10-07 ENCOUNTER — Other Ambulatory Visit: Payer: Self-pay

## 2019-10-07 VITALS — BP 124/87 | HR 60 | Temp 97.0°F | Wt 110.9 lb

## 2019-10-07 DIAGNOSIS — Z7189 Other specified counseling: Secondary | ICD-10-CM

## 2019-10-07 DIAGNOSIS — C220 Liver cell carcinoma: Secondary | ICD-10-CM

## 2019-10-07 DIAGNOSIS — R7989 Other specified abnormal findings of blood chemistry: Secondary | ICD-10-CM

## 2019-10-07 DIAGNOSIS — R1013 Epigastric pain: Secondary | ICD-10-CM | POA: Diagnosis not present

## 2019-10-07 DIAGNOSIS — R634 Abnormal weight loss: Secondary | ICD-10-CM

## 2019-10-07 DIAGNOSIS — R109 Unspecified abdominal pain: Secondary | ICD-10-CM | POA: Diagnosis not present

## 2019-10-07 DIAGNOSIS — I639 Cerebral infarction, unspecified: Secondary | ICD-10-CM

## 2019-10-07 LAB — CBC WITH DIFFERENTIAL/PLATELET
Abs Immature Granulocytes: 0.01 10*3/uL (ref 0.00–0.07)
Basophils Absolute: 0 10*3/uL (ref 0.0–0.1)
Basophils Relative: 1 %
Eosinophils Absolute: 0.1 10*3/uL (ref 0.0–0.5)
Eosinophils Relative: 2 %
HCT: 41.8 % (ref 39.0–52.0)
Hemoglobin: 13.9 g/dL (ref 13.0–17.0)
Immature Granulocytes: 0 %
Lymphocytes Relative: 29 %
Lymphs Abs: 1 10*3/uL (ref 0.7–4.0)
MCH: 31.1 pg (ref 26.0–34.0)
MCHC: 33.3 g/dL (ref 30.0–36.0)
MCV: 93.5 fL (ref 80.0–100.0)
Monocytes Absolute: 0.2 10*3/uL (ref 0.1–1.0)
Monocytes Relative: 6 %
Neutro Abs: 2.1 10*3/uL (ref 1.7–7.7)
Neutrophils Relative %: 62 %
Platelets: 118 10*3/uL — ABNORMAL LOW (ref 150–400)
RBC: 4.47 MIL/uL (ref 4.22–5.81)
RDW: 14.8 % (ref 11.5–15.5)
WBC: 3.5 10*3/uL — ABNORMAL LOW (ref 4.0–10.5)
nRBC: 0 % (ref 0.0–0.2)

## 2019-10-07 LAB — COMPREHENSIVE METABOLIC PANEL
ALT: 206 U/L — ABNORMAL HIGH (ref 0–44)
AST: 150 U/L — ABNORMAL HIGH (ref 15–41)
Albumin: 3.8 g/dL (ref 3.5–5.0)
Alkaline Phosphatase: 485 U/L — ABNORMAL HIGH (ref 38–126)
Anion gap: 8 (ref 5–15)
BUN: 10 mg/dL (ref 8–23)
CO2: 27 mmol/L (ref 22–32)
Calcium: 8.9 mg/dL (ref 8.9–10.3)
Chloride: 99 mmol/L (ref 98–111)
Creatinine, Ser: 0.81 mg/dL (ref 0.61–1.24)
GFR calc Af Amer: >60 mL/min (ref >60)
GFR calc non Af Amer: >60 mL/min (ref >60)
Glucose, Bld: 80 mg/dL (ref 70–99)
Potassium: 3.2 mmol/L — ABNORMAL LOW (ref 3.5–5.1)
Sodium: 134 mmol/L — ABNORMAL LOW (ref 135–145)
Total Bilirubin: 1.2 mg/dL (ref 0.3–1.2)
Total Protein: 7.5 g/dL (ref 6.5–8.1)

## 2019-10-07 MED ORDER — GADOBUTROL 1 MMOL/ML IV SOLN
5.0000 mL | Freq: Once | INTRAVENOUS | Status: AC | PRN
  Administered 2019-10-07: 5 mL via INTRAVENOUS

## 2019-10-07 NOTE — Progress Notes (Signed)
[facility]  [address] [place], [place] [address] Phone: [phone]  Fax: [phone]   Clinic Day:  10/08/2019  Referring physician: Donnie Coffin, MD  Chief Complaint: Chris Mcdonald is a 64 y.o. male with stage IIIBhepatocellular carcinoma who is seen for review of a liver MRI and consideration of initiation of cycle #1 nivolumab.  HPI:   The patient was last seen in the medical oncology clinic on 10/07/2019. At that time, he was feeling better after discontinuation of cabozantinib.  Abdominal pain had improved.  He was eating better.  He was drinking 2 Ensure a day.  He was taking oral potassium daily. Hematocrit 41.8, hemoglobin 13.9, platelets 118,000, WBC 3,500. Potassium was 3.2.   LFTs included an AST of 150, ALT 206, bilirubin 1.2, and alkaline phosphatase 485.  The etiology of his LFTs was felt secondary to his underlying liver disease.  Liver MRI was ordered.  His potassium was increased.   Abdominal MRI on 10/07/2019 revealed question minimal increase in distension of LEFT intrahepatic and LEFT hepatic biliary ducts (minimal).  There was overall very little interval change in the appearance of the mass, portal vein invasion and RIGHT intrahepatic biliary ductal dilation.  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  There was no abdominal adenopathy.  During the interim, the he has felt "good".  His nausea has improved. He describes chronic back pain. He took potassium pills 3 times yesterday. He took a potassium once today. Potassium is 3.3 today.    Past Medical History:  Diagnosis Date  . Arthritis   . Hepatitis   . Hypertension   . Liver cancer ([place])   . Spinal stenosis   . Stroke [facility])     Past Surgical History:  Procedure Laterality Date  . COLONOSCOPY WITH  PROPOFOL N/A 02/21/2015   Procedure: COLONOSCOPY WITH PROPOFOL;  Surgeon: Lollie Sails, MD;  Location: [facility] ENDOSCOPY;  Service: Endoscopy;  Laterality: N/A;  . INGUINAL HERNIA REPAIR Bilateral   . IR RADIOLOGIST EVAL & MGMT  08/04/2018  . POSTERIOR CERVICAL VERTEBRAE EXCISION      Family History  Problem Relation Age of Onset  . Cancer Brother   . Heart disease Brother   . Cancer Mother     Social History:  reports that he has been smoking cigarettes. He has a 75.00 pack-year smoking history. He has never used smokeless tobacco. He reports that he does not drink alcohol and does not use drugs.  [name] and daughter.He has a 23 year old daughter, 28 year old son, and 43 year old son. The patient is alone today.  Allergies: No Known Allergies  Current Medications: Current Outpatient Medications  Medication Sig Dispense Refill  . aspirin EC 81 MG tablet Take 81 mg by mouth daily.     Marland Kitchen atorvastatin (LIPITOR) 40 MG tablet Take 1 tablet (40 mg total) by mouth daily at 6 PM. 30 tablet 0  . cabozantinib (CABOMETYX) 20 MG tablet Take 2 tablets (40 mg total) by mouth daily. Take on an empty stomach, 1 hour before or 2 hours after meals. 60 tablet 0  . clopidogrel (PLAVIX) 75 MG tablet Take 1 tablet (75 mg total) by mouth daily. 30 tablet 0  . fluticasone (FLONASE) 50 MCG/ACT nasal spray 1 spray by Each  Nare route daily.    Marland Kitchen gabapentin (NEURONTIN) 100 MG capsule Take 1 capsule (100 mg total) by mouth 2 (two) times daily. 60 capsule 0  . hydrochlorothiazide (HYDRODIURIL) 12.5 MG tablet Take 25 mg by mouth daily.     . mupirocin ointment (BACTROBAN) 2 % Apply twice a day to umbilical area 22 g 0  . ondansetron (ZOFRAN) 8 MG tablet Take 1 tablet (8 mg total) by mouth every 8 (eight) hours as needed for nausea or vomiting. 20 tablet 1  . potassium chloride SA (KLOR-CON M20) 20 MEQ tablet TAKE 1 TABLET (20 MEQ TOTAL) BY MOUTH DAILY. AS DIRECTED BY MD 40 tablet 0  .  omeprazole (PRILOSEC) 20 MG capsule TAKE 1 CAPSULE BY MOUTH EVERY DAY (Patient not taking: Reported on 10/07/2019) 90 capsule 1   No current facility-administered medications for this visit.    Review of Systems  Constitutional: Negative.  Negative for chills, diaphoresis, fever, malaise/fatigue and weight loss (up 1 lb).       Feels "good".  HENT: Negative.  Negative for congestion, ear discharge, ear pain, hearing loss, nosebleeds, sinus pain, sore throat and tinnitus.   Eyes: Negative.  Negative for blurred vision.  Respiratory: Negative.  Negative for cough, hemoptysis, sputum production and shortness of breath.   Cardiovascular: Negative.  Negative for chest pain, palpitations and leg swelling.  Gastrointestinal: Negative.  Negative for abdominal pain, blood in stool, constipation, diarrhea, heartburn, melena, nausea and vomiting.       Eating "ok." Drinking Boost x 2 per day.   Genitourinary: Negative.  Negative for dysuria, frequency, hematuria and urgency.  Musculoskeletal: Positive for back pain (chronic). Negative for joint pain, myalgias and neck pain.  Skin: Negative.  Negative for itching and rash.  Neurological: Negative.  Negative for dizziness, tingling, sensory change, weakness and headaches.  Endo/Heme/Allergies: Negative.  Does not bruise/bleed easily.  Psychiatric/Behavioral: Negative.  Negative for depression and memory loss. The patient is not nervous/anxious and does not have insomnia.   All other systems reviewed and are negative.  Performance status (ECOG):  1  Vitals Blood pressure 113/72, pulse 65, temperature 98.2 F (36.8 C), temperature source Oral, weight 111 lb 10.6 oz (50.7 kg), SpO2 100 %.   Physical Exam Vitals and nursing note reviewed.  Constitutional:      General: He is not in acute distress.    Appearance: Normal appearance.     Interventions: Face mask in place.  HENT:     Head: Normocephalic and atraumatic.     Comments: Short brown hair.  Gray beard. Edentulous. Temporal wasting.    Mouth/Throat:     Mouth: Mucous membranes are moist.     Pharynx: Oropharynx is clear.  Eyes:     General: No scleral icterus.    Extraocular Movements: Extraocular movements intact.     Conjunctiva/sclera: Conjunctivae normal.     Pupils: Pupils are equal, round, and reactive to light.     Comments: Blue eyes.  Cardiovascular:     Rate and Rhythm: Normal rate and regular rhythm.     Pulses: Normal pulses.     Heart sounds: Normal heart sounds. No murmur heard.   Pulmonary:     Effort: Pulmonary effort is normal. No respiratory distress.     Breath sounds: Normal breath sounds. No wheezing or rales.  Chest:     Chest wall: No tenderness.  Abdominal:     General: Bowel sounds are normal. There is no distension.  Palpations: Abdomen is soft. There is no mass.     Tenderness: There is no abdominal tenderness. There is no guarding.  Musculoskeletal:        General: No swelling or tenderness. Normal range of motion.     Cervical back: Normal range of motion and neck supple.  Lymphadenopathy:     Head:     Right side of head: No preauricular, posterior auricular or occipital adenopathy.     Left side of head: No preauricular, posterior auricular or occipital adenopathy.     Cervical: No cervical adenopathy.     Upper Body:     Right upper body: No supraclavicular adenopathy.     Left upper body: No supraclavicular adenopathy.     Lower Body: No right inguinal adenopathy. No left inguinal adenopathy.  Skin:    General: Skin is warm and dry.  Neurological:     Mental Status: He is alert and oriented to person, place, and time. Mental status is at baseline.  Psychiatric:        Mood and Affect: Mood normal.        Behavior: Behavior normal.        Thought Content: Thought content normal.        Judgment: Judgment normal.    Imaging studies: 06/19/2019:Low dose chest CTrevealed Lung-RADS 4A-S, suspicious. There was a new  centrilobular nodularity in both lungs, more likely inflammatory. Three was a 8.5 x 5.7 cm poorly marginated geographic region of low attenuation in the superior right liver, new since 06/17/2017.  09/16/2018:Chest CTrevealed aLung-RADSscore of2, benign appearance or behavior. There was an ill-defined liver mass felt to represent a hepatoma on 07/10/2018, in the setting of cirrhosis. 07/10/2018:Abdomen MRI revealed an 8.8 cm heterogeneous infiltrating segment 8. Findings were consistent with a hepatoma, given the underlying cirrhotic changes involving the liver. This was also invading and obstructing the middle and right portal veins and the middle hepatic vein. There were scattered abdominal lymph nodes but no definite findings for metastatic disease. 11/28/2018:Abdominal MRI revealedresponse to therapy of segment 8 hepatocellular carcinoma with persistent tumor thrombus extension into the right portal vein. There was no evidence of new liver lesions or abdominal metastasis. 03/15/2019:Abdominal MRIrevealed no significant change in poorly defined mass in segment 8 of the right lobe, with persistent tumor thrombus throughout the right portal vein. There was no new liver lesions or abdominal metastatic disease. 06/15/2019:Abdominal MRIrevealedslight increase in peripheral biliary ductal dilation surrounding the infiltrative central hepatic neoplasm. Portal vein involvement may be slightly improved compared to the prior study. There was no significant change in size or new lesion.There was suggestion of subcarinal adenopathy though assessment of the chest on MRIwas limited. Consider chest CT for further evaluation. There was question of colonic thickening,whichcould herald mild colitis. Correlate clinically. 08/26/2019:  Abdominal MRI revealed a branching, irregular hypoenhancing mass of the right lobe of the liver with associated atrophy of the right lobe, biliary ductal  dilatation and right portal vein invasion/occlusion. The bulk and configuration of the mass was best appreciated on coronal images, with the largest components located very centrally and in the subcapsular dome of the right lobe.  The mass was not significantly changed. There was diffuse, somewhat heterogeneous arterial hyperenhancement of the right lobe of the liver, likely due to perfusion anomaly given occlusion of the right portal vein. There were multiple subcentimeter arterially enhancing foci throughout the left lobe of liver, without evidence of discrete mass, washout, or capsule.  10/07/2019:  Abdominal MRI revealed  question minimal increase in distension of LEFT intrahepatic and LEFT hepatic biliary ducts (minimal).  There was overall very little interval change in the appearance of the mass, portal vein invasion and RIGHT intrahepatic biliary ductal dilation.  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  There was no abdominal adenopathy.   Appointment on 10/08/2019  Component Date Value Ref Range Status  . Sodium 10/08/2019 134* 135 - 145 mmol/L Final  . Potassium 10/08/2019 3.3* 3.5 - 5.1 mmol/L Final  . Chloride 10/08/2019 103  98 - 111 mmol/L Final  . CO2 10/08/2019 24  22 - 32 mmol/L Final  . Glucose, Bld 10/08/2019 126* 70 - 99 mg/dL Final   Glucose reference range applies only to samples taken after fasting for at least 8 hours.  . BUN 10/08/2019 12  8 - 23 mg/dL Final  . Creatinine, Ser 10/08/2019 0.76  0.61 - 1.24 mg/dL Final  . Calcium 10/08/2019 8.6* 8.9 - 10.3 mg/dL Final  . Total Protein 10/08/2019 6.9  6.5 - 8.1 g/dL Final  . Albumin 10/08/2019 3.6  3.5 - 5.0 g/dL Final  . AST 10/08/2019 160* 15 - 41 U/L Final  . ALT 10/08/2019 211* 0 - 44 U/L Final  . Alkaline Phosphatase 10/08/2019 492* 38 - 126 U/L Final   . Total Bilirubin 10/08/2019 2.0* 0.3 - 1.2 mg/dL Final  . GFR calc non Af Amer 10/08/2019 >60  >60 mL/min Final  . GFR calc Af Amer 10/08/2019 >60  >60 mL/min Final  . Anion gap 10/08/2019 7  5 - 15 Final   Performed at [facility], [address], [place], [place] [address]  Appointment on 10/07/2019  Component Date Value Ref Range Status  . AFP, Serum, Tumor Marker 10/07/2019 483.0* 0.0 - 8.3 ng/mL Final   Comment: (NOTE) Roche [employer]ostics Electrochemiluminescence Immunoassay (ECLIA) Values obtained with different assay methods or kits cannot be used interchangeably.  Results cannot be interpreted as absolute evidence of the presence or absence of malignant disease. This test is not interpretable in pregnant females. Performed At: [facility] [place], [place] [id number] Rush Farmer MD [id number]   . WBC 10/07/2019 3.5* 4.0 - 10.5 K/uL Final  . RBC 10/07/2019 4.47  4.22 - 5.81 MIL/uL Final  . Hemoglobin 10/07/2019 13.9  13.0 - 17.0 g/dL Final  . HCT 10/07/2019 41.8  39 - 52 % Final  . MCV 10/07/2019 93.5  80.0 - 100.0 fL Final  . MCH 10/07/2019 31.1  26.0 - 34.0 pg Final  . MCHC 10/07/2019 33.3  30.0 - 36.0 g/dL Final  . RDW 10/07/2019 14.8  11.5 - 15.5 % Final  . Platelets 10/07/2019 118* 150 - 400 K/uL Final  . nRBC 10/07/2019 0.0  0.0 - 0.2 % Final  . Neutrophils Relative % 10/07/2019 62  % Final  . Neutro Abs 10/07/2019 2.1  1.7 - 7.7 K/uL Final  . Lymphocytes Relative 10/07/2019 29  % Final  . Lymphs Abs 10/07/2019 1.0  0.7 - 4.0 K/uL Final  . Monocytes Relative 10/07/2019 6  % Final  . Monocytes Absolute 10/07/2019 0.2  0 - 1 K/uL Final  . Eosinophils Relative 10/07/2019 2  % Final  . Eosinophils Absolute 10/07/2019 0.1  0 - 0 K/uL Final  . Basophils Relative 10/07/2019 1  % Final  . Basophils  Absolute 10/07/2019 0.0  0 - 0 K/uL Final  . Immature Granulocytes 10/07/2019 0  % Final  . Abs Immature Granulocytes 10/07/2019 0.01   0.00 - 0.07 K/uL Final   Performed at [facility], [address] [place], [place] [address]  . Sodium 10/07/2019 134* 135 - 145 mmol/L Final  . Potassium 10/07/2019 3.2* 3.5 - 5.1 mmol/L Final  . Chloride 10/07/2019 99  98 - 111 mmol/L Final  . CO2 10/07/2019 27  22 - 32 mmol/L Final  . Glucose, Bld 10/07/2019 80  70 - 99 mg/dL Final   Glucose reference range applies only to samples taken after fasting for at least 8 hours.  . BUN 10/07/2019 10  8 - 23 mg/dL Final  . Creatinine, Ser 10/07/2019 0.81  0.61 - 1.24 mg/dL Final  . Calcium 10/07/2019 8.9  8.9 - 10.3 mg/dL Final  . Total Protein 10/07/2019 7.5  6.5 - 8.1 g/dL Final  . Albumin 10/07/2019 3.8  3.5 - 5.0 g/dL Final  . AST 10/07/2019 150* 15 - 41 U/L Final  . ALT 10/07/2019 206* 0 - 44 U/L Final  . Alkaline Phosphatase 10/07/2019 485* 38 - 126 U/L Final  . Total Bilirubin 10/07/2019 1.2  0.3 - 1.2 mg/dL Final  . GFR calc non Af Amer 10/07/2019 >60  >60 mL/min Final  . GFR calc Af Amer 10/07/2019 >60  >60 mL/min Final  . Anion gap 10/07/2019 8  5 - 15 Final   Performed at [facility], [address], [place], [place] [address]    Assessment:  Chris Mcdonald is a 64 y.o. male with unresectable hepatocellular carcinoma. He has a history of hepatitis C(treated with Harvoni in 04/11/2015). He has hepatitis B. Mass was discovered incidentally on low dose chest CT screening program. AFPwas 7329. Child-Pugh ClassisA (score 5).  Low dose chest CTon 06/18/2018 revealed Lung-RADS 4A-S, suspicious. There was a new centrilobular nodularity in both lungs, more likely inflammatory. Three was a 8.5 x 5.7 cm poorly marginated geographic region of low attenuation in the superior right liver, new since 06/17/2017. Chest CTon 09/16/2018 revealed aLung-RADSscore of2, benign appearance or behavior. There was an ill-defined liver mass felt to represent a hepatoma on 07/10/2018, in the setting of  cirrhosis.  Abdomen MRI on 07/10/2018 revealed an 8.8 cm heterogeneous infiltrating segment 8. Findings were consistent with a hepatoma, given the underlying cirrhotic changes involving the liver. This was also invading and obstructing the middle and right portal veins and the middle hepatic vein. There were scattered abdominal lymph nodes but no definite findings for metastatic disease.  AFPhas been followed: 2094 on 07/27/2018,6250 on 08/17/2018, 467 on 11/18/2018, 271 on 12/29/2018, 2320 on 02/08/2019, 1548 on 03/08/2019, 721 on 04/07/2019, 500 on 05/05/2019, 501.0 on 06/14/2019, 642 on 07/08/2019, 603 on 07/15/2019, 612 on 08/12/2019, and 483 on 10/07/2019.  He began lenvatinibon 08/25/2018.Hewasoff lenvatinibfrom09/09/2018 - 02/14/2019.Lenvatinib was decreased to 4 mg a day on 06/17/2019.  He discontinued lenvatinib on 08/22/2019 secondary to intolerance.  He began cabozantinib on 40 mg/dayon05/17/2021. He experienced nausea and abdominal discomfort after 6 days.  Dose was decreased to 20 mg/day on 09/20/2019.  He discontinued cabozantinib on 09/29/2019 secondary to GI intolerance.  Abdominal MRI on 08/26/2019 revealed a branching, irregular hypoenhancing mass of the right lobe of the liver with associated atrophy of the right lobe, biliary ductal dilatation and right portal vein invasion/occlusion. The mass was not significantly changed. There was diffuse, somewhat heterogeneous arterial hyperenhancement of the right  lobe of the liver, likely due to perfusion anomaly. There were multiple subcentimeter arterially enhancing foci throughout the left lobe of liver, without evidence of discrete mass, washout, or capsule.   Abdominal MRI on 10/07/2019 revealed question minimal increase in distension of LEFT intrahepatic and LEFT hepatic biliary ducts (minimal).  There was overall very little interval change in the appearance of the mass, portal vein invasion and RIGHT intrahepatic  biliary ductal dilation.  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  There was no abdominal adenopathy.  He has a history of hypercalcemia. Calcium was 11.0 on 08/17/2018. PTH and PTH-rp were low on 08/19/2018.Calcium was 9.3 on 11/30/2018.  EKGon 06/17/2019 revealed no increased QTc.  Symptomatically, he feels "good" off cabozantinib.  He denies any nausea, vomiting or diarrhea.  He has gained 1 pound.  Plan: 1.   Labs today: CBC with diff, CMP. 2.   Hepatocellular carcinoma He stopped lenvatinib on 08/22/2019 secondary to poor tolerance. Abdominal MRIon 08/26/2019 revealed a stable mass.             He remains Child Pugh class A (score 5).             He began cabozantinib on 09/06/2019.                         He was unable to tolerate cabozantinib 40 mg/day as well as 20 mg/day.                         He stopped cabozantinib on 09/29/2019 secondary to nausea and vomiting.             Abdominal MRI on 10/07/2019 personally reviewed.  Agree with radiology interpretation.   There appears to be no progression of disease.   Portal vein occlusion has not progressed.   Contact radiology to review.  Contact GI (Dr Allen Norris).             Discuss postponing nivolumab secondary to unexplained increasing LFTs.  3.   Increasing LFTs  09/16/2019: AST 108.  ALT 123.  Alkaline phosphatase 349.  Bilirubin 0.9.  09/27/2019: AST 148.  ALT 224.  Alkaline phosphatase 539.  Bilirubin 1.0.  10/07/2019: AST 150.  ALT 206.  Alkaline phosphatase 485.  Bilirubin 1.2.  10/08/2019: AST 160.  ALT 211.  Alkaline phosphatase 492.  Bilirubin 2.0.  Imaging reveals no obvious increase in disease and no progression of thrombus (bland versus tumor).   Review with radiology as well as gastroenterology.   Review with  pharmacy.  Check hepatitis B and C serologies as well as viral load.  Discuss issues regarding active hepatitis B or C with treatment. 4. Weight loss  Weight improved off cabozantinib.  Continue to monitor.Marland Kitchen 5.Hypokalemia            Potassium 3.3             Increase potassium to 20 meq BID. 6.   No treatment today. 7.   Await hepatitis B evaluation prior to initiation of therapy. 8.   Referral GI ([facility])- discussed with him; he will see week of 06/28. 9.   RTC in 1 week for MD assessment, labs (CBC with diff, CMP) and nivolumab.  I discussed the assessment and treatment plan with the patient.  The patient was provided an opportunity to ask questions and all were answered.  The patient agreed with the plan and demonstrated an understanding of the instructions.  The patient was advised to call back if the symptoms worsen or if the condition fails to improve as anticipated.  I provided 15 minutes of face-to-face time during this this encounter and > 50% was spent counseling as documented under my assessment and plan. An additional 15 minutes were spent reviewing his chart (Epic and Care Everywhere) including notes, labs, and imaging studies.    Lequita Asal, MD, PhD    10/08/2019, 1:57 PM  I, Selena Batten, am acting as scribe for [employer]. Mike Gip, MD, PhD.  I, Vonda Harth C. Mike Gip, MD, have reviewed the above documentation for accuracy and completeness, and I agree with the above.

## 2019-10-07 NOTE — Progress Notes (Signed)
[facility]  [address], [address] [place], [place] [address] Phone: [phone]  Fax: [phone]   Clinic Day:  10/07/2019  Referring physician: Donnie Coffin, MD  Chief Complaint: Chris Mcdonald is a 64 y.o. male with stage IIIBhepatocellular carcinoma who is seen for assessment prior to initiation of cycle #1 nivolumab.   HPI: The patient was last seen in the medical oncology clinic on 09/27/2019. At that time, he noted abdominal discomfort associated with cabozantinib after reinitiation at the lower dose. Symptoms were better than with lenvatinib. Hematocrit was 46.6, hemoglobin 15.3, platelets 121,000, WBC 3,600. Potassium was 3.1, calcium 8.7, AST 148, ALT 224, alkaline phosphatase 539. Oral potassium was increased.  He contacted the clinic on 09/30/2019 with abdominal pain and cramping.  He decided to discontinue cabozantinib.  He wished to try nivolumab.  During the interim, he has been good. He stopped taking cabozantinib on 09/28/2019 or 09/29/2019 and has started feeling better.  He denies nausea and diarrhea. He has been eating "okay" and has been taking potassium daily. He drinks 2 Ensure drinks daily. The patient agreed to a liver MRI today and possible treatment tomorrow.   Past Medical History:  Diagnosis Date  . Arthritis   . Hepatitis   . Hypertension   . Liver cancer ([facility])   . Spinal stenosis   . Stroke [facility])     Past Surgical History:  Procedure Laterality Date  . COLONOSCOPY WITH PROPOFOL N/A 02/21/2015   Procedure: COLONOSCOPY WITH PROPOFOL;  Surgeon: Lollie Sails, MD;  Location: [facility] ENDOSCOPY;  Service: Endoscopy;  Laterality: N/A;  . INGUINAL HERNIA REPAIR Bilateral   . IR RADIOLOGIST EVAL & MGMT  08/04/2018  . POSTERIOR CERVICAL VERTEBRAE EXCISION      Family History  Problem Relation Age of Onset  . Cancer Brother   . Heart disease Brother   . Cancer Mother     Social History:  reports that he has been smoking  cigarettes. He has a 75.00 pack-year smoking history. He has never used smokeless tobacco. He reports that he does not drink alcohol and does not use drugs.  [name] in [place] with his wife and daughter.He has a 25 year old daughter, 84 year old son, and 20 year old son. The patient is alone today.  Allergies: No Known Allergies  Current Medications: Current Outpatient Medications  Medication Sig Dispense Refill  . aspirin EC 81 MG tablet Take 81 mg by mouth daily.     Marland Kitchen atorvastatin (LIPITOR) 40 MG tablet Take 1 tablet (40 mg total) by mouth daily at 6 PM. 30 tablet 0  . clopidogrel (PLAVIX) 75 MG tablet Take 1 tablet (75 mg total) by mouth daily. 30 tablet 0  . fluticasone (FLONASE) 50 MCG/ACT nasal spray 1 spray by Each Nare route daily.    Marland Kitchen gabapentin (NEURONTIN) 100 MG capsule Take 1 capsule (100 mg total) by mouth 2 (two) times daily. 60 capsule 0  . hydrochlorothiazide (HYDRODIURIL) 12.5 MG tablet Take 25 mg by mouth daily.     . mupirocin ointment (BACTROBAN) 2 % Apply twice a day to umbilical area 22 g 0  . omeprazole (PRILOSEC) 20 MG capsule TAKE 1 CAPSULE BY MOUTH EVERY DAY 90 capsule 1  . ondansetron (ZOFRAN) 8 MG tablet Take 1 tablet (8 mg total) by mouth every 8 (eight) hours as needed for nausea or vomiting. 20 tablet 1  . potassium chloride SA (KLOR-CON M20) 20 MEQ tablet TAKE 1 TABLET (20 MEQ  TOTAL) BY MOUTH DAILY. AS DIRECTED BY MD 40 tablet 0  . cabozantinib (CABOMETYX) 20 MG tablet Take 2 tablets (40 mg total) by mouth daily. Take on an empty stomach, 1 hour before or 2 hours after meals. (Patient not taking: Reported on 10/06/2019) 60 tablet 0   No current facility-administered medications for this visit.    Review of Systems  Constitutional: Negative.  Negative for chills, diaphoresis, fever, malaise/fatigue and weight loss (up 1 lb).       Doing good  HENT: Negative.  Negative for congestion, ear discharge, ear pain, hearing loss, nosebleeds, sinus pain, sore  throat and tinnitus.   Eyes: Negative.  Negative for blurred vision.  Respiratory: Negative.  Negative for cough, hemoptysis, sputum production and shortness of breath.   Cardiovascular: Negative.  Negative for chest pain, palpitations and leg swelling.  Gastrointestinal: Negative.  Negative for abdominal pain, blood in stool, constipation, diarrhea, heartburn, melena, nausea and vomiting.       Eating "okay." Drinking Boost x 2 per day.   Genitourinary: Negative.  Negative for dysuria, frequency, hematuria and urgency.  Musculoskeletal: Negative.  Negative for back pain, joint pain, myalgias and neck pain.  Skin: Negative.  Negative for itching and rash.  Neurological: Negative.  Negative for dizziness, tingling, sensory change, weakness and headaches.  Endo/Heme/Allergies: Negative.  Does not bruise/bleed easily.  Psychiatric/Behavioral: Negative.  Negative for depression and memory loss. The patient is not nervous/anxious and does not have insomnia.   All other systems reviewed and are negative.  Performance status (ECOG): 0  Vitals Blood pressure 124/87, pulse 60, temperature (!) 97 F (36.1 C), temperature source Tympanic, weight 110 lb 14.3 oz (50.3 kg), SpO2 100 %.   Physical Exam Vitals and nursing note reviewed.  Constitutional:      General: He is not in acute distress.    Appearance: Normal appearance.     Interventions: Face mask in place.  HENT:     Head: Normocephalic and atraumatic.     Comments: Short brown hair. Gray beard. Edentulous. Temporal wasting.    Mouth/Throat:     Mouth: Mucous membranes are moist.     Pharynx: Oropharynx is clear.  Eyes:     General: No scleral icterus.    Extraocular Movements: Extraocular movements intact.     Conjunctiva/sclera: Conjunctivae normal.     Pupils: Pupils are equal, round, and reactive to light.     Comments: Blue eyes.  Cardiovascular:     Rate and Rhythm: Normal rate and regular rhythm.     Pulses: Normal pulses.       Heart sounds: Normal heart sounds. No murmur heard.   Pulmonary:     Effort: Pulmonary effort is normal. No respiratory distress.     Breath sounds: Normal breath sounds. No wheezing or rales.  Chest:     Chest wall: No tenderness.  Abdominal:     General: Bowel sounds are normal. There is no distension.     Palpations: Abdomen is soft. There is no mass.     Tenderness: There is no abdominal tenderness. There is no guarding.     Comments: Liver is 3 finger breadths below right costal margin.  Musculoskeletal:        General: No swelling or tenderness. Normal range of motion.     Cervical back: Normal range of motion and neck supple.  Lymphadenopathy:     Head:     Right side of head: No preauricular, posterior auricular or occipital  adenopathy.     Left side of head: No preauricular, posterior auricular or occipital adenopathy.     Cervical: No cervical adenopathy.     Upper Body:     Right upper body: No supraclavicular adenopathy.     Left upper body: No supraclavicular adenopathy.     Lower Body: No right inguinal adenopathy. No left inguinal adenopathy.  Skin:    General: Skin is warm and dry.  Neurological:     Mental Status: He is alert and oriented to person, place, and time. Mental status is at baseline.  Psychiatric:        Mood and Affect: Mood normal.        Behavior: Behavior normal.        Thought Content: Thought content normal.        Judgment: Judgment normal.     Imaging studies: 06/19/2019:Low dose chest CTrevealed Lung-RADS 4A-S, suspicious. There was a new centrilobular nodularity in both lungs, more likely inflammatory. Three was a 8.5 x 5.7 cm poorly marginated geographic region of low attenuation in the superior right liver, new since 06/17/2017.  09/16/2018:Chest CTrevealed aLung-RADSscore of2, benign appearance or behavior. There was an ill-defined liver mass felt to represent a hepatoma on 07/10/2018, in the setting of  cirrhosis. 07/10/2018:Abdomen MRI revealed an 8.8 cm heterogeneous infiltrating segment 8. Findings were consistent with a hepatoma, given the underlying cirrhotic changes involving the liver. This was also invading and obstructing the middle and right portal veins and the middle hepatic vein. There were scattered abdominal lymph nodes but no definite findings for metastatic disease. 11/28/2018:Abdominal MRI revealedresponse to therapy of segment 8 hepatocellular carcinoma with persistent tumor thrombus extension into the right portal vein. There was no evidence of new liver lesions or abdominal metastasis. 03/15/2019:Abdominal MRIrevealed no significant change in poorly defined mass in segment 8 of the right lobe, with persistent tumor thrombus throughout the right portal vein. There was no new liver lesions or abdominal metastatic disease. 06/15/2019:Abdominal MRIrevealedslight increase in peripheral biliary ductal dilation surrounding the infiltrative central hepatic neoplasm. Portal vein involvement may be slightly improved compared to the prior study. There was no significant change in size or new lesion.There was suggestion of subcarinal adenopathy though assessment of the chest on MRIwas limited. Consider chest CT for further evaluation. There was question of colonic thickening,whichcould herald mild colitis. Correlate clinically. 08/26/2019:  Abdominal MRI revealed a branching, irregular hypoenhancing mass of the right lobe of the liver with associated atrophy of the right lobe, biliary ductal dilatation and right portal vein invasion/occlusion. The bulk and configuration of the mass was best appreciated on coronal images, with the largest components located very centrally and in the subcapsular dome of the right lobe.  The mass was not significantly changed. There was diffuse, somewhat heterogeneous arterial hyperenhancement of the right lobe of the liver, likely due to  perfusion anomaly given occlusion of the right portal vein. There were multiple subcentimeter arterially enhancing foci throughout the left lobe of liver, without evidence of discrete mass, washout, or capsule.    Appointment on 10/07/2019  Component Date Value Ref Range Status  . WBC 10/07/2019 3.5* 4.0 - 10.5 K/uL Final  . RBC 10/07/2019 4.47  4.22 - 5.81 MIL/uL Final  . Hemoglobin 10/07/2019 13.9  13.0 - 17.0 g/dL Final  . HCT 10/07/2019 41.8  39 - 52 % Final  . MCV 10/07/2019 93.5  80.0 - 100.0 fL Final  . MCH 10/07/2019 31.1  26.0 - 34.0 pg Final  .  MCHC 10/07/2019 33.3  30.0 - 36.0 g/dL Final  . RDW 10/07/2019 14.8  11.5 - 15.5 % Final  . Platelets 10/07/2019 118* 150 - 400 K/uL Final  . nRBC 10/07/2019 0.0  0.0 - 0.2 % Final  . Neutrophils Relative % 10/07/2019 62  % Final  . Neutro Abs 10/07/2019 2.1  1.7 - 7.7 K/uL Final  . Lymphocytes Relative 10/07/2019 29  % Final  . Lymphs Abs 10/07/2019 1.0  0.7 - 4.0 K/uL Final  . Monocytes Relative 10/07/2019 6  % Final  . Monocytes Absolute 10/07/2019 0.2  0 - 1 K/uL Final  . Eosinophils Relative 10/07/2019 2  % Final  . Eosinophils Absolute 10/07/2019 0.1  0 - 0 K/uL Final  . Basophils Relative 10/07/2019 1  % Final  . Basophils Absolute 10/07/2019 0.0  0 - 0 K/uL Final  . Immature Granulocytes 10/07/2019 0  % Final  . Abs Immature Granulocytes 10/07/2019 0.01  0.00 - 0.07 K/uL Final   Performed at [facility], [address], [place], [place] [address]  . Sodium 10/07/2019 134* 135 - 145 mmol/L Final  . Potassium 10/07/2019 3.2* 3.5 - 5.1 mmol/L Final  . Chloride 10/07/2019 99  98 - 111 mmol/L Final  . CO2 10/07/2019 27  22 - 32 mmol/L Final  . Glucose, Bld 10/07/2019 80  70 - 99 mg/dL Final   Glucose reference range applies only to samples taken after fasting for at least 8 hours.  . BUN 10/07/2019 10  8 - 23 mg/dL Final  . Creatinine, Ser 10/07/2019 0.81  0.61 - 1.24 mg/dL Final  . Calcium 10/07/2019 8.9  8.9 -  10.3 mg/dL Final  . Total Protein 10/07/2019 7.5  6.5 - 8.1 g/dL Final  . Albumin 10/07/2019 3.8  3.5 - 5.0 g/dL Final  . AST 10/07/2019 150* 15 - 41 U/L Final  . ALT 10/07/2019 206* 0 - 44 U/L Final  . Alkaline Phosphatase 10/07/2019 485* 38 - 126 U/L Final  . Total Bilirubin 10/07/2019 1.2  0.3 - 1.2 mg/dL Final  . GFR calc non Af Amer 10/07/2019 >60  >60 mL/min Final  . GFR calc Af Amer 10/07/2019 >60  >60 mL/min Final  . Anion gap 10/07/2019 8  5 - 15 Final   Performed at [facility], [address], [place], [place] [address]    Assessment:  Chris Mcdonald is a 64 y.o. male with unresectable hepatocellular carcinoma. He has a history of hepatitis C(treated with Harvoni in 04/11/2015). He has hepatitis B. Mass was discovered incidentally on low dose chest CT screenig program. AFPwas 7329. Child-Pugh ClassisA (score 5).  Low dose chest CTon 06/18/2018 revealed Lung-RADS 4A-S, suspicious. There was a new centrilobular nodularity in both lungs, more likely inflammatory. Three was a 8.5 x 5.7 cm poorly marginated geographic region of low attenuation in the superior right liver, new since 06/17/2017. Chest CTon 09/16/2018 revealed aLung-RADSscore of2, benign appearance or behavior. There was an ill-defined liver mass felt to represent a hepatoma on 07/10/2018, in the setting of cirrhosis.  Abdomen MRI on 07/10/2018 revealed an 8.8 cm heterogeneous infiltrating segment 8. Findings were consistent with a hepatoma, given the underlying cirrhotic changes involving the liver. This was also invading and obstructing the middle and right portal veins and the middle hepatic vein. There were scattered abdominal lymph nodes but no definite findings for metastatic disease.  AFPhas been followed: 7564 on 07/27/2018,6250 on 08/17/2018, 467 on 11/18/2018, 271 on 12/29/2018, 2320  on 02/08/2019, 1548 on 03/08/2019, 721 on 04/07/2019, 500 on 05/05/2019, 501.0 on 06/14/2019, 642  on 07/08/2019, 603 on 07/15/2019, 612 on 08/12/2019, and 483 on 10/07/2019.  He began lenvatinibon 08/25/2018.Hewasoff lenvatinibfrom09/09/2018 - 02/14/2019.Lenvatinib was decreased to 4 mg a day on 06/17/2019.  He discontinued lenvatinib on 08/22/2019 secondary to intolerance.  He began cabozantinib on 40 mg/dayon05/17/2021. He experienced nausea and abdominal discomfort after 6 days.  Dose was decreased to 20 mg/day on 09/20/2019.  He discontinued cabozantinib on 09/29/2019 secondary to GI intolerance.  Abdominal MRI on 08/26/2019 revealed a branching, irregular hypoenhancing mass of the right lobe of the liver with associated atrophy of the right lobe, biliary ductal dilatation and right portal vein invasion/occlusion. The mass was not significantly changed. There was diffuse, somewhat heterogeneous arterial hyperenhancement of the right lobe of the liver, likely due to perfusion anomaly. There were multiple subcentimeter arterially enhancing foci throughout the left lobe of liver, without evidence of discrete mass, washout, or capsule.   He has a history of hypercalcemia. Calcium was 11.0 on 08/17/2018. PTH and PTH-rp were low on 08/19/2018.Calcium was 9.3 on 11/30/2018.  EKGon 06/17/2019 revealed no increased QTc.  Symptomatically, he is feeling much better since discontinuation of cabozantinib.  He would like to try additional treatment for his hepatocellular carcinoma.  Plan: 1.   Labs today: CBC with diff, CMP, TSH, free T4, AFP. 2.   Hepatocellular carcinoma He stopped lenvatinib on 08/22/2019 secondary to poor tolerance. Abdominal MRIon 08/26/2019 revealed a stable mass.             He remains Child Pugh class A (score 5).             He began cabozantinib on 09/06/2019.                         He was unable to tolerate cabozantinib 40 mg/day as well as 20 mg/day.                         He stopped cabozantinib on 09/29/2019.             He is  feeling better after discontinuation of cabozantinib.             Discuss prior conversation about immunotherapy (nivolumab) as he wishes to pursue therapy.                        Potential side effects reviewed.    3. Abdominal discomfort Abdominal discomfort appears related to cabozantinib.  Symptoms have resolved. 4. Weight loss  Weight up 1 pound.  Encourage ongoing caloric intake.  He appears to tolerate meals better and eat more cabozantinib. 5.   Elevated LFTs  Etiology likley secondary to hepatocellular carcinoma.     Obtain liver MRI to assess disease.  If imaging stable, check hepatitis serologies. 6.   STAT liver MRI today. 7.   RTC tomorrow for MD assessment, labs (CMP) and +/- cycle #1 nivolumab.   I discussed the assessment and treatment plan with the patient.  The patient was provided an opportunity to ask questions and all were answered.  The patient agreed with the plan and demonstrated an understanding of the instructions.  The patient was advised to call back if the symptoms worsen or if the condition fails to improve as anticipated.   Lequita Asal, MD, PhD    10/07/2019, 10:43 AM  I,  De Burrs, am acting as Education administrator for [employer]. Mike Gip, MD, PhD.  I, Orville Widmann C. Mike Gip, MD, have reviewed the above documentation for accuracy and completeness, and I agree with the above.

## 2019-10-08 ENCOUNTER — Inpatient Hospital Stay: Payer: BC Managed Care – PPO

## 2019-10-08 ENCOUNTER — Telehealth: Payer: Self-pay | Admitting: Hematology and Oncology

## 2019-10-08 ENCOUNTER — Other Ambulatory Visit: Payer: Self-pay | Admitting: Hematology and Oncology

## 2019-10-08 ENCOUNTER — Inpatient Hospital Stay (HOSPITAL_BASED_OUTPATIENT_CLINIC_OR_DEPARTMENT_OTHER): Payer: BC Managed Care – PPO | Admitting: Hematology and Oncology

## 2019-10-08 ENCOUNTER — Other Ambulatory Visit: Payer: Self-pay

## 2019-10-08 VITALS — BP 113/72 | HR 65 | Temp 98.2°F | Wt 111.7 lb

## 2019-10-08 DIAGNOSIS — R945 Abnormal results of liver function studies: Secondary | ICD-10-CM

## 2019-10-08 DIAGNOSIS — C220 Liver cell carcinoma: Secondary | ICD-10-CM | POA: Diagnosis not present

## 2019-10-08 DIAGNOSIS — R7989 Other specified abnormal findings of blood chemistry: Secondary | ICD-10-CM

## 2019-10-08 DIAGNOSIS — E876 Hypokalemia: Secondary | ICD-10-CM | POA: Diagnosis not present

## 2019-10-08 DIAGNOSIS — Z7189 Other specified counseling: Secondary | ICD-10-CM

## 2019-10-08 DIAGNOSIS — Z5112 Encounter for antineoplastic immunotherapy: Secondary | ICD-10-CM | POA: Insufficient documentation

## 2019-10-08 DIAGNOSIS — I639 Cerebral infarction, unspecified: Secondary | ICD-10-CM

## 2019-10-08 LAB — COMPREHENSIVE METABOLIC PANEL
ALT: 211 U/L — ABNORMAL HIGH (ref 0–44)
AST: 160 U/L — ABNORMAL HIGH (ref 15–41)
Albumin: 3.6 g/dL (ref 3.5–5.0)
Alkaline Phosphatase: 492 U/L — ABNORMAL HIGH (ref 38–126)
Anion gap: 7 (ref 5–15)
BUN: 12 mg/dL (ref 8–23)
CO2: 24 mmol/L (ref 22–32)
Calcium: 8.6 mg/dL — ABNORMAL LOW (ref 8.9–10.3)
Chloride: 103 mmol/L (ref 98–111)
Creatinine, Ser: 0.76 mg/dL (ref 0.61–1.24)
GFR calc Af Amer: >60 mL/min (ref >60)
GFR calc non Af Amer: >60 mL/min (ref >60)
Glucose, Bld: 126 mg/dL — ABNORMAL HIGH (ref 70–99)
Potassium: 3.3 mmol/L — ABNORMAL LOW (ref 3.5–5.1)
Sodium: 134 mmol/L — ABNORMAL LOW (ref 135–145)
Total Bilirubin: 2 mg/dL — ABNORMAL HIGH (ref 0.3–1.2)
Total Protein: 6.9 g/dL (ref 6.5–8.1)

## 2019-10-08 LAB — AFP TUMOR MARKER: AFP, Serum, Tumor Marker: 483 ng/mL — ABNORMAL HIGH (ref 0.0–8.3)

## 2019-10-08 NOTE — Progress Notes (Signed)
Patient here for oncology follow-up appointment, expresses complaints of general chronic back pain.

## 2019-10-08 NOTE — Telephone Encounter (Signed)
I called [facility] to set up appt. The office had already closed. I asked that they call Chris Mcdonald with an appt. Dr Allen Norris said he would see him the week of 6/28.

## 2019-10-09 ENCOUNTER — Other Ambulatory Visit: Payer: Self-pay | Admitting: Hematology and Oncology

## 2019-10-09 DIAGNOSIS — B1920 Unspecified viral hepatitis C without hepatic coma: Secondary | ICD-10-CM | POA: Insufficient documentation

## 2019-10-09 LAB — HEPATITIS B CORE ANTIBODY, TOTAL: Hep B Core Total Ab: REACTIVE — AB

## 2019-10-09 LAB — HEPATITIS B DNA, ULTRAQUANTITATIVE, PCR
HBV DNA SERPL PCR-ACNC: NOT DETECTED IU/mL
HBV DNA SERPL PCR-LOG IU: UNDETERMINED log10 IU/mL

## 2019-10-09 LAB — HEPATITIS C ANTIBODY: HCV Ab: REACTIVE — AB

## 2019-10-09 LAB — PROTIME-INR
INR: 0.9 (ref 0.8–1.2)
Prothrombin Time: 11.9 seconds (ref 11.4–15.2)

## 2019-10-09 LAB — HEPATITIS B SURFACE ANTIGEN: Hepatitis B Surface Ag: NONREACTIVE

## 2019-10-09 LAB — HEPATITIS B E ANTIGEN: Hep B E Ag: NEGATIVE

## 2019-10-11 ENCOUNTER — Other Ambulatory Visit: Payer: Self-pay

## 2019-10-11 ENCOUNTER — Other Ambulatory Visit: Payer: Self-pay | Admitting: Hematology and Oncology

## 2019-10-11 DIAGNOSIS — B1920 Unspecified viral hepatitis C without hepatic coma: Secondary | ICD-10-CM

## 2019-10-12 ENCOUNTER — Inpatient Hospital Stay: Payer: BC Managed Care – PPO

## 2019-10-12 ENCOUNTER — Other Ambulatory Visit: Payer: Self-pay

## 2019-10-12 DIAGNOSIS — C220 Liver cell carcinoma: Secondary | ICD-10-CM | POA: Diagnosis not present

## 2019-10-12 DIAGNOSIS — B1920 Unspecified viral hepatitis C without hepatic coma: Secondary | ICD-10-CM

## 2019-10-12 LAB — HCV RNA QUANT RFLX ULTRA OR GENOTYP
HCV RNA Qnt(log copy/mL): UNDETERMINED log10 IU/mL
HepC Qn: NOT DETECTED IU/mL

## 2019-10-13 ENCOUNTER — Encounter: Payer: Self-pay | Admitting: Hematology and Oncology

## 2019-10-13 ENCOUNTER — Other Ambulatory Visit: Payer: Self-pay

## 2019-10-13 NOTE — Progress Notes (Signed)
No new changes noted today. The patient Name and DOB has been verified by phone today. 

## 2019-10-13 NOTE — Progress Notes (Signed)
[facility]  [address] [place], [place] [address] Phone: [phone]  Fax: [phone]   Clinic Day:  10/14/2019  Referring physician: Donnie Coffin, MD  Chief Complaint: Chris Mcdonald is a 64 y.o. male with stage IIIBhepatocellular carcinoma who is seen for assessment prior to cycle #1 nivolumab.  HPI: The patient was last seen in the medical oncology clinic on 10/08/2019. At that time, he felt "good".  He was no longer having abdominal discomfort that he related to cabozantinib.  LFTs included an AST of 160. ALT 211, alkaline phosphatase 492, and total bilirubin 2.0 . PT was 11.9 (INR 0.9).  Potassium was 3.3.  Hepatitis B core antibody was positive.  Hepatitis B surface antigen was negative.  Hepatitis B E antigen was negative.  Hepatitis B DNA was not detected.  Hepatitis C antibody was positive. Hepatitis C RNA was negative.  The patient is scheduled to see Dr. Allen Norris during the week of 10/18/2019.  During the interim, he has been "good." He denies any pain, nausea, vomiting, or diarrhea. He took potassium BID for 3 days, then went back to one pill daily.  I discussed treatment options for his hepatocellular carcinoma.  I spoke with two interventional radiologist regarding Y-90 directed therapy as Dr Jacqulynn Cadet, interventional radiologist who saw him on 07/25/2018, was not available. Dr Acey Lav noted consideration of treatment if his bilirubin was < 2.  He is not a candidate for bland embolization or chemoembolization due to portal vein invasion.  There would likely be a delay in treatment if Y-90 is pursued now.  We discussed his rising LFTs.    I spoke with Dr Zetta Bills who reviewed his scan again.  There is a definite increase in biliary ductal dilatation.  There is left hepatic ductal distension that was not present and there is increasing intrahepatic biliary ductal dilation to the right hepatic lobe progressing from November until  the current study.  He has no active hepatitis.  He wishes to pursue treatment.  We discussed the risks and benefits of pursuing Yttrium-90 vs nivolumab for treatment. The patient would like to proceed with nivolumab today.     Past Medical History:  Diagnosis Date  . Arthritis   . Hepatitis   . Hypertension   . Liver cancer ([place])   . Spinal stenosis   . Stroke Chris Mcdonald)     Past Surgical History:  Procedure Laterality Date  . COLONOSCOPY WITH PROPOFOL N/A 02/21/2015   Procedure: COLONOSCOPY WITH PROPOFOL;  Surgeon: Lollie Sails, MD;  Location: [facility] ENDOSCOPY;  Service: Endoscopy;  Laterality: N/A;  . INGUINAL HERNIA REPAIR Bilateral   . IR RADIOLOGIST EVAL & MGMT  08/04/2018  . POSTERIOR CERVICAL VERTEBRAE EXCISION      Family History  Problem Relation Age of Onset  . Cancer Brother   . Heart disease Brother   . Cancer Mother     Social History:  reports that he has been smoking cigarettes. He has a 75.00 pack-year smoking history. He has never used smokeless tobacco. He reports that he does not drink alcohol and does not use drugs.  [name] in [place] with his wife and daughter.He has a 48 year old daughter, 16 year old son, and 57 year old son. The patient is alone today.  Allergies: No Known Allergies  Current Medications: Current Outpatient Medications  Medication Sig Dispense Refill  . aspirin EC 81 MG tablet Take 81 mg by mouth daily.     Marland Kitchen  atorvastatin (LIPITOR) 40 MG tablet Take 1 tablet (40 mg total) by mouth daily at 6 PM. 30 tablet 0  . cabozantinib (CABOMETYX) 20 MG tablet Take 2 tablets (40 mg total) by mouth daily. Take on an empty stomach, 1 hour before or 2 hours after meals. 60 tablet 0  . clopidogrel (PLAVIX) 75 MG tablet Take 1 tablet (75 mg total) by mouth daily. 30 tablet 0  . fluticasone (FLONASE) 50 MCG/ACT nasal spray 1 spray by Each Nare route daily.    Marland Kitchen gabapentin (NEURONTIN) 100 MG capsule Take 1 capsule (100 mg total) by mouth 2 (two) times  daily. 60 capsule 0  . hydrochlorothiazide (HYDRODIURIL) 12.5 MG tablet Take 25 mg by mouth daily.     . potassium chloride SA (KLOR-CON M20) 20 MEQ tablet TAKE 1 TABLET (20 MEQ TOTAL) BY MOUTH DAILY. AS DIRECTED BY MD 40 tablet 0  . mupirocin ointment (BACTROBAN) 2 % Apply twice a day to umbilical area (Patient not taking: Reported on 10/13/2019) 22 g 0  . omeprazole (PRILOSEC) 20 MG capsule TAKE 1 CAPSULE BY MOUTH EVERY DAY (Patient not taking: Reported on 10/07/2019) 90 capsule 1  . ondansetron (ZOFRAN) 8 MG tablet Take 1 tablet (8 mg total) by mouth every 8 (eight) hours as needed for nausea or vomiting. (Patient not taking: Reported on 10/13/2019) 20 tablet 1   No current facility-administered medications for this visit.    Review of Systems  Constitutional: Negative.  Negative for chills, diaphoresis, fever, malaise/fatigue and weight loss (stable).       Doing good.  HENT: Negative.  Negative for congestion, ear discharge, ear pain, hearing loss, nosebleeds, sinus pain, sore throat and tinnitus.   Eyes: Negative.  Negative for blurred vision.  Respiratory: Negative.  Negative for cough, hemoptysis, sputum production and shortness of breath.   Cardiovascular: Negative.  Negative for chest pain, palpitations and leg swelling.  Gastrointestinal: Negative.  Negative for abdominal pain, blood in stool, constipation, diarrhea, heartburn, melena, nausea and vomiting.       Eating well. Drinking Boost x 2 per day.   Genitourinary: Negative.  Negative for dysuria, frequency, hematuria and urgency.  Musculoskeletal: Negative for back pain (chronic), joint pain, myalgias and neck pain.  Skin: Negative.  Negative for itching and rash.  Neurological: Negative.  Negative for dizziness, tingling, sensory change, weakness and headaches.  Endo/Heme/Allergies: Negative.  Does not bruise/bleed easily.  Psychiatric/Behavioral: Negative.  Negative for depression and memory loss. The patient is not  nervous/anxious and does not have insomnia.   All other systems reviewed and are negative.  Performance status (ECOG): 0  Vitals Blood pressure 125/86, pulse 60, temperature 97.7 F (36.5 C), temperature source Oral, weight 111 lb 5.3 oz (50.5 kg), SpO2 100 %.   Physical Exam Vitals and nursing note reviewed.  Constitutional:      General: He is not in acute distress.    Appearance: Normal appearance.     Interventions: Face mask in place.  HENT:     Head: Normocephalic and atraumatic.     Comments: Short brown hair. Gray beard. Edentulous. Temporal wasting. Mask.    Mouth/Throat:     Mouth: Mucous membranes are moist.     Pharynx: Oropharynx is clear.  Eyes:     General: No scleral icterus.    Extraocular Movements: Extraocular movements intact.     Conjunctiva/sclera: Conjunctivae normal.     Pupils: Pupils are equal, round, and reactive to light.     Comments: Blue  eyes.  Cardiovascular:     Rate and Rhythm: Normal rate and regular rhythm.     Pulses: Normal pulses.     Heart sounds: Normal heart sounds. No murmur heard.   Pulmonary:     Effort: Pulmonary effort is normal. No respiratory distress.     Breath sounds: Normal breath sounds. No wheezing or rales.  Chest:     Chest wall: No tenderness.  Abdominal:     General: Bowel sounds are normal. There is no distension.     Palpations: Abdomen is soft. There is no mass.     Tenderness: There is no abdominal tenderness. There is no guarding or rebound.  Musculoskeletal:        General: No swelling or tenderness. Normal range of motion.     Cervical back: Normal range of motion and neck supple.  Lymphadenopathy:     Head:     Right side of head: No preauricular, posterior auricular or occipital adenopathy.     Left side of head: No preauricular, posterior auricular or occipital adenopathy.     Cervical: No cervical adenopathy.     Upper Body:     Right upper body: No supraclavicular adenopathy.     Left upper body:  No supraclavicular adenopathy.     Lower Body: No right inguinal adenopathy. No left inguinal adenopathy.  Skin:    General: Skin is warm and dry.  Neurological:     Mental Status: He is alert and oriented to person, place, and time. Mental status is at baseline.  Psychiatric:        Mood and Affect: Mood normal.        Behavior: Behavior normal.        Thought Content: Thought content normal.        Judgment: Judgment normal.     Imaging studies: 06/19/2019:Low dose chest CTrevealed Lung-RADS 4A-S, suspicious. There was a new centrilobular nodularity in both lungs, more likely inflammatory. Three was a 8.5 x 5.7 cm poorly marginated geographic region of low attenuation in the superior right liver, new since 06/17/2017.  09/16/2018:Chest CTrevealed aLung-RADSscore of2, benign appearance or behavior. There was an ill-defined liver mass felt to represent a hepatoma on 07/10/2018, in the setting of cirrhosis. 07/10/2018:Abdomen MRI revealed an 8.8 cm heterogeneous infiltrating segment 8. Findings were consistent with a hepatoma, given the underlying cirrhotic changes involving the liver. This was also invading and obstructing the middle and right portal veins and the middle hepatic vein. There were scattered abdominal lymph nodes but no definite findings for metastatic disease. 11/28/2018:Abdominal MRI revealedresponse to therapy of segment 8 hepatocellular carcinoma with persistent tumor thrombus extension into the right portal vein. There was no evidence of new liver lesions or abdominal metastasis. 03/15/2019:Abdominal MRIrevealed no significant change in poorly defined mass in segment 8 of the right lobe, with persistent tumor thrombus throughout the right portal vein. There was no new liver lesions or abdominal metastatic disease. 06/15/2019:Abdominal MRIrevealedslight increase in peripheral biliary ductal dilation surrounding the infiltrative central hepatic  neoplasm. Portal vein involvement may be slightly improved compared to the prior study. There was no significant change in size or new lesion.There was suggestion of subcarinal adenopathy though assessment of the chest on MRIwas limited. Consider chest CT for further evaluation. There was question of colonic thickening,whichcould herald mild colitis. Correlate clinically. 08/26/2019:  Abdominal MRI revealed a branching, irregular hypoenhancing mass of the right lobe of the liver with associated atrophy of the right lobe, biliary ductal dilatation  and right portal vein invasion/occlusion. The bulk and configuration of the mass was best appreciated on coronal images, with the largest components located very centrally and in the subcapsular dome of the right lobe.  The mass was not significantly changed. There was diffuse, somewhat heterogeneous arterial hyperenhancement of the right lobe of the liver, likely due to perfusion anomaly given occlusion of the right portal vein. There were multiple subcentimeter arterially enhancing foci throughout the left lobe of liver, without evidence of discrete mass, washout, or capsule.  10/07/2019:  Abdominal MRI revealed question minimal increase in distension of LEFT intrahepatic and LEFT hepatic biliary ducts (minimal).  There was overall very little interval change in the appearance of the mass, portal vein invasion and RIGHT intrahepatic biliary ductal dilation.  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  There was no abdominal adenopathy.   Appointment on 10/14/2019  Component Date Value Ref Range Status  . Sodium 10/14/2019 136  135 - 145 mmol/L Final  . Potassium 10/14/2019 2.8* 3.5 - 5.1 mmol/L Final  . Chloride 10/14/2019 100  98 - 111 mmol/L Final  . CO2 10/14/2019 27  22 - 32 mmol/L  Final  . Glucose, Bld 10/14/2019 83  70 - 99 mg/dL Final   Glucose reference range applies only to samples taken after fasting for at least 8 hours.  . BUN 10/14/2019 15  8 - 23 mg/dL Final  . Creatinine, Ser 10/14/2019 0.83  0.61 - 1.24 mg/dL Final  . Calcium 10/14/2019 8.9  8.9 - 10.3 mg/dL Final  . Total Protein 10/14/2019 7.1  6.5 - 8.1 g/dL Final  . Albumin 10/14/2019 3.6  3.5 - 5.0 g/dL Final  . AST 10/14/2019 149* 15 - 41 U/L Final  . ALT 10/14/2019 214* 0 - 44 U/L Final  . Alkaline Phosphatase 10/14/2019 612* 38 - 126 U/L Final  . Total Bilirubin 10/14/2019 2.7* 0.3 - 1.2 mg/dL Final  . GFR calc non Af Amer 10/14/2019 >60  >60 mL/min Final  . GFR calc Af Amer 10/14/2019 >60  >60 mL/min Final  . Anion gap 10/14/2019 9  5 - 15 Final   Performed at [facility], [address] [place], [place] [address]  . WBC 10/14/2019 4.2  4.0 - 10.5 K/uL Final  . RBC 10/14/2019 4.05* 4.22 - 5.81 MIL/uL Final  . Hemoglobin 10/14/2019 12.7* 13.0 - 17.0 g/dL Final  . HCT 10/14/2019 38.1* 39 - 52 % Final  . MCV 10/14/2019 94.1  80.0 - 100.0 fL Final  . MCH 10/14/2019 31.4  26.0 - 34.0 pg Final  . MCHC 10/14/2019 33.3  30.0 - 36.0 g/dL Final  . RDW 10/14/2019 16.2* 11.5 - 15.5 % Final  . Platelets 10/14/2019 160  150 - 400 K/uL Final  . nRBC 10/14/2019 0.0  0.0 - 0.2 % Final  . Neutrophils Relative % 10/14/2019 67  % Final  . Neutro Abs 10/14/2019 2.9  1.7 - 7.7 K/uL Final  . Lymphocytes Relative 10/14/2019 23  % Final  . Lymphs Abs 10/14/2019 1.0  0.7 - 4.0 K/uL Final  . Monocytes Relative 10/14/2019 6  % Final  . Monocytes Absolute 10/14/2019 0.2  0 - 1 K/uL Final  . Eosinophils Relative 10/14/2019 2  % Final  . Eosinophils Absolute 10/14/2019 0.1  0 - 0 K/uL Final  .  Basophils Relative 10/14/2019 1  % Final  . Basophils Absolute 10/14/2019 0.0  0 - 0 K/uL Final  . Immature Granulocytes 10/14/2019 1  % Final  . Abs Immature Granulocytes 10/14/2019 0.02  0.00 - 0.07 K/uL Final    Performed at [facility], [address] [place], [place] [address]    Assessment:  Chris Mcdonald is a 64 y.o. male with unresectable hepatocellular carcinoma. He has a history of hepatitis C(treated with Harvoni in 04/11/2015). He has hepatitis B. Mass was discovered incidentally on low dose chest CT screening program. AFPwas 7329. Child-Pugh ClassisA (score 5).  Low dose chest CTon 06/18/2018 revealed Lung-RADS 4A-S, suspicious. There was a new centrilobular nodularity in both lungs, more likely inflammatory. Three was a 8.5 x 5.7 cm poorly marginated geographic region of low attenuation in the superior right liver, new since 06/17/2017. Chest CTon 09/16/2018 revealed aLung-RADSscore of2, benign appearance or behavior. There was an ill-defined liver mass felt to represent a hepatoma on 07/10/2018, in the setting of cirrhosis.  Abdomen MRI on 07/10/2018 revealed an 8.8 cm heterogeneous infiltrating segment 8. Findings were consistent with a hepatoma, given the underlying cirrhotic changes involving the liver. This was also invading and obstructing the middle and right portal veins and the middle hepatic vein. There were scattered abdominal lymph nodes but no definite findings for metastatic disease.  AFPhas been followed: 1914 on 07/27/2018,6250 on 08/17/2018, 467 on 11/18/2018, 271 on 12/29/2018, 2320 on 02/08/2019, 1548 on 03/08/2019, 721 on 04/07/2019, 500 on 05/05/2019, 501.0 on 06/14/2019, 642 on 07/08/2019, 603 on 07/15/2019, and 612 on 08/12/2019.  He began lenvatinibon 08/25/2018.Hewasoff lenvatinibfrom09/09/2018 - 02/14/2019.Lenvatinib was decreased to 4 mg a day on 06/17/2019.  He discontinued lenvatinib on 08/22/2019 secondary to intolerance.  He began cabozantinib on 40 mg/dayon05/17/2021. He experienced nausea and abdominal discomfort after 6 days.  Dose was decreased to 20 mg/day on 09/20/2019.  He discontinued cabozantinib on  09/30/2019 secondary to GI intolerance.  Abdominal MRI on 08/26/2019 revealed a branching, irregular hypoenhancing mass of the right lobe of the liver with associated atrophy of the right lobe, biliary ductal dilatation and right portal vein invasion/occlusion. The mass was not significantly changed. There was diffuse, somewhat heterogeneous arterial hyperenhancement of the right lobe of the liver, likely due to perfusion anomaly. There were multiple subcentimeter arterially enhancing foci throughout the left lobe of liver, without evidence of discrete mass, washout, or capsule.   Abdominal MRI on 10/07/2019 revealed question minimal increase in distension of LEFT intrahepatic and LEFT hepatic biliary ducts (minimal).  There was overall very little interval change in the appearance of the mass, portal vein invasion and RIGHT intrahepatic biliary ductal dilation.  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  There was no abdominal adenopathy.  He has elevated LFTs.  Hepatitis B core antibody was positive.  Hepatitis B surface antigen, hepatitis B E antigen, and hepatitis B DNA were negative.  Hepatitis C antibody was positive. Hepatitis C RNA was negative.  He has a history of hypercalcemia. Calcium was 11.0 on 08/17/2018. PTH and PTH-rp were low on 08/19/2018.Calcium was 9.3 on 11/30/2018.  EKGon 06/17/2019 revealed no increased QTc.  Symptomatically, he feels "good".  He denies any nausea or vomiting.  He wishes to pursue treatment.  Bilirubin is 2.7.  Plan: 1.   Labs today:  CBC with  diff, CMP. 2.   Hepatocellular carcinoma He was diagnosed with unresectable hepatocellular carcinoma in 05/2018.  He stopped lenvatinib on 08/22/2019 secondary to poor tolerance. Abdominal MRIon 08/26/2019 revealed a  stable mass.             He remains Child Pugh class A (score 5).             He began cabozantinib on 09/06/2019.                         He was unable to tolerate cabozantinib 40 mg/day as well as 20 mg/day. He stopped cabozantinib on 09/29/2019 secondary to nausea and vomiting. Abdominal MRI on 10/07/2019 revealed no progression of disease.                         Portal vein occlusion has not progressed.   Re-review of imaging revealed progression of biliary ductal dilatation.              Distal areas of dilation not amenable to procedure.                    Discuss plans for Y-90 selective embolization versus nivolumab.   Discussed with radiology and patient.   Patient wishes to proceed with nivolumab today.  Discuss potential side effects associated with nivolumab.   Patient consented to treatment.             Labs reviewed.  Cycle #1 nivolumab today.  Discuss symptom management.  He has antiemetics at home to use on a prn bases.  Interventions are adequate.    3. Increasing LFTs  09/16/2019: AST 108.  ALT 123.  Alkaline phosphatase 349.  Bilirubin 0.9.             09/27/2019: AST 148.  ALT 224.  Alkaline phosphatase 539.  Bilirubin 1.0.             10/07/2019: AST 150.  ALT 206.  Alkaline phosphatase 485.  Bilirubin 1.2.             10/08/2019: AST 160.  ALT 211.  Alkaline phosphatase 492.  Bilirubin 2.0.  10/14/2019: AST 149.  ALT 214.  Alkaline phosphatase 612.  Bilirubin 2.7.             Imaging reveals progression on intrahepatic disease and no progression of thrombus (bland versus tumor).   Limited options.  Discuss concern about progression of [place] despite treatment.             Hepatitis B and C serologies as well as viral load were negative.  Follow LFTs closely.  4.   Hypokalemia  Potassium chloride 20 meq IV.  Increase oral potassium. 5.   RTC in 1 week for labs (CMP). 6.   RTC in 2 weeks for MD assessment, labs (CBC with diff,  CMP), and cyc;e #2 nivolumab.  I discussed the assessment and treatment plan with the patient.  The patient was provided an opportunity to ask questions and all were answered.  The patient agreed with the plan and demonstrated an understanding of the instructions.  The patient was advised to call back if the symptoms worsen or if the condition fails to improve as anticipated.  I provided 21 minutes of face-to-face time during this this encounter and > 50% was spent counseling as documented under my assessment and plan. An additional 20-25 minutes were spent reviewing his chart (  Epic and Care Everywhere) including notes, labs, and imaging studies.   Lequita Asal, MD, PhD    10/14/2019, 9:56 AM  I, Mirian Mo Tufford, am acting as Education administrator for [employer]. Mike Gip, MD, PhD.  I, Melissa C. Mike Gip, MD, have reviewed the above documentation for accuracy and completeness, and I agree with the above.

## 2019-10-14 ENCOUNTER — Inpatient Hospital Stay: Payer: BC Managed Care – PPO

## 2019-10-14 ENCOUNTER — Inpatient Hospital Stay (HOSPITAL_BASED_OUTPATIENT_CLINIC_OR_DEPARTMENT_OTHER): Payer: BC Managed Care – PPO | Admitting: Hematology and Oncology

## 2019-10-14 ENCOUNTER — Other Ambulatory Visit: Payer: Self-pay

## 2019-10-14 ENCOUNTER — Encounter: Payer: Self-pay | Admitting: Hematology and Oncology

## 2019-10-14 ENCOUNTER — Other Ambulatory Visit: Payer: Self-pay | Admitting: Hematology and Oncology

## 2019-10-14 VITALS — BP 125/86 | HR 60 | Temp 97.7°F | Wt 111.3 lb

## 2019-10-14 VITALS — BP 129/79 | HR 61 | Temp 96.1°F | Resp 16

## 2019-10-14 DIAGNOSIS — C220 Liver cell carcinoma: Secondary | ICD-10-CM | POA: Diagnosis not present

## 2019-10-14 DIAGNOSIS — E876 Hypokalemia: Secondary | ICD-10-CM

## 2019-10-14 DIAGNOSIS — Z5112 Encounter for antineoplastic immunotherapy: Secondary | ICD-10-CM | POA: Diagnosis not present

## 2019-10-14 DIAGNOSIS — Z7189 Other specified counseling: Secondary | ICD-10-CM

## 2019-10-14 DIAGNOSIS — R112 Nausea with vomiting, unspecified: Secondary | ICD-10-CM

## 2019-10-14 DIAGNOSIS — I639 Cerebral infarction, unspecified: Secondary | ICD-10-CM

## 2019-10-14 DIAGNOSIS — R160 Hepatomegaly, not elsewhere classified: Secondary | ICD-10-CM

## 2019-10-14 LAB — COMPREHENSIVE METABOLIC PANEL
ALT: 214 U/L — ABNORMAL HIGH (ref 0–44)
AST: 149 U/L — ABNORMAL HIGH (ref 15–41)
Albumin: 3.6 g/dL (ref 3.5–5.0)
Alkaline Phosphatase: 612 U/L — ABNORMAL HIGH (ref 38–126)
Anion gap: 9 (ref 5–15)
BUN: 15 mg/dL (ref 8–23)
CO2: 27 mmol/L (ref 22–32)
Calcium: 8.9 mg/dL (ref 8.9–10.3)
Chloride: 100 mmol/L (ref 98–111)
Creatinine, Ser: 0.83 mg/dL (ref 0.61–1.24)
GFR calc Af Amer: >60 mL/min (ref >60)
GFR calc non Af Amer: >60 mL/min (ref >60)
Glucose, Bld: 83 mg/dL (ref 70–99)
Potassium: 2.8 mmol/L — ABNORMAL LOW (ref 3.5–5.1)
Sodium: 136 mmol/L (ref 135–145)
Total Bilirubin: 2.7 mg/dL — ABNORMAL HIGH (ref 0.3–1.2)
Total Protein: 7.1 g/dL (ref 6.5–8.1)

## 2019-10-14 LAB — CBC WITH DIFFERENTIAL/PLATELET
Abs Immature Granulocytes: 0.02 10*3/uL (ref 0.00–0.07)
Basophils Absolute: 0 10*3/uL (ref 0.0–0.1)
Basophils Relative: 1 %
Eosinophils Absolute: 0.1 10*3/uL (ref 0.0–0.5)
Eosinophils Relative: 2 %
HCT: 38.1 % — ABNORMAL LOW (ref 39.0–52.0)
Hemoglobin: 12.7 g/dL — ABNORMAL LOW (ref 13.0–17.0)
Immature Granulocytes: 1 %
Lymphocytes Relative: 23 %
Lymphs Abs: 1 10*3/uL (ref 0.7–4.0)
MCH: 31.4 pg (ref 26.0–34.0)
MCHC: 33.3 g/dL (ref 30.0–36.0)
MCV: 94.1 fL (ref 80.0–100.0)
Monocytes Absolute: 0.2 10*3/uL (ref 0.1–1.0)
Monocytes Relative: 6 %
Neutro Abs: 2.9 10*3/uL (ref 1.7–7.7)
Neutrophils Relative %: 67 %
Platelets: 160 10*3/uL (ref 150–400)
RBC: 4.05 MIL/uL — ABNORMAL LOW (ref 4.22–5.81)
RDW: 16.2 % — ABNORMAL HIGH (ref 11.5–15.5)
WBC: 4.2 10*3/uL (ref 4.0–10.5)
nRBC: 0 % (ref 0.0–0.2)

## 2019-10-14 MED ORDER — SODIUM CHLORIDE 0.9 % IV SOLN
240.0000 mg | Freq: Once | INTRAVENOUS | Status: AC
  Administered 2019-10-14: 240 mg via INTRAVENOUS
  Filled 2019-10-14: qty 24

## 2019-10-14 MED ORDER — POTASSIUM CHLORIDE CRYS ER 20 MEQ PO TBCR: EXTENDED_RELEASE_TABLET | ORAL | 0 refills | Status: DC

## 2019-10-14 MED ORDER — ONDANSETRON HCL 8 MG PO TABS: 8.0000 mg | ORAL_TABLET | Freq: Three times a day (TID) | ORAL | 1 refills | Status: AC | PRN

## 2019-10-14 MED ORDER — SODIUM CHLORIDE 0.9 % IV SOLN
Freq: Once | INTRAVENOUS | Status: AC
  Filled 2019-10-14: qty 500

## 2019-10-14 MED ORDER — SODIUM CHLORIDE 0.9 % IV SOLN
Freq: Once | INTRAVENOUS | Status: AC
  Filled 2019-10-14: qty 250

## 2019-10-14 NOTE — Progress Notes (Signed)
Patient has elevated liver enzymes and Bilirubin. Per MD will start opdivo and follow up with hepatic function in 2 weeks. If Liver enzymes increase to >8-10 ULN during treatment will likely need to hold treatment. If Bili increases to >3 ULN will need to hold treatment.

## 2019-10-15 LAB — HCV RNA QUANT RFLX ULTRA OR GENOTYP
HCV RNA Qnt(log copy/mL): UNDETERMINED log10 IU/mL
HepC Qn: NOT DETECTED IU/mL

## 2019-10-19 ENCOUNTER — Other Ambulatory Visit: Payer: Self-pay

## 2019-10-19 DIAGNOSIS — C220 Liver cell carcinoma: Secondary | ICD-10-CM

## 2019-10-19 DIAGNOSIS — E876 Hypokalemia: Secondary | ICD-10-CM

## 2019-10-21 ENCOUNTER — Inpatient Hospital Stay: Payer: BC Managed Care – PPO | Attending: Hematology and Oncology

## 2019-10-21 ENCOUNTER — Other Ambulatory Visit: Payer: Self-pay

## 2019-10-21 ENCOUNTER — Other Ambulatory Visit: Payer: Self-pay | Admitting: Hematology and Oncology

## 2019-10-21 ENCOUNTER — Telehealth: Payer: Self-pay

## 2019-10-21 DIAGNOSIS — C220 Liver cell carcinoma: Secondary | ICD-10-CM

## 2019-10-21 DIAGNOSIS — E876 Hypokalemia: Secondary | ICD-10-CM

## 2019-10-21 DIAGNOSIS — R7989 Other specified abnormal findings of blood chemistry: Secondary | ICD-10-CM

## 2019-10-21 LAB — COMPREHENSIVE METABOLIC PANEL
ALT: 195 U/L — ABNORMAL HIGH (ref 0–44)
AST: 191 U/L — ABNORMAL HIGH (ref 15–41)
Albumin: 3.4 g/dL — ABNORMAL LOW (ref 3.5–5.0)
Alkaline Phosphatase: 842 U/L — ABNORMAL HIGH (ref 38–126)
Anion gap: 10 (ref 5–15)
BUN: 20 mg/dL (ref 8–23)
CO2: 23 mmol/L (ref 22–32)
Calcium: 9 mg/dL (ref 8.9–10.3)
Chloride: 100 mmol/L (ref 98–111)
Creatinine, Ser: 0.64 mg/dL (ref 0.61–1.24)
GFR calc Af Amer: >60 mL/min (ref >60)
GFR calc non Af Amer: >60 mL/min (ref >60)
Glucose, Bld: 115 mg/dL — ABNORMAL HIGH (ref 70–99)
Potassium: 3.1 mmol/L — ABNORMAL LOW (ref 3.5–5.1)
Sodium: 133 mmol/L — ABNORMAL LOW (ref 135–145)
Total Bilirubin: 8.4 mg/dL — ABNORMAL HIGH (ref 0.3–1.2)
Total Protein: 7.5 g/dL (ref 6.5–8.1)

## 2019-10-21 NOTE — Telephone Encounter (Signed)
I spoke with the patient to let him know that his Bilirubin has increased and that his potassium is low. Patient stated he is taking 20mg  of potassium 2 tablets a day.

## 2019-10-22 ENCOUNTER — Telehealth: Payer: Self-pay | Admitting: *Deleted

## 2019-10-22 ENCOUNTER — Encounter: Payer: Self-pay | Admitting: Internal Medicine

## 2019-10-22 ENCOUNTER — Other Ambulatory Visit: Payer: Self-pay

## 2019-10-22 ENCOUNTER — Inpatient Hospital Stay (HOSPITAL_BASED_OUTPATIENT_CLINIC_OR_DEPARTMENT_OTHER): Payer: BC Managed Care – PPO | Admitting: Hematology and Oncology

## 2019-10-22 ENCOUNTER — Ambulatory Visit
Admission: RE | Admit: 2019-10-22 | Discharge: 2019-10-22 | Disposition: A | Discharge status: Home / Self Care | Payer: BC Managed Care – PPO | Source: Ambulatory Visit | Attending: Hematology and Oncology | Admitting: Hematology and Oncology

## 2019-10-22 ENCOUNTER — Inpatient Hospital Stay
Admission: AD | Admit: 2019-10-22 | Discharge: 2019-10-28 | DRG: 444 | Disposition: A | Discharge status: Hospice | Payer: BC Managed Care – PPO | Source: Ambulatory Visit | Attending: Internal Medicine | Admitting: Internal Medicine

## 2019-10-22 ENCOUNTER — Inpatient Hospital Stay: Payer: BC Managed Care – PPO

## 2019-10-22 ENCOUNTER — Encounter: Payer: Self-pay | Admitting: Hematology and Oncology

## 2019-10-22 VITALS — BP 123/83 | HR 80 | Temp 97.6°F | Resp 20 | Ht 67.0 in | Wt 109.1 lb

## 2019-10-22 VITALS — BP 108/70 | HR 75 | Temp 97.5°F | Resp 18

## 2019-10-22 DIAGNOSIS — C220 Liver cell carcinoma: Secondary | ICD-10-CM | POA: Diagnosis present

## 2019-10-22 DIAGNOSIS — L299 Pruritus, unspecified: Secondary | ICD-10-CM | POA: Diagnosis present

## 2019-10-22 DIAGNOSIS — Z8673 Personal history of transient ischemic attack (TIA), and cerebral infarction without residual deficits: Secondary | ICD-10-CM | POA: Diagnosis not present

## 2019-10-22 DIAGNOSIS — K7290 Hepatic failure, unspecified without coma: Secondary | ICD-10-CM | POA: Diagnosis present

## 2019-10-22 DIAGNOSIS — Z515 Encounter for palliative care: Secondary | ICD-10-CM

## 2019-10-22 DIAGNOSIS — K831 Obstruction of bile duct: Secondary | ICD-10-CM

## 2019-10-22 DIAGNOSIS — M62838 Other muscle spasm: Secondary | ICD-10-CM | POA: Diagnosis present

## 2019-10-22 DIAGNOSIS — R7989 Other specified abnormal findings of blood chemistry: Secondary | ICD-10-CM

## 2019-10-22 DIAGNOSIS — C801 Malignant (primary) neoplasm, unspecified: Secondary | ICD-10-CM | POA: Diagnosis present

## 2019-10-22 DIAGNOSIS — F17210 Nicotine dependence, cigarettes, uncomplicated: Secondary | ICD-10-CM | POA: Diagnosis present

## 2019-10-22 DIAGNOSIS — I639 Cerebral infarction, unspecified: Secondary | ICD-10-CM

## 2019-10-22 DIAGNOSIS — Z7982 Long term (current) use of aspirin: Secondary | ICD-10-CM

## 2019-10-22 DIAGNOSIS — I81 Portal vein thrombosis: Secondary | ICD-10-CM | POA: Diagnosis present

## 2019-10-22 DIAGNOSIS — E876 Hypokalemia: Secondary | ICD-10-CM

## 2019-10-22 DIAGNOSIS — R17 Unspecified jaundice: Secondary | ICD-10-CM | POA: Insufficient documentation

## 2019-10-22 DIAGNOSIS — Z66 Do not resuscitate: Secondary | ICD-10-CM | POA: Diagnosis not present

## 2019-10-22 DIAGNOSIS — G629 Polyneuropathy, unspecified: Secondary | ICD-10-CM | POA: Diagnosis present

## 2019-10-22 DIAGNOSIS — Z681 Body mass index (BMI) 19 or less, adult: Secondary | ICD-10-CM | POA: Diagnosis not present

## 2019-10-22 DIAGNOSIS — D649 Anemia, unspecified: Secondary | ICD-10-CM | POA: Diagnosis present

## 2019-10-22 DIAGNOSIS — K219 Gastro-esophageal reflux disease without esophagitis: Secondary | ICD-10-CM | POA: Diagnosis present

## 2019-10-22 DIAGNOSIS — E440 Moderate protein-calorie malnutrition: Secondary | ICD-10-CM | POA: Diagnosis present

## 2019-10-22 DIAGNOSIS — Z79899 Other long term (current) drug therapy: Secondary | ICD-10-CM

## 2019-10-22 DIAGNOSIS — M1990 Unspecified osteoarthritis, unspecified site: Secondary | ICD-10-CM | POA: Diagnosis present

## 2019-10-22 DIAGNOSIS — Z7902 Long term (current) use of antithrombotics/antiplatelets: Secondary | ICD-10-CM

## 2019-10-22 DIAGNOSIS — E871 Hypo-osmolality and hyponatremia: Secondary | ICD-10-CM | POA: Diagnosis present

## 2019-10-22 DIAGNOSIS — Z809 Family history of malignant neoplasm, unspecified: Secondary | ICD-10-CM | POA: Diagnosis not present

## 2019-10-22 DIAGNOSIS — Z8249 Family history of ischemic heart disease and other diseases of the circulatory system: Secondary | ICD-10-CM

## 2019-10-22 DIAGNOSIS — Z20822 Contact with and (suspected) exposure to covid-19: Secondary | ICD-10-CM | POA: Diagnosis present

## 2019-10-22 DIAGNOSIS — I10 Essential (primary) hypertension: Secondary | ICD-10-CM | POA: Diagnosis present

## 2019-10-22 DIAGNOSIS — K8020 Calculus of gallbladder without cholecystitis without obstruction: Secondary | ICD-10-CM

## 2019-10-22 LAB — COMPREHENSIVE METABOLIC PANEL
ALT: 218 U/L — ABNORMAL HIGH (ref 0–44)
AST: 221 U/L — ABNORMAL HIGH (ref 15–41)
Albumin: 3.4 g/dL — ABNORMAL LOW (ref 3.5–5.0)
Alkaline Phosphatase: 970 U/L — ABNORMAL HIGH (ref 38–126)
Anion gap: 11 (ref 5–15)
BUN: 18 mg/dL (ref 8–23)
CO2: 25 mmol/L (ref 22–32)
Calcium: 9.4 mg/dL (ref 8.9–10.3)
Chloride: 99 mmol/L (ref 98–111)
Creatinine, Ser: 0.68 mg/dL (ref 0.61–1.24)
GFR calc Af Amer: >60 mL/min (ref >60)
GFR calc non Af Amer: >60 mL/min (ref >60)
Glucose, Bld: 92 mg/dL (ref 70–99)
Potassium: 3 mmol/L — ABNORMAL LOW (ref 3.5–5.1)
Sodium: 135 mmol/L (ref 135–145)
Total Bilirubin: 11.3 mg/dL — ABNORMAL HIGH (ref 0.3–1.2)
Total Protein: 7.7 g/dL (ref 6.5–8.1)

## 2019-10-22 LAB — PROTIME-INR
INR: 1 (ref 0.8–1.2)
Prothrombin Time: 12.4 seconds (ref 11.4–15.2)

## 2019-10-22 LAB — FIBRINOGEN: Fibrinogen: 743 mg/dL — ABNORMAL HIGH (ref 210–475)

## 2019-10-22 LAB — APTT: aPTT: 32 seconds (ref 24–36)

## 2019-10-22 MED ORDER — SODIUM CHLORIDE 0.9% FLUSH: 3.0000 mL | INTRAVENOUS | Status: DC | PRN

## 2019-10-22 MED ORDER — SENNOSIDES-DOCUSATE SODIUM 8.6-50 MG PO TABS: 1.0000 | ORAL_TABLET | Freq: Every evening | ORAL | Status: DC | PRN

## 2019-10-22 MED ORDER — SODIUM CHLORIDE 0.9 % IV SOLN
250.0000 mL | INTRAVENOUS | Status: DC | PRN
  Administered 2019-10-22: 250 mL via INTRAVENOUS

## 2019-10-22 MED ORDER — SODIUM CHLORIDE 0.9 % IV SOLN
INTRAVENOUS | Status: DC
  Filled 2019-10-22 (×2): qty 500

## 2019-10-22 MED ORDER — SODIUM CHLORIDE 0.9% FLUSH
3.0000 mL | Freq: Two times a day (BID) | INTRAVENOUS | Status: DC
  Administered 2019-10-22 – 2019-10-28 (×11): 3 mL via INTRAVENOUS

## 2019-10-22 MED ORDER — TRAMADOL HCL 50 MG PO TABS
50.0000 mg | ORAL_TABLET | Freq: Three times a day (TID) | ORAL | Status: DC | PRN
  Administered 2019-10-22 – 2019-10-26 (×5): 50 mg via ORAL
  Filled 2019-10-22 (×5): qty 1

## 2019-10-22 MED ORDER — SODIUM CHLORIDE 0.9 % IV SOLN
Freq: Once | INTRAVENOUS | Status: AC
  Filled 2019-10-22: qty 250

## 2019-10-22 MED ORDER — GADOBUTROL 1 MMOL/ML IV SOLN
4.0000 mL | Freq: Once | INTRAVENOUS | Status: AC | PRN
  Administered 2019-10-22: 4 mL via INTRAVENOUS

## 2019-10-22 MED ORDER — HEPARIN SODIUM (PORCINE) 5000 UNIT/ML IJ SOLN
5000.0000 [IU] | Freq: Three times a day (TID) | INTRAMUSCULAR | Status: DC
  Administered 2019-10-22 – 2019-10-24 (×5): 5000 [IU] via SUBCUTANEOUS
  Filled 2019-10-22 (×5): qty 1

## 2019-10-22 MED ORDER — POTASSIUM CHLORIDE CRYS ER 20 MEQ PO TBCR
40.0000 meq | EXTENDED_RELEASE_TABLET | ORAL | Status: AC
  Administered 2019-10-22 (×2): 40 meq via ORAL
  Filled 2019-10-22 (×2): qty 2

## 2019-10-22 NOTE — Progress Notes (Signed)
Patient to be admitted this afternoon to inpatient at [facility]. Plan to give him 522ml of NS with 20 mEq of KCL over 2 hours via peripheral IV. RN will saline lock IV catheter prior to discharge from clinic. Patient very jaundiced today. Remains NPO for possible procedure. Daughter is transporting patient to [facility] to be direct admission. CMA and RN reinforced to daughter to keep patient NPO. CMA Loma Sousa reports patient room is 103.

## 2019-10-22 NOTE — H&P (Signed)
[facility] NAME: Chris Mcdonald    MR#:  [id number]  DATE OF BIRTH:  [birth year]  DATE OF ADMISSION:  10/22/2019  PRIMARY CARE PHYSICIAN: Donnie Coffin, MD   REQUESTING/REFERRING PHYSICIAN: Dr Mike Gip  Patient coming from :Dr corcoran's office   CHIEF COMPLAINT:   Worsening yellowing of the skin and worsening liver enzymes with elevated bilirubin of 11.3 HISTORY OF PRESENT ILLNESS:  Chris Mcdonald  is a 64 y.o. male with a known history of stage IIIB hepatocellular carcinoma on chemotherapy at the cancer center. Patient follows with Dr. Mike Gip.  Patient was seen at the cancer center today. He had labs done on 10/21/2019 revealed an AST 191, ALT 195, alkaline phosphatase 842, and 8.4.  He was scheduled for STAT RUQ ultrasound.   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 Flow in the portal vein was in the anatomic direction. The apparent periportal mass seen on recent MR was not distinguishable by ultrasound. There was central intrahepatic biliary duct dilatation in this region. There was no appreciable extrahepatic biliary duct dilatation. No gallbladder pathology was demonstrable on this study  Patient feels itchy on his head and arms. He has been breaking out with red spots. Denies any fever or sweat nausea vomiting abdominal pain. He has lost some weight cannot quantify.  10/22/2019-- bilirubin is 11.3.  Dr. Mike Gip had discussed with Dr. Doristine Counter.I. to see if patient needs ERCP to look for obstruction-- he said it may be difficult to do it.  Dr. Mike Gip spoke with IR Dr. Lewayne Bunting-- stat MRCP to be done and then determination will be made if patient needs percutaneous biliary drain.   PAST MEDICAL HISTORY:   Past Medical History:  Diagnosis Date  . Arthritis   . Hepatitis   . Hypertension   . Liver cancer ([place])   . Spinal stenosis   . Stroke [facility])      PAST SURGICAL HISTOIRY:   Past Surgical History:  Procedure Laterality Date  . COLONOSCOPY WITH PROPOFOL N/A 02/21/2015   Procedure: COLONOSCOPY WITH PROPOFOL;  Surgeon: Lollie Sails, MD;  Location[facility] ENDOSCOPY;  Service: Endoscopy;  Laterality: N/A;  . INGUINAL HERNIA REPAIR Bilateral   . IR RADIOLOGIST EVAL & MGMT  08/04/2018  . POSTERIOR CERVICAL VERTEBRAE EXCISION      SOCIAL HISTORY:   Social History   Tobacco Use  . Smoking status: Current Every Day Smoker    Packs/day: 1.50    Years: 50.00    Pack years: 75.00    Types: Cigarettes  . Smokeless tobacco: Never Used  Substance Use Topics  . Alcohol use: No    FAMILY HISTORY:   Family History  Problem Relation Age of Onset  . Cancer Brother   . Heart disease Brother   . Cancer Mother     DRUG ALLERGIES:  No Known Allergies  REVIEW OF SYSTEMS:  Review of Systems  Constitutional: Negative for chills, fever and weight loss.  HENT: Negative for ear discharge, ear pain and nosebleeds.   Eyes: Negative for blurred vision, pain and discharge.  Respiratory: Negative for sputum production, shortness of breath, wheezing and stridor.   Cardiovascular: Negative for chest pain, palpitations, orthopnea and PND.  Gastrointestinal: Negative for abdominal pain, diarrhea, nausea and vomiting.  Genitourinary: Negative for frequency and urgency.  Musculoskeletal: Negative for back pain and joint pain.  Skin: Positive for itching and rash.  Yellow skin  Neurological: Negative for sensory change, speech change, focal weakness and weakness.  Psychiatric/Behavioral: Negative for depression and hallucinations. The patient is not nervous/anxious.      MEDICATIONS AT HOME:   Prior to Admission medications   Medication Sig Start Date End Date Taking? Authorizing Provider  aspirin EC 81 MG tablet Take 81 mg by mouth daily.     [provider]  atorvastatin (LIPITOR) 40 MG tablet Take 1 tablet (40 mg total)  by mouth daily at 6 PM. 08/08/19   Leslye Peer, Richard, MD  cabozantinib (CABOMETYX) 20 MG tablet Take 2 tablets (40 mg total) by mouth daily. Take on an empty stomach, 1 hour before or 2 hours after meals. 09/01/19   Lequita Asal, MD  clopidogrel (PLAVIX) 75 MG tablet Take 1 tablet (75 mg total) by mouth daily. 08/08/19   Loletha Grayer, MD  fluticasone (FLONASE) 50 MCG/ACT nasal spray 1 spray by Each Nare route daily. 03/27/18   [provider]  gabapentin (NEURONTIN) 100 MG capsule Take 1 capsule (100 mg total) by mouth 2 (two) times daily. 08/08/19   Loletha Grayer, MD  hydrochlorothiazide (HYDRODIURIL) 12.5 MG tablet Take 25 mg by mouth daily.     [provider]  omeprazole (PRILOSEC) 20 MG capsule TAKE 1 CAPSULE BY MOUTH EVERY DAY Patient not taking: Reported on 10/07/2019 07/26/19   Lequita Asal, MD  ondansetron (ZOFRAN) 8 MG tablet Take 1 tablet (8 mg total) by mouth every 8 (eight) hours as needed for nausea or vomiting. 10/14/19   Lequita Asal, MD  potassium chloride SA (KLOR-CON M20) 20 MEQ tablet TAKE 1 TABLET (20 MEQ TOTAL) BY MOUTH DAILY. AS DIRECTED BY MD 10/14/19   Lequita Asal, MD      VITAL SIGNS:  Blood pressure (!) 111/92, pulse 79, temperature 98.1 F (36.7 C), temperature source Oral, resp. rate 16, SpO2 100 %.  PHYSICAL EXAMINATION:  GENERAL:  64 y.o.-year-old patient lying in the bed with no acute distress. Thin cachectic EYES: Pupils equal, round, reactive to light and accommodation. ++ scleral icterus.  HEENT: Head atraumatic, normocephalic. Oropharynx and nasopharynx clear.  NECK:  Supple, no jugular venous distention. No thyroid enlargement, no tenderness.  LUNGS: Normal breath sounds bilaterally, no wheezing, rales,rhonchi or crepitation. No use of accessory muscles of respiration.  CARDIOVASCULAR: S1, S2 normal. No murmurs, rubs, or gallops.  ABDOMEN: Soft, nontender, nondistended. Bowel sounds present. No organomegaly or  mass.  EXTREMITIES: No pedal edema, cyanosis, or clubbing.  NEUROLOGIC: Cranial nerves II through XII are intact. Muscle strength 5/5 in all extremities. Sensation intact. Gait not checked.  PSYCHIATRIC: The patient is alert and oriented x 3.  SKIN: patient has petechial rash over upper extremity and his chest. Jaundice present  LABORATORY PANEL:   CBC No results for input(s): WBC, HGB, HCT, PLT in the last 168 hours. ------------------------------------------------------------------------------------------------------------------  Chemistries  Recent Labs  Lab 10/22/19 1126  NA 135  K 3.0*  CL 99  CO2 25  GLUCOSE 92  BUN 18  CREATININE 0.68  CALCIUM 9.4  AST 221*  ALT 218*  ALKPHOS 970*  BILITOT 11.3*   ------------------------------------------------------------------------------------------------------------------  Cardiac Enzymes No results for input(s): TROPONINI in the last 168 hours. ------------------------------------------------------------------------------------------------------------------  RADIOLOGY:  US ABDOMEN LIMITED RUQ  Result Date: 10/22/2019 CLINICAL DATA:  Elevated liver enzymes EXAM: ULTRASOUND ABDOMEN LIMITED RIGHT UPPER QUADRANT COMPARISON:  Abdominal MR October 07 2019 FINDINGS: Gallbladder: No gallstones or wall thickening visualized. No evident pericholecystic fluid. No  sonographic Percell Miller sign noted by sonographer. Common bile duct: Diameter: 2 mm. No extrahepatic biliary duct dilatation is evident. Central biliary duct prominence in the periportal region appear similar to MR. Liver: No focal lesion identified. Within normal limits in parenchymal echogenicity. Thrombus is seen within the portal vein, incompletely obstructing. Flow in the portal vein is in the anatomic direction. Other: None. IMPRESSION: 1. 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 Flow in the portal vein is in the anatomic direction. 2. The  apparent periportal mass seen on recent MR is not distinguishable by ultrasound. Note that there is central intrahepatic biliary duct dilatation in this region. There is no appreciable extrahepatic biliary duct dilatation. 3.  No gallbladder pathology demonstrable on this study. Electronically Signed   By: Lowella Grip III M.D.   On: 10/22/2019 10:27    EKG:    IMPRESSION AND PLAN:   Kano Heckmann  is a 64 y.o. male with a known history of stage IIIB hepatocellular carcinoma on chemotherapy at the cancer center. Patient follows with Dr. Mike Gip. Patient was seen at the cancer center today. He had labs done on 10/21/2019 revealed an AST 191, ALT 195, alkaline phosphatase 842, and 8.4.  He was scheduled for STAT RUQ ultrasound.  Obstructive jaundice/hyperbilirubinemia 11.3 with history of hepatocellular carcinoma -admit to MedSurg -stat MRCP -will discussed with interventional radiology after MRCP to see if patient can have percutaneous drain. Message sent to Dr. Virgina Norfolk of pt being admitted -hold aspirin Plavix -oncology consultation  Hypertension -resume home meds which is hydrochlorothiazide  Hypokalemia pharmacy to replete with oral potassium  abnormal LFTs secondary to above  Malnourishment secondary to hepatocellular cancer -dietitian to evaluate  Jerrye Bushy continue omeprazole    Family Communication : daughter in the room Consults : IR, oncology Code Status : full code discussed with patient DVT prophylaxis : heparin TOTAL TIME TAKING CARE OF THIS PATIENT: *50* minutes.    Fritzi Mandes M.D  [facility]     CC: Primary care physician; Donnie Coffin, MD

## 2019-10-22 NOTE — Progress Notes (Signed)
[facility]  [address] [place], [place] [address] Phone: [phone]  Fax: [phone]   Clinic Day:  10/22/2019  Referring physician: Donnie Coffin, MD  Chief Complaint: Chris Mcdonald is a 64 y.o. male with stage IIIBhepatocellular carcinoma who is seen for assessment on day 9 of cycle #1 nivolumab.  HPI: The patient was last seen in the medical oncology clinic on 10/14/2019. At that time, he felt "good".  His liver function tests were increasing.  AST was 149, ALT 214, alkaline phosphatase 612, and bilirubin 2.7.  Imaging studies were reviewed.  He was not amendable to a procedure to relive obstruction. We discussed limited treatment.  He received cycle #1 nivolumab  Labs on 10/21/2019 revealed an AST 191, ALT 195, alkaline phosphatase 842, and 8.4.  He was scheduled for STAT RUQ ultrasound.  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 Flow in the portal vein was in the anatomic direction. The apparent periportal mass seen on recent MR was not distinguishable by ultrasound. There was central intrahepatic biliary duct dilatation in this region. There was no appreciable extrahepatic biliary duct dilatation. No gallbladder pathology was demonstrable on this study.  During the interim, he has felt "good." He reports that he got very itchy on his head and arms 2 days after treatment. He has been breaking out with red spots. The spots do not bother him, they just "come and go".   He denies fevers, sweats, nausea, vomiting, diarrhea, vision changes, mouth sores, tenderness, shortness of breath, and nose bleeds. He gets headaches every once in a while. His stools are normal appearing. His urine has turned dark brown.   He is eating well. He takes potassium 20 meq BID.   Past Medical History:  Diagnosis Date  . Arthritis   . Hepatitis   . Hypertension   . Liver cancer ([place])   . Spinal  stenosis   . Stroke [facility])     Past Surgical History:  Procedure Laterality Date  . COLONOSCOPY WITH PROPOFOL N/A 02/21/2015   Procedure: COLONOSCOPY WITH PROPOFOL;  Surgeon: Lollie Sails, MD;  Location: [facility] ENDOSCOPY;  Service: Endoscopy;  Laterality: N/A;  . INGUINAL HERNIA REPAIR Bilateral   . IR RADIOLOGIST EVAL & MGMT  08/04/2018  . POSTERIOR CERVICAL VERTEBRAE EXCISION      Family History  Problem Relation Age of Onset  . Cancer Brother   . Heart disease Brother   . Cancer Mother     Social History:  reports that he has been smoking cigarettes. He has a 75.00 pack-year smoking history. He has never used smokeless tobacco. He reports that he does not drink alcohol and does not use drugs.  [name] in [place] with his wife and daughter.He has a 9 year old daughter, 72 year old son, and 84 year old son. The patient is alone today.  Allergies: No Known Allergies  Current Medications: Current Outpatient Medications  Medication Sig Dispense Refill  . aspirin EC 81 MG tablet Take 81 mg by mouth daily.     Marland Kitchen atorvastatin (LIPITOR) 40 MG tablet Take 1 tablet (40 mg total) by mouth daily at 6 PM. 30 tablet 0  . clopidogrel (PLAVIX) 75 MG tablet Take 1 tablet (75 mg total) by mouth daily. 30 tablet 0  . fluticasone (FLONASE) 50 MCG/ACT nasal spray 1 spray by Each Nare route daily.    Marland Kitchen gabapentin (NEURONTIN) 100 MG capsule Take 1  capsule (100 mg total) by mouth 2 (two) times daily. 60 capsule 0  . hydrochlorothiazide (HYDRODIURIL) 12.5 MG tablet Take 25 mg by mouth daily.     . ondansetron (ZOFRAN) 8 MG tablet Take 1 tablet (8 mg total) by mouth every 8 (eight) hours as needed for nausea or vomiting. 20 tablet 1  . potassium chloride SA (KLOR-CON M20) 20 MEQ tablet TAKE 1 TABLET (20 MEQ TOTAL) BY MOUTH DAILY. AS DIRECTED BY MD 40 tablet 0  . cabozantinib (CABOMETYX) 20 MG tablet Take 2 tablets (40 mg total) by mouth daily. Take on an empty stomach, 1 hour before or 2 hours after  meals. 60 tablet 0  . mupirocin ointment (BACTROBAN) 2 % Apply twice a day to umbilical area (Patient not taking: Reported on 10/13/2019) 22 g 0  . omeprazole (PRILOSEC) 20 MG capsule TAKE 1 CAPSULE BY MOUTH EVERY DAY (Patient not taking: Reported on 10/07/2019) 90 capsule 1   No current facility-administered medications for this visit.    Review of Systems  Constitutional: Positive for weight loss (2 lbs). Negative for chills, diaphoresis, fever and malaise/fatigue.       Feels "good."  HENT: Negative.  Negative for congestion, ear discharge, ear pain, hearing loss, nosebleeds, sinus pain, sore throat and tinnitus.   Eyes: Negative for blurred vision and double vision.  Respiratory: Negative.  Negative for cough, hemoptysis, sputum production and shortness of breath.   Cardiovascular: Negative.  Negative for chest pain, palpitations and leg swelling.  Gastrointestinal: Negative.  Negative for abdominal pain, blood in stool, constipation, diarrhea, heartburn, melena, nausea and vomiting.       Eating well.  Hungry as NPO this morning.  Genitourinary: Negative.  Negative for dysuria, frequency, hematuria and urgency.       Urine starts yellow then turns brown  Musculoskeletal: Negative for back pain (chronic), joint pain, myalgias and neck pain.  Skin: Positive for itching (head and arms). Negative for rash.       Red spots that come and go on chest and arms.  Neurological: Positive for headaches (occasional). Negative for dizziness, tingling, sensory change and weakness.  Endo/Heme/Allergies: Negative.  Does not bruise/bleed easily.  Psychiatric/Behavioral: Negative.  Negative for depression and memory loss. The patient is not nervous/anxious and does not have insomnia.   All other systems reviewed and are negative.  Performance status (ECOG): 1  Vitals Blood pressure 123/83, pulse 80, temperature 97.6 F (36.4 C), resp. rate 20, height 5\' 7"  (1.702 m), weight 109 lb 2 oz (49.5 kg), SpO2  100 %.   Physical Exam Vitals and nursing note reviewed.  Constitutional:      General: He is not in acute distress.    Appearance: Normal appearance. He is not diaphoretic.     Interventions: Face mask in place.  HENT:     Head: Normocephalic and atraumatic.     Comments: Short brown hair. Gray beard. Edentulous. Temporal wasting. Mask.    Mouth/Throat:     Mouth: Mucous membranes are moist.     Pharynx: Oropharynx is clear.  Eyes:     General: Scleral icterus present.     Extraocular Movements: Extraocular movements intact.     Conjunctiva/sclera: Conjunctivae normal.     Pupils: Pupils are equal, round, and reactive to light.     Comments: Blue eyes.  Cardiovascular:     Rate and Rhythm: Normal rate and regular rhythm.     Pulses: Normal pulses.     Heart sounds: Normal  heart sounds. No murmur heard.   Pulmonary:     Effort: Pulmonary effort is normal. No respiratory distress.     Breath sounds: Normal breath sounds. No wheezing or rales.  Chest:     Chest wall: No tenderness.  Abdominal:     General: Bowel sounds are normal. There is no distension.     Palpations: Abdomen is soft. There is no mass.     Tenderness: There is no abdominal tenderness. There is no guarding or rebound.  Musculoskeletal:        General: No swelling or tenderness. Normal range of motion.     Cervical back: Normal range of motion and neck supple.  Lymphadenopathy:     Head:     Right side of head: No preauricular, posterior auricular or occipital adenopathy.     Left side of head: No preauricular, posterior auricular or occipital adenopathy.     Cervical: No cervical adenopathy.     Upper Body:     Right upper body: No supraclavicular or axillary adenopathy.     Left upper body: No supraclavicular or axillary adenopathy.     Lower Body: No right inguinal adenopathy. No left inguinal adenopathy.  Skin:    General: Skin is warm and dry.     Coloration: Skin is jaundiced.     Comments: Spider  angiomas. Small areas of subcutaneous bleeding, seems to correlate with where he scratches.  Neurological:     Mental Status: He is alert and oriented to person, place, and time. Mental status is at baseline.  Psychiatric:        Mood and Affect: Mood normal.        Behavior: Behavior normal.        Thought Content: Thought content normal.        Judgment: Judgment normal.    Imaging studies: 06/19/2019:Low dose chest CTrevealed Lung-RADS 4A-S, suspicious. There was a new centrilobular nodularity in both lungs, more likely inflammatory. Three was a 8.5 x 5.7 cm poorly marginated geographic region of low attenuation in the superior right liver, new since 06/17/2017.  09/16/2018:Chest CTrevealed aLung-RADSscore of2, benign appearance or behavior. There was an ill-defined liver mass felt to represent a hepatoma on 07/10/2018, in the setting of cirrhosis. 07/10/2018:Abdomen MRI revealed an 8.8 cm heterogeneous infiltrating segment 8. Findings were consistent with a hepatoma, given the underlying cirrhotic changes involving the liver. This was also invading and obstructing the middle and right portal veins and the middle hepatic vein. There were scattered abdominal lymph nodes but no definite findings for metastatic disease. 11/28/2018:Abdominal MRI revealedresponse to therapy of segment 8 hepatocellular carcinoma with persistent tumor thrombus extension into the right portal vein. There was no evidence of new liver lesions or abdominal metastasis. 03/15/2019:Abdominal MRIrevealed no significant change in poorly defined mass in segment 8 of the right lobe, with persistent tumor thrombus throughout the right portal vein. There was no new liver lesions or abdominal metastatic disease. 06/15/2019:Abdominal MRIrevealedslight increase in peripheral biliary ductal dilation surrounding the infiltrative central hepatic neoplasm. Portal vein involvement may be slightly improved  compared to the prior study. There was no significant change in size or new lesion.There was suggestion of subcarinal adenopathy though assessment of the chest on MRIwas limited. Consider chest CT for further evaluation. There was question of colonic thickening,whichcould herald mild colitis. Correlate clinically. 08/26/2019:  Abdominal MRI revealed a branching, irregular hypoenhancing mass of the right lobe of the liver with associated atrophy of the right lobe, biliary ductal  dilatation and right portal vein invasion/occlusion. The bulk and configuration of the mass was best appreciated on coronal images, with the largest components located very centrally and in the subcapsular dome of the right lobe.  The mass was not significantly changed. There was diffuse, somewhat heterogeneous arterial hyperenhancement of the right lobe of the liver, likely due to perfusion anomaly given occlusion of the right portal vein. There were multiple subcentimeter arterially enhancing foci throughout the left lobe of liver, without evidence of discrete mass, washout, or capsule.  10/07/2019: AbdominalMRIrevealed question minimal increase in distension of LEFT intrahepatic and LEFT hepatic biliary ducts(minimal). Therewasoverall very little interval change in the appearance of the mass, portal vein invasion and RIGHT intrahepatic biliary ductal dilation. There was expansile portal venous thrombus which accountedfor much of the mass like area extends into the main portal vein and central portion of the LEFT portal vein without occluding the structures, unchanged. There was no abdominal adenopathy. 10/22/2019:  RUQ ultrasound revealed an incompletely obstructing thrombus (bland and tumor) in the portal vein. Flow in the portal vein was in the anatomic direction. The apparent periportal mass seen on recent MR was not distinguishable by ultrasound. There was central intrahepatic biliary duct dilatation in this region.  There was no appreciable extrahepatic biliary duct dilatation. No gallbladder pathology was demonstrable on this study.   Appointment on 10/21/2019  Component Date Value Ref Range Status  . Sodium 10/21/2019 133* 135 - 145 mmol/L Final  . Potassium 10/21/2019 3.1* 3.5 - 5.1 mmol/L Final  . Chloride 10/21/2019 100  98 - 111 mmol/L Final  . CO2 10/21/2019 23  22 - 32 mmol/L Final  . Glucose, Bld 10/21/2019 115* 70 - 99 mg/dL Final   Glucose reference range applies only to samples taken after fasting for at least 8 hours.  . BUN 10/21/2019 20  8 - 23 mg/dL Final  . Creatinine, Ser 10/21/2019 0.64  0.61 - 1.24 mg/dL Final  . Calcium 10/21/2019 9.0  8.9 - 10.3 mg/dL Final  . Total Protein 10/21/2019 7.5  6.5 - 8.1 g/dL Final  . Albumin 10/21/2019 3.4* 3.5 - 5.0 g/dL Final  . AST 10/21/2019 191* 15 - 41 U/L Final  . ALT 10/21/2019 195* 0 - 44 U/L Final  . Alkaline Phosphatase 10/21/2019 842* 38 - 126 U/L Final  . Total Bilirubin 10/21/2019 8.4* 0.3 - 1.2 mg/dL Final  . GFR calc non Af Amer 10/21/2019 >60  >60 mL/min Final  . GFR calc Af Amer 10/21/2019 >60  >60 mL/min Final  . Anion gap 10/21/2019 10  5 - 15 Final   Performed at [facility], [address], [place], [place] [address]    Assessment:  Chris Mcdonald is a 64 y.o. male with unresectable hepatocellular carcinoma. He has a history of hepatitis C(treated with Harvoni in 04/11/2015). He has hepatitis B. Mass was discovered incidentally on low dose chest CT screening program. AFPwas 7329. Child-Pugh ClassisA (score 5).  Low dose chest CTon 06/18/2018 revealed Lung-RADS 4A-S, suspicious. There was a new centrilobular nodularity in both lungs, more likely inflammatory. Three was a 8.5 x 5.7 cm poorly marginated geographic region of low attenuation in the superior right liver, new since 06/17/2017. Chest CTon 09/16/2018 revealed aLung-RADSscore of2, benign appearance or behavior. There was an ill-defined  liver mass felt to represent a hepatoma on 07/10/2018, in the setting of cirrhosis.  Abdomen MRI on 07/10/2018 revealed an 8.8 cm heterogeneous infiltrating segment 8. Findings were consistent with a hepatoma,  given the underlying cirrhotic changes involving the liver. This was also invading and obstructing the middle and right portal veins and the middle hepatic vein. There were scattered abdominal lymph nodes but no definite findings for metastatic disease.  AFPhas been followed: 9024 on 07/27/2018,6250 on 08/17/2018, 467 on 11/18/2018, 271 on 12/29/2018, 2320 on 02/08/2019, 1548 on 03/08/2019, 721 on 04/07/2019, 500 on 05/05/2019, 501.0 on 06/14/2019, 642 on 07/08/2019, 603 on 07/15/2019, and 612 on 08/12/2019.  He began lenvatinibon 08/25/2018.Hewasoff lenvatinibfrom09/09/2018 - 02/14/2019.Lenvatinib was decreased to 4 mg a day on 06/17/2019.  He discontinued lenvatinib on 08/22/2019 secondary to intolerance.  He began cabozantinib on 40 mg/dayon05/17/2021. He experienced nausea and abdominal discomfort after 6 days.  Dose was decreased to 20 mg/day on 09/20/2019.  He discontinued cabozantinib on 09/30/2019 secondary to GI intolerance.  Abdominal MRI on 08/26/2019 revealed a branching, irregular hypoenhancing mass of the right lobe of the liver with associated atrophy of the right lobe, biliary ductal dilatation and right portal vein invasion/occlusion. The mass was not significantly changed. There was diffuse, somewhat heterogeneous arterial hyperenhancement of the right lobe of the liver, likely due to perfusion anomaly. There were multiple subcentimeter arterially enhancing foci throughout the left lobe of liver, without evidence of discrete mass, washout, or capsule.   Abdominal MRI on 10/07/2019 revealed question minimal increase in distension of LEFT intrahepatic and LEFT hepatic biliary ducts (minimal).  There was overall very little interval change in the appearance of the  mass, portal vein invasion and RIGHT intrahepatic biliary ductal dilation.  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  There was no abdominal adenopathy.  He has elevated LFTs.  Hepatitis B core antibody was positive.  Hepatitis B surface antigen, hepatitis B E antigen, and hepatitis B DNA were negative.  Hepatitis C antibody was positive. Hepatitis C RNA was negative.  He has a history of hypercalcemia. Calcium was 11.0 on 08/17/2018. PTH and PTH-rp were low on 08/19/2018.Calcium was 9.3 on 11/30/2018.  EKGon 06/17/2019 revealed no increased QTc.  Symptomatically, he has pruritus associated with a rapidly rising bilirubin.  He denies any nausea or vomiting.  Exam reveals marked jaundice, scleral icterus, and areas of SQ bleeding s/p scratching.  Bilirubin is 11.3.  PT and PTT are normal.  Plan: 1.   Labs today:  CMP, PT/INR, PTT, fibrinogen. 2. Hepatocellular carcinoma He was diagnosed with unresectable hepatocellular carcinoma in 05/2018.             He stopped lenvatinib on 08/22/2019 secondary to poor tolerance. Abdominal MRIon 08/26/2019 revealed a stable mass. He remains Child Pugh class A (score 5). He began cabozantinib on 09/06/2019. He was unable to tolerate cabozantinib 40 mg/dayas well as 20 mg/day. He stopped cabozantinibon 06/09/2021secondary to nausea and vomiting. Abdominal MRI on 10/07/2019 revealed no progression of disease. Portal vein occlusion has not progressed.             He is day 9 s/p cycle #1 nivolumab.   LFTs have increased.  Changes predominantly associated with biliary duct obstruction.   Suspect etiology is due to underlying hepatocellular  carcinoma  Discuss plan to admit to the hospital for STAT MRCP.   Spoke with Dr Allen Norris and interventional radiology. 3. Increasing LFTs             09/16/2019: AST 108. ALT 123. Alkaline phosphatase  349. Bilirubin 0.9. 09/27/2019: AST 148. ALT 224. Alkaline phosphatase 539. Bilirubin 1.0. 10/07/2019:AST 150.ALT 206. Alkaline phosphatase 485. Bilirubin 1.2. 10/08/2019: AST 160. ALT 211. Alkaline phosphatase 492. Bilirubin 2.0.             10/14/2019: AST 149.  ALT 214.  Alkaline phosphatase 612.  Bilirubin 2.7.  10/21/2019: AST 191.  [id number].   Alkaline phosphatase 842.  Bilirubin 8.4.  10/22/2019: AST 221.  ALT 218.  Alkaline phosphatase 970.  Bilirubin 11.3. Imaging last week revealed intrahepatic disease progression and no progression of thrombus (blandversus tumor).             RUQ ultrasound does not provide enough information to determine if patient eligible for ERCP or percutaneous biliary drain.   Spoke with interventional radiology.   Admit to hospital with STAT MRCP.  Liver enzymes minimally changed and thus no indication of hepatitis associated with nivolumab or hepatitis B reactivation.  Hepatitis B and C serologies as well as viral load were negative.             Follow LFTs closely.  4.   Hypokalemia             Potassium 3.0 today.  Patient has been taking potassium 20 meq po BID.  Potassium chloride 20 meq IV today in clinic while waiting for admission and imaging.. 5.   Admit to [facility].  Hospitalist contacted.   I discussed the assessment and treatment plan with the patient.  The patient was provided an opportunity to ask questions and all were answered.  The patient agreed with the plan and demonstrated an understanding of the instructions.  The patient was advised to call back if the symptoms worsen or if the condition fails to improve as anticipated.  I provided 15 minutes of face-to-face time  during this encounter and > 50% was spent counseling as documented under my assessment and plan. An additional 20 minutes were spent reviewing his chart (Epic and Care Everywhere) including notes, labs, and imaging studies.  I spoke with Dr Allen Norris as well as interventional radiology.  I spoke with the hospitalist.   Lequita Asal, MD, PhD    10/22/2019, 11:00 AM  I, Mirian Mo Tufford, am acting as Education administrator for [employer]. Mike Gip, MD, PhD.  I, Tony Granquist C. Mike Gip, MD, have reviewed the above documentation for accuracy and completeness, and I agree with the above.

## 2019-10-22 NOTE — Consult Note (Signed)
PHARMACY CONSULT NOTE - FOLLOW UP  Pharmacy Consult for Electrolyte Monitoring and Replacement   Recent Labs: Potassium (mmol/L)  Date Value  10/22/2019 3.0 (L)   Magnesium (mg/dL)  Date Value  09/13/2019 2.1   Calcium (mg/dL)  Date Value  10/22/2019 9.4   Albumin (g/dL)  Date Value  10/22/2019 3.4 (L)   Phosphorus (mg/dL)  Date Value  09/13/2019 3.5   Sodium (mmol/L)  Date Value  10/22/2019 135     Assessment: Pharmacy consulted to replace electrolytes. 64 y.o. male with a known history of stage IIIB hepatocellular carcinoma on chemotherapy at the cancer center. He had labs done on 10/21/2019 revealed anAST 191,ALT 195, alkaline phosphatase 842, and8.4.  Goal of Therapy:  WNL  Plan:  Will give KCl 40 mEq x 2 PO.   F/u with AM labs.   Oswald Hillock ,PharmD Clinical Pharmacist 10/22/2019 5:40 PM

## 2019-10-22 NOTE — Telephone Encounter (Signed)
Called report IMPRESSION: 1. 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 Flow in the portal vein is in the anatomic direction.  2. The apparent periportal mass seen on recent MR is not distinguishable by ultrasound. Note that there is central intrahepatic biliary duct dilatation in this region. There is no appreciable extrahepatic biliary duct dilatation.  3.  No gallbladder pathology demonstrable on this study.   Electronically Signed   By: Lowella Grip III M.D.   On: 10/22/2019 10:27

## 2019-10-22 NOTE — Plan of Care (Signed)

## 2019-10-23 DIAGNOSIS — C801 Malignant (primary) neoplasm, unspecified: Secondary | ICD-10-CM | POA: Diagnosis present

## 2019-10-23 DIAGNOSIS — R7989 Other specified abnormal findings of blood chemistry: Secondary | ICD-10-CM

## 2019-10-23 DIAGNOSIS — K831 Obstruction of bile duct: Secondary | ICD-10-CM | POA: Diagnosis present

## 2019-10-23 LAB — HEPATIC FUNCTION PANEL
ALT: 178 U/L — ABNORMAL HIGH (ref 0–44)
AST: 188 U/L — ABNORMAL HIGH (ref 15–41)
Albumin: 2.8 g/dL — ABNORMAL LOW (ref 3.5–5.0)
Alkaline Phosphatase: 794 U/L — ABNORMAL HIGH (ref 38–126)
Bilirubin, Direct: 8.8 mg/dL — ABNORMAL HIGH (ref 0.0–0.2)
Indirect Bilirubin: 4.1 mg/dL — ABNORMAL HIGH (ref 0.3–0.9)
Total Bilirubin: 12.9 mg/dL — ABNORMAL HIGH (ref 0.3–1.2)
Total Protein: 6.4 g/dL — ABNORMAL LOW (ref 6.5–8.1)

## 2019-10-23 LAB — BASIC METABOLIC PANEL
Anion gap: 7 (ref 5–15)
BUN: 18 mg/dL (ref 8–23)
CO2: 25 mmol/L (ref 22–32)
Calcium: 9.1 mg/dL (ref 8.9–10.3)
Chloride: 102 mmol/L (ref 98–111)
Creatinine, Ser: 0.61 mg/dL (ref 0.61–1.24)
GFR calc Af Amer: >60 mL/min (ref >60)
GFR calc non Af Amer: >60 mL/min (ref >60)
Glucose, Bld: 93 mg/dL (ref 70–99)
Potassium: 3.8 mmol/L (ref 3.5–5.1)
Sodium: 134 mmol/L — ABNORMAL LOW (ref 135–145)

## 2019-10-23 MED ORDER — IBUPROFEN 400 MG PO TABS
400.0000 mg | ORAL_TABLET | Freq: Four times a day (QID) | ORAL | Status: DC | PRN
  Administered 2019-10-24: 400 mg via ORAL
  Filled 2019-10-23: qty 1

## 2019-10-23 MED ORDER — ONDANSETRON HCL 4 MG/2ML IJ SOLN: 4.0000 mg | Freq: Four times a day (QID) | INTRAMUSCULAR | Status: DC | PRN

## 2019-10-23 MED ORDER — KATE FARMS STANDARD 1.4 PO LIQD
325.0000 mL | Freq: Two times a day (BID) | ORAL | Status: DC
  Administered 2019-10-23 (×2): 325 mL via ORAL
  Administered 2019-10-24: 237 mL via ORAL
  Administered 2019-10-24 – 2019-10-28 (×5): 325 mL via ORAL
  Filled 2019-10-23 (×12): qty 325

## 2019-10-23 MED ORDER — ONDANSETRON HCL 4 MG/2ML IJ SOLN
4.0000 mg | Freq: Four times a day (QID) | INTRAMUSCULAR | Status: DC | PRN
  Administered 2019-10-23 – 2019-10-25 (×2): 4 mg via INTRAVENOUS
  Filled 2019-10-23 (×2): qty 2

## 2019-10-23 MED ORDER — DIPHENHYDRAMINE HCL 25 MG PO CAPS
25.0000 mg | ORAL_CAPSULE | Freq: Four times a day (QID) | ORAL | Status: DC | PRN
  Administered 2019-10-27 – 2019-10-28 (×2): 25 mg via ORAL
  Filled 2019-10-23 (×2): qty 1

## 2019-10-23 MED ORDER — ADULT MULTIVITAMIN W/MINERALS CH
1.0000 | ORAL_TABLET | Freq: Every day | ORAL | Status: DC
  Administered 2019-10-23 – 2019-10-28 (×5): 1 via ORAL
  Filled 2019-10-23 (×6): qty 1

## 2019-10-23 MED ORDER — METHOCARBAMOL 500 MG PO TABS
1000.0000 mg | ORAL_TABLET | Freq: Four times a day (QID) | ORAL | Status: DC | PRN
  Filled 2019-10-23: qty 2

## 2019-10-23 MED ORDER — BOOST / RESOURCE BREEZE PO LIQD CUSTOM
1.0000 | ORAL | Status: DC
  Administered 2019-10-23: 14:00:00 1 via ORAL
  Administered 2019-10-24: 330 mL via ORAL
  Administered 2019-10-25: 15:00:00 1 via ORAL

## 2019-10-23 MED ORDER — GABAPENTIN 100 MG PO CAPS
100.0000 mg | ORAL_CAPSULE | Freq: Two times a day (BID) | ORAL | Status: DC
  Administered 2019-10-23 – 2019-10-28 (×10): 100 mg via ORAL
  Filled 2019-10-23 (×10): qty 1

## 2019-10-23 NOTE — Evaluation (Signed)
Physical Therapy Evaluation Patient Details Name: Chris Mcdonald MRN: [id number] DOB: 10-02-1955 Today's Date: 10/23/2019   History of Present Illness  Pt is a 64 y.o. male with a known history of stage IIIB hepatocellular carcinoma on chemotherapy at the cancer center, admitted 10/22/19 for worsening LFTs and jaundice.    Clinical Impression  Patient alert, agreeable to PT evaluation. Pt reported at home he is independent, has family to assist as needed, denied falls.  Patient performed bed mobility, transfers, and ambulation independently (shoes donned). Pt did pause in standing to make sure he did not fee light headed prior to mobility. The patient demonstrated and reported return to baseline level of functioning, no further acute PT needs indicated. PT to sign off. Please reconsult PT if pt status changes or acute needs are identified.      Follow Up Recommendations No PT follow up    Equipment Recommendations       Recommendations for Other Services       Precautions / Restrictions Precautions Precautions: None Restrictions Weight Bearing Restrictions: No      Mobility  Bed Mobility Overal bed mobility: Independent                Transfers Overall transfer level: Independent                  Ambulation/Gait                Stairs            Wheelchair Mobility    Modified Rankin (Stroke Patients Only)       Balance Overall balance assessment: Needs assistance Sitting-balance support: Feet supported Sitting balance-Leahy Scale: Normal       Standing balance-Leahy Scale: Normal                               Pertinent Vitals/Pain Pain Assessment: No/denies pain    Home Living Family/patient expects to be discharged to:: Private residence Living Arrangements: Spouse/significant other;Children Available Help at Discharge: Family;Available 24 hours/day Type of Home: House Home Access: Stairs to enter Entrance  Stairs-Rails: Can reach both;Right;Left Entrance Stairs-Number of Steps: 4 Home Layout: One level Home Equipment: None      Prior Function Level of Independence: Independent         Comments: active and reports no falls     Hand Dominance        Extremity/Trunk Assessment   Upper Extremity Assessment Upper Extremity Assessment: Overall WFL for tasks assessed    Lower Extremity Assessment Lower Extremity Assessment: Overall WFL for tasks assessed    Cervical / Trunk Assessment Cervical / Trunk Assessment: Normal  Communication   Communication: No difficulties  Cognition Arousal/Alertness: Awake/alert Behavior During Therapy: WFL for tasks assessed/performed Overall Cognitive Status: Within Functional Limits for tasks assessed                                        General Comments      Exercises     Assessment/Plan    PT Assessment Patent does not need any further PT services  PT Problem List         PT Treatment Interventions      PT Goals (Current goals can be found in the Care Plan section)       Frequency  Barriers to discharge        Co-evaluation               AM-PAC PT "6 Clicks" Mobility  Outcome Measure Help needed turning from your back to your side while in a flat bed without using bedrails?: None Help needed moving from lying on your back to sitting on the side of a flat bed without using bedrails?: None Help needed moving to and from a bed to a chair (including a wheelchair)?: None Help needed standing up from a chair using your arms (e.g., wheelchair or bedside chair)?: None Help needed to walk in hospital room?: None Help needed climbing 3-5 steps with a railing? : None 6 Click Score: 24    End of Session Equipment Utilized During Treatment: Gait belt Activity Tolerance: Patient tolerated treatment well Patient left: in chair;with call bell/phone within reach;with chair alarm set;with SCD's  reapplied Nurse Communication: Mobility status PT Visit Diagnosis: Other abnormalities of gait and mobility (R26.89)    Time: 1115-1130 PT Time Calculation (min) (ACUTE ONLY): 15 min   Charges:   PT Evaluation $PT Eval Low Complexity: 1 Low         Lieutenant Diego PT, DPT 12:56 PM,10/23/19

## 2019-10-23 NOTE — Consult Note (Signed)
PHARMACY CONSULT NOTE - FOLLOW UP  Pharmacy Consult for Electrolyte Monitoring and Replacement   Recent Labs: Potassium (mmol/L)  Date Value  10/23/2019 3.8   Magnesium (mg/dL)  Date Value  09/13/2019 2.1   Calcium (mg/dL)  Date Value  10/23/2019 9.1   Albumin (g/dL)  Date Value  10/22/2019 3.4 (L)   Phosphorus (mg/dL)  Date Value  09/13/2019 3.5   Sodium (mmol/L)  Date Value  10/23/2019 134 (L)     Assessment: Pharmacy consulted to replace electrolytes. 64 y.o. male with a known history of stage IIIB hepatocellular carcinoma on chemotherapy at the cancer center. He had labs done on 10/21/2019 revealed anAST 191,ALT 195, alkaline phosphatase 842, and8.4.  Goal of Therapy:  WNL  Plan:  Electrolytes WNL this morning. No replacement needed at this time.   Pharmacy will F/u with AM labs and continue to replace as needed.   Pernell Dupre, PharmD, BCPS Clinical Pharmacist 10/23/2019 7:06 AM

## 2019-10-23 NOTE — Plan of Care (Signed)
  Problem: Education: Goal: Knowledge of General Education information will improve Description: Including pain rating scale, medication(s)/side effects and non-pharmacologic comfort measures 10/23/2019 0000 by Levonne Hubert, RN Outcome: Progressing 10/22/2019 2359 by Levonne Hubert, RN Outcome: Progressing   Problem: Health Behavior/Discharge Planning: Goal: Ability to manage health-related needs will improve 10/23/2019 0000 by Levonne Hubert, RN Outcome: Progressing 10/22/2019 2359 by Levonne Hubert, RN Outcome: Progressing   Problem: Clinical Measurements: Goal: Ability to maintain clinical measurements within normal limits will improve 10/23/2019 0000 by Levonne Hubert, RN Outcome: Progressing 10/22/2019 2359 by Levonne Hubert, RN Outcome: Progressing Goal: Will remain free from infection 10/23/2019 0000 by Levonne Hubert, RN Outcome: Progressing 10/22/2019 2359 by Levonne Hubert, RN Outcome: Progressing Goal: Diagnostic test results will improve 10/23/2019 0000 by Levonne Hubert, RN Outcome: Progressing 10/22/2019 2359 by Levonne Hubert, RN Outcome: Progressing Goal: Respiratory complications will improve 10/23/2019 0000 by Levonne Hubert, RN Outcome: Progressing 10/22/2019 2359 by Levonne Hubert, RN Outcome: Progressing Goal: Cardiovascular complication will be avoided 10/23/2019 0000 by Levonne Hubert, RN Outcome: Progressing 10/22/2019 2359 by Levonne Hubert, RN Outcome: Progressing   Problem: Activity: Goal: Risk for activity intolerance will decrease 10/23/2019 0000 by Levonne Hubert, RN Outcome: Progressing 10/22/2019 2359 by Levonne Hubert, RN Outcome: Progressing   Problem: Nutrition: Goal: Adequate nutrition will be maintained 10/23/2019 0000 by Levonne Hubert, RN Outcome: Progressing 10/22/2019 2359 by Levonne Hubert, RN Outcome: Progressing   Problem: Coping: Goal: Level of anxiety will decrease 10/23/2019 0000 by Levonne Hubert, RN Outcome:  Progressing 10/22/2019 2359 by Levonne Hubert, RN Outcome: Progressing   Problem: Elimination: Goal: Will not experience complications related to bowel motility 10/23/2019 0000 by Levonne Hubert, RN Outcome: Progressing 10/22/2019 2359 by Levonne Hubert, RN Outcome: Progressing Goal: Will not experience complications related to urinary retention 10/23/2019 0000 by Levonne Hubert, RN Outcome: Progressing 10/22/2019 2359 by Levonne Hubert, RN Outcome: Progressing   Problem: Pain Managment: Goal: General experience of comfort will improve 10/23/2019 0000 by Levonne Hubert, RN Outcome: Progressing 10/22/2019 2359 by Levonne Hubert, RN Outcome: Progressing   Problem: Safety: Goal: Ability to remain free from injury will improve 10/23/2019 0000 by Levonne Hubert, RN Outcome: Progressing 10/22/2019 2359 by Levonne Hubert, RN Outcome: Progressing   Problem: Skin Integrity: Goal: Risk for impaired skin integrity will decrease 10/23/2019 0000 by Levonne Hubert, RN Outcome: Progressing 10/22/2019 2359 by Levonne Hubert, RN Outcome: Progressing

## 2019-10-23 NOTE — Hospital Course (Addendum)
Chris Mcdonald  is a 64 y.o. male with a known history of stage IIIB hepatocellular carcinoma on chemotherapy at the cancer center. Patient follows with Dr. Mike Gip.  Admitted on 10/22/19 for worsening LFT's and jaundice.  Patient reported symptoms of itchy head and arms after his chemotherapy, and breaking out in transient red spots that are not bothersome.  No fevers/chills, abdominal pain, nausea or vomiting.  He was seen at the cancer center on day of admission.  He'd had labs the day before on 10/21/2019 which showed AST 191, ALT 195, alkPhos 842, and Tbili 8.4.  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  Repeat labs on admission 10/22/19 revealed worsening liver function with Tbili 8.4>>11.3, AST 191>>221, ALT 195>>218.  Dr. Mike Gip discussed with GI who recommended MRCP to evaluate for obstruction and possible biliary drain placement.    MRCP Impression (summarized):  1. ... 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 2. Mild biliary ductal dilatation in the right lobe appears slightly increased in comparison to prior examination dated 10/07/2019 and as noted previously, gradually increased in comparison to multiple more remote prior examinations.Marland KitchenMarland Kitchen

## 2019-10-23 NOTE — Plan of Care (Deleted)
  Problem: Education: Goal: Knowledge of General Education information will improve Description: Including pain rating scale, medication(s)/side effects and non-pharmacologic comfort measures 10/23/2019 0000 by Levonne Hubert, RN Outcome: Progressing 10/23/2019 0000 by Levonne Hubert, RN Outcome: Progressing 10/22/2019 2359 by Levonne Hubert, RN Outcome: Progressing   Problem: Health Behavior/Discharge Planning: Goal: Ability to manage health-related needs will improve 10/23/2019 0000 by Levonne Hubert, RN Outcome: Progressing 10/23/2019 0000 by Levonne Hubert, RN Outcome: Progressing 10/22/2019 2359 by Levonne Hubert, RN Outcome: Progressing   Problem: Clinical Measurements: Goal: Ability to maintain clinical measurements within normal limits will improve 10/23/2019 0000 by Levonne Hubert, RN Outcome: Progressing 10/23/2019 0000 by Levonne Hubert, RN Outcome: Progressing 10/22/2019 2359 by Levonne Hubert, RN Outcome: Progressing Goal: Will remain free from infection 10/23/2019 0000 by Levonne Hubert, RN Outcome: Progressing 10/23/2019 0000 by Levonne Hubert, RN Outcome: Progressing 10/22/2019 2359 by Levonne Hubert, RN Outcome: Progressing Goal: Diagnostic test results will improve 10/23/2019 0000 by Levonne Hubert, RN Outcome: Progressing 10/23/2019 0000 by Levonne Hubert, RN Outcome: Progressing 10/22/2019 2359 by Levonne Hubert, RN Outcome: Progressing Goal: Respiratory complications will improve 10/23/2019 0000 by Levonne Hubert, RN Outcome: Progressing 10/23/2019 0000 by Levonne Hubert, RN Outcome: Progressing 10/22/2019 2359 by Levonne Hubert, RN Outcome: Progressing Goal: Cardiovascular complication will be avoided 10/23/2019 0000 by Levonne Hubert, RN Outcome: Progressing 10/23/2019 0000 by Levonne Hubert, RN Outcome: Progressing 10/22/2019 2359 by Levonne Hubert, RN Outcome: Progressing   Problem: Activity: Goal: Risk for activity intolerance will  decrease 10/23/2019 0000 by Levonne Hubert, RN Outcome: Progressing 10/23/2019 0000 by Levonne Hubert, RN Outcome: Progressing 10/22/2019 2359 by Levonne Hubert, RN Outcome: Progressing   Problem: Nutrition: Goal: Adequate nutrition will be maintained 10/23/2019 0000 by Levonne Hubert, RN Outcome: Progressing 10/23/2019 0000 by Levonne Hubert, RN Outcome: Progressing 10/22/2019 2359 by Levonne Hubert, RN Outcome: Progressing   Problem: Coping: Goal: Level of anxiety will decrease 10/23/2019 0000 by Levonne Hubert, RN Outcome: Progressing 10/23/2019 0000 by Levonne Hubert, RN Outcome: Progressing 10/22/2019 2359 by Levonne Hubert, RN Outcome: Progressing   Problem: Elimination: Goal: Will not experience complications related to bowel motility 10/23/2019 0000 by Levonne Hubert, RN Outcome: Progressing 10/23/2019 0000 by Levonne Hubert, RN Outcome: Progressing 10/22/2019 2359 by Levonne Hubert, RN Outcome: Progressing Goal: Will not experience complications related to urinary retention 10/23/2019 0000 by Levonne Hubert, RN Outcome: Progressing 10/23/2019 0000 by Levonne Hubert, RN Outcome: Progressing 10/22/2019 2359 by Levonne Hubert, RN Outcome: Progressing   Problem: Pain Managment: Goal: General experience of comfort will improve 10/23/2019 0000 by Levonne Hubert, RN Outcome: Progressing 10/23/2019 0000 by Levonne Hubert, RN Outcome: Progressing 10/22/2019 2359 by Levonne Hubert, RN Outcome: Progressing   Problem: Safety: Goal: Ability to remain free from injury will improve 10/23/2019 0000 by Levonne Hubert, RN Outcome: Progressing 10/23/2019 0000 by Levonne Hubert, RN Outcome: Progressing 10/22/2019 2359 by Levonne Hubert, RN Outcome: Progressing   Problem: Skin Integrity: Goal: Risk for impaired skin integrity will decrease 10/23/2019 0000 by Levonne Hubert, RN Outcome: Progressing 10/23/2019 0000 by Levonne Hubert, RN Outcome:  Progressing 10/22/2019 2359 by Levonne Hubert, RN Outcome: Progressing

## 2019-10-23 NOTE — Progress Notes (Signed)
Initial Nutrition Assessment  RD working remotely.  DOCUMENTATION CODES:   Underweight  INTERVENTION:  - will order Boost Breeze once/day, each supplement provides 250 kcal and 9 grams of protein. - will order Dillard [place] 1.4 orally BID, each supplement provides 455 kcal and 20 grams protein. - will order 1 tablet multivitamin with minerals/day.  - will complete NFPE at follow-up.   NUTRITION DIAGNOSIS:   Increased nutrient needs related to acute illness, chronic illness, cancer and cancer related treatments as evidenced by estimated needs.  GOAL:   Patient will meet greater than or equal to 90% of their needs  MONITOR:   PO intake, Supplement acceptance, Labs, Weight trends  REASON FOR ASSESSMENT:   Consult Assessment of nutrition requirement/status  ASSESSMENT:   64 y.o. male with a medical history of stage IIIB hepatocellular carcinoma on chemotherapy. He presented from the [place] d/t abnormal labs and feeling itchy on head and arms with rash present. 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 No N/V/abd pain PTA but has been losing unknown amount of weight. Stat MRCP ordered.  No intakes documented since admission. Weight yesterday was 109 lb and weight has been fairly stable since 09/13/19.   Patient is being followed by [place] RD and last assessment was on 5/3 (plan was for follow-up 6/14, but patient was unable to be reached by phone). On 5/3 patient informed RD that his weight continued to trend down but that appetite was good overall. He was focusing on protein with meals and drinking 2-3 protein shakes/day.   Suspect patient meets criteria for some degree of malnutrition, but unable to confirm without NFPE.   Per notes: - obstructive jaundice with hx of hepatic carcinoma - hypokalemia--resolved s/p repletion - malnutrition   Labs reviewed; Na: 134 mmol/l. Medications reviewed; 40 mEq Klor-Con x2 doses 7/2.    NUTRITION -  FOCUSED PHYSICAL EXAM:  unable to complete at this time.   Diet Order:   Diet Order            Diet Heart Room service appropriate? Yes; Fluid consistency: Thin  Diet effective now                 EDUCATION NEEDS:   No education needs have been identified at this time  Skin:  Skin Assessment: Reviewed RN Assessment  Last BM:  PTA/unknown  Height:   Ht Readings from Last 1 Encounters:  10/22/19 5\' 7"  (1.702 m)    Weight:   Wt Readings from Last 1 Encounters:  10/22/19 49.5 kg    Estimated Nutritional Needs:  Kcal:  2000-2275 kcal Protein:  105-120 grams Fluid:  >/= 2 L/day     Jarome Matin, MS, RD, LDN, CNSC Inpatient Clinical Dietitian RD pager # available in [facility]  After hours/weekend pager # available in [facility]

## 2019-10-23 NOTE — Progress Notes (Addendum)
PROGRESS NOTE    Chris Mcdonald   WUJ:[id number]  DOB: Sep 06, 1955  PCP: Donnie Coffin, MD    DOA: 10/22/2019 LOS: 1   Brief Narrative   Chris Mcdonald  is a 64 y.o. male with a known history of stage IIIB hepatocellular carcinoma on chemotherapy at the cancer center. Patient follows with Dr. Mike Gip.  Admitted on 10/22/19 for worsening LFT's and jaundice.  Patient reported symptoms of itchy head and arms after his chemotherapy, and breaking out in transient red spots that are not bothersome.  No fevers/chills, abdominal pain, nausea or vomiting.  He was seen at the cancer center on day of admission.  He'd had labs the day before on 10/21/2019 which showed AST 191, ALT 195, alkPhos 842, and Tbili 8.4.  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  Repeat labs on admission 10/22/19 revealed worsening liver function with Tbili 8.4>>11.3, AST 191>>221, ALT 195>>218.  Dr. Mike Gip discussed with GI who recommended MRCP to evaluate for obstruction.    MRCP Impression (summarized):  1. ... 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 2. Mild biliary ductal dilatation in the right lobe appears slightly increased in comparison to prior examination dated 10/07/2019 and as noted previously, gradually increased in comparison to multiple more remote prior examinations...    Assessment & Plan   Principal Problem:   Obstructive jaundice due to malignant neoplasm [facility]) Active Problems:   Hypokalemia   Elevated LFTs   Hyperbilirubinemia   Hepatocellular carcinoma (HCC)   Moderate protein-calorie malnutrition (HCC)   Essential hypertension   Gastroesophageal reflux disease without esophagitis   Obstructive jaundie due to malignant neoplasm / Elevated  LFTs / Hyperbilirubinemia Present on admission, see narrative above.  IR consulted for evaluation and consideration of percutaneous biliary drain placement.  Monitor LFT's and INR daily.  Hypokalemia - POA with K 3.0, replaced.  Monitor daily and replace as needed.  Patient takes KCl 20 mEq daily at home.  Hepatocellular carcinoma, stage IIIb - diagnosed in Feb 2020, followed by Dr. Mike Gip, on chemotherapy s/p cycle #1 of nivolumab 9 days prior to admission.       Moderate protein-calorie malnutrition - dietician consulted.  Essential hypertension - hold home HCTZ as BP's are soft.  GERD - continue home PPI.  Muscle spasms - pt has chronic muscle spasms, worse with immobility.  PRN Robaxin.  Peripheral neuropathy - continue gabapentin.   Patient BMI: Body mass index is 17.09 kg/m.   DVT prophylaxis: heparin injection 5,000 Units Start: 10/22/19 2200 SCDs Start: 10/22/19 1652   Diet:  Diet Orders (From admission, onward)    Start     Ordered   10/22/19 1652  Diet Heart Room service appropriate? Yes; Fluid consistency: Thin  Diet effective now       Question Answer Comment  Room service appropriate? Yes   Fluid consistency: Thin      10/22/19 1651            Code Status: Full Code    Subjective 10/23/19    Patient seen this AM at bedside.  He reports feeling nauseous, no vomiting.  Itching is a little better.  Denies any abdominal pain.  Says he's having back spasms from laying in the bed, needs to get up moving  around. Denies other complaints.   Disposition Plan & Communication   Status is: Inpatient  Remains inpatient appropriate because:Inpatient level of care appropriate due to severity of illness   Dispo: The patient is from: Home              Anticipated d/c is to: Home              Anticipated d/c date is: 2 days              Patient currently is not medically stable to d/c.   Family Communication: none at bedside, will attempt to call.     Consults, Procedures, Significant Events   Consultants:   Oncology  Interventional radiology  Procedures:   none  Antimicrobials:   none    Objective   Vitals:   10/22/19 1947 10/22/19 2308 10/23/19 0357 10/23/19 0724  BP: 104/64 92/70 108/74 99/76  Pulse: 77 68 64 65  Resp: 16 16 16 17   Temp: 98.6 F (37 C) 97.8 F (36.6 C) 98.6 F (37 C) 97.9 F (36.6 C)  TempSrc: Oral Oral Oral Oral  SpO2: 100% 98% 98% 99%  Weight:      Height:        Intake/Output Summary (Last 24 hours) at 10/23/2019 0745 Last data filed at 10/23/2019 0500 Gross per 24 hour  Intake 76.94 ml  Output 300 ml  Net -223.06 ml   Filed Weights   10/22/19 1851  Weight: 49.5 kg    Physical Exam:  General exam: awake, alert, no acute distress, cachectic HEENT: icteric sclera, moist mucus membranes, hearing grossly normal  Respiratory system: CTAB, no wheezes, rales or rhonchi, normal respiratory effort. Cardiovascular system: normal S1/S2, RRR, no pedal edema.   Gastrointestinal system: soft, NT, ND, +bowel sounds. Central nervous system: A&O x4. no gross focal neurologic deficits, normal speech Extremities: moves all, no edema, normal tone Skin: jaundice, dry, intact, normal temperature Psychiatry: normal mood, congruent affect, judgement and insight appear normal  Labs   Data Reviewed: I have personally reviewed following labs and imaging studies  CBC: No results for input(s): WBC, NEUTROABS, HGB, HCT, MCV, PLT in the last 168 hours. Basic Metabolic Panel: Recent Labs  Lab 10/21/19 1013 10/22/19 1126 10/23/19 0611  NA 133* 135 134*  K 3.1* 3.0* 3.8  CL 100 99 102  CO2 23 25 25   GLUCOSE 115* 92 93  BUN 20 18 18   CREATININE 0.64 0.68 0.61  CALCIUM 9.0 9.4 9.1   GFR: Estimated Creatinine Clearance: 67.9 mL/min (by C-G formula based on SCr of 0.61 mg/dL). Liver Function Tests: Recent Labs  Lab 10/21/19 1013 10/22/19 1126  AST 191* 221*  ALT 195* 218*  ALKPHOS 842*  970*  BILITOT 8.4* 11.3*  PROT 7.5 7.7  ALBUMIN 3.4* 3.4*   No results for input(s): LIPASE, AMYLASE in the last 168 hours. No results for input(s): AMMONIA in the last 168 hours. Coagulation Profile: Recent Labs  Lab 10/22/19 1126  INR 1.0   Cardiac Enzymes: No results for input(s): CKTOTAL, CKMB, CKMBINDEX, TROPONINI in the last 168 hours. BNP (last 3 results) No results for input(s): PROBNP in the last 8760 hours. HbA1C: No results for input(s): HGBA1C in the last 72 hours. CBG: No results for input(s): GLUCAP in the last 168 hours. Lipid Profile: No results for input(s): CHOL, HDL, LDLCALC, TRIG, CHOLHDL, LDLDIRECT in the last 72 hours. Thyroid Function Tests: No results for input(s): TSH, T4TOTAL, FREET4, T3FREE, THYROIDAB in the last 72  hours. Anemia Panel: No results for input(s): VITAMINB12, FOLATE, FERRITIN, TIBC, IRON, RETICCTPCT in the last 72 hours. Sepsis Labs: No results for input(s): PROCALCITON, LATICACIDVEN in the last 168 hours.  No results found for this or any previous visit (from the past 240 hour(s)).    Imaging Studies   MR ABDOMEN MRCP W WO CONTAST  Result Date: 10/22/2019 CLINICAL DATA:  Hepatitis C, multifocal HCC, worsening liver enzymes and elevated bilirubin, chemotherapy EXAM: MRI ABDOMEN WITHOUT AND WITH CONTRAST (INCLUDING MRCP) TECHNIQUE: Multiplanar multisequence MR imaging of the abdomen was performed both before and after the administration of intravenous contrast. Heavily T2-weighted images of the biliary and pancreatic ducts were obtained, and three-dimensional MRCP images were rendered by post processing. CONTRAST:  42mL GADAVIST GADOBUTROL 1 MMOL/ML IV SOLN COMPARISON:  MR abdomen, 10/07/2019, 08/26/2019, 06/15/2019 FINDINGS: Lower chest: No acute findings. Hepatobiliary: No significant interval change in a large, branching, hypoenhancing mass of the right lobe of the liver, with extensive associated atrophy of the right lobe. The dominant  component in the central right lobe of the liver extends into and occludes the central right portal vein. The central bulk of the mass measures approximately 4.2 x 3.2 cm, not significantly changed in size and appearance compared to prior examination when measured similarly (series 20, image 25). Unchanged additional component in the dome of the liver measuring 3.2 x 2.3 cm (series 20, image 10). These demonstrate subtle arterial rim hyperenhancement (series 18, image 26, 10) and there are again multiple small foci of transient arterial hyperenhancement of the right lobe (e.g. Series 18, image 29). Compensatory hypertrophy of the left lobe of the liver. Degree of biliary ductal dilatation in the right lobe appears slightly increased in comparison to prior examination and as noted previously, is gradually increased in comparison multiple more remote prior examinations. Mild biliary ductal dilatation in the left lobe is not significantly changed in comparison to most recent prior examination dated 10/07/2019 but clearly new when compared to more remote prior examinations. MRCP is severely degraded by motion artifact. Pancreas: No mass, inflammatory changes, or other parenchymal abnormality identified. Spleen:  Within normal limits in size and appearance. Adrenals/Urinary Tract: No masses identified. No evidence of hydronephrosis. Stomach/Bowel: Visualized portions within the abdomen are unremarkable. Vascular/Lymphatic: No pathologically enlarged lymph nodes identified. No abdominal aortic aneurysm demonstrated. Other:  None. Musculoskeletal: No suspicious bone lesions identified. IMPRESSION: 1. No significant interval change in a large, branching, rim hyperenhancing mass of the right lobe of the liver, with extensive associated atrophy of the right lobe of the liver. The dominant component in the central right lobe of the liver extends into and occludes the central right portal vein, not significantly changed in size  and appearance compared to prior examination when measured similarly. Unchanged additional component in the dome of the liver. 2. Mild biliary ductal dilatation in the right lobe appears slightly increased in comparison to prior examination dated 10/07/2019 and as noted previously, gradually increased in comparison to multiple more remote prior examinations. Mild biliary ductal dilatation in the left lobe is not significantly changed in comparison to most recent prior examination dated 10/07/2019 but clearly new when compared to more remote prior examinations. Electronically Signed   By: Eddie Candle M.D.   On: 10/22/2019 19:21   US ABDOMEN LIMITED RUQ  Result Date: 10/22/2019 CLINICAL DATA:  Elevated liver enzymes EXAM: ULTRASOUND ABDOMEN LIMITED RIGHT UPPER QUADRANT COMPARISON:  Abdominal MR October 07 2019 FINDINGS: Gallbladder: No gallstones or wall thickening visualized. No  evident pericholecystic fluid. No sonographic Murphy sign noted by sonographer. Common bile duct: Diameter: 2 mm. No extrahepatic biliary duct dilatation is evident. Central biliary duct prominence in the periportal region appear similar to MR. Liver: No focal lesion identified. Within normal limits in parenchymal echogenicity. Thrombus is seen within the portal vein, incompletely obstructing. Flow in the portal vein is in the anatomic direction. Other: None. IMPRESSION: 1. 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 Flow in the portal vein is in the anatomic direction. 2. The apparent periportal mass seen on recent MR is not distinguishable by ultrasound. Note that there is central intrahepatic biliary duct dilatation in this region. There is no appreciable extrahepatic biliary duct dilatation. 3.  No gallbladder pathology demonstrable on this study. Electronically Signed   By: Lowella Grip III M.D.   On: 10/22/2019 10:27     Medications   Scheduled Meds: . heparin  5,000 Units Subcutaneous  Q8H  . sodium chloride flush  3 mL Intravenous Q12H   Continuous Infusions: . sodium chloride 10 mL/hr at 10/23/19 0500       LOS: 1 day    Time spent: 35 minutes with >50% spent in coordination of care and direct patient contact.    Chris Slocumb, DO [facility]  10/23/2019, 7:45 AM    If 7PM-7AM, please contact night-coverage. How to contact the [facility] Attending or Consulting provider [place] or covering provider during after hours [place], for this patient?    1. Check the care team in [facility] and look for a) attending/consulting [facility] provider listed and b) the [facility] team listed 2. Log into [email or web address] and use [place] universal password to access. If you do not have the password, please contact the hospital operator. 3. Locate the [facility] provider you are looking for under [facility] and page to a number that you can be directly reached. 4. If you still have difficulty reaching the provider, please page the [facility] (Director on Call) for the Hospitalists listed on amion for assistance.

## 2019-10-24 DIAGNOSIS — C220 Liver cell carcinoma: Secondary | ICD-10-CM

## 2019-10-24 LAB — COMPREHENSIVE METABOLIC PANEL
ALT: 168 U/L — ABNORMAL HIGH (ref 0–44)
AST: 170 U/L — ABNORMAL HIGH (ref 15–41)
Albumin: 2.6 g/dL — ABNORMAL LOW (ref 3.5–5.0)
Alkaline Phosphatase: 773 U/L — ABNORMAL HIGH (ref 38–126)
Anion gap: 7 (ref 5–15)
BUN: 11 mg/dL (ref 8–23)
CO2: 26 mmol/L (ref 22–32)
Calcium: 8.9 mg/dL (ref 8.9–10.3)
Chloride: 102 mmol/L (ref 98–111)
Creatinine, Ser: 0.51 mg/dL — ABNORMAL LOW (ref 0.61–1.24)
GFR calc Af Amer: >60 mL/min (ref >60)
GFR calc non Af Amer: >60 mL/min (ref >60)
Glucose, Bld: 91 mg/dL (ref 70–99)
Potassium: 3.7 mmol/L (ref 3.5–5.1)
Sodium: 135 mmol/L (ref 135–145)
Total Bilirubin: 13.1 mg/dL — ABNORMAL HIGH (ref 0.3–1.2)
Total Protein: 6.2 g/dL — ABNORMAL LOW (ref 6.5–8.1)

## 2019-10-24 LAB — PROTIME-INR
INR: 1 (ref 0.8–1.2)
Prothrombin Time: 12.8 seconds (ref 11.4–15.2)

## 2019-10-24 LAB — MAGNESIUM: Magnesium: 1.9 mg/dL (ref 1.7–2.4)

## 2019-10-24 LAB — CBC
HCT: 29.4 % — ABNORMAL LOW (ref 39.0–52.0)
Hemoglobin: 10.8 g/dL — ABNORMAL LOW (ref 13.0–17.0)
MCH: 32.6 pg (ref 26.0–34.0)
MCHC: 36.7 g/dL — ABNORMAL HIGH (ref 30.0–36.0)
MCV: 88.8 fL (ref 80.0–100.0)
Platelets: 184 10*3/uL (ref 150–400)
RBC: 3.31 MIL/uL — ABNORMAL LOW (ref 4.22–5.81)
RDW: 19.2 % — ABNORMAL HIGH (ref 11.5–15.5)
WBC: 5.1 10*3/uL (ref 4.0–10.5)
nRBC: 0 % (ref 0.0–0.2)

## 2019-10-24 NOTE — Consult Note (Signed)
[place]  [phone] [phone]  ID: Chris Mcdonald OB: 1956/02/11  MR#: [id number]  IOX#:[id number]  Patient Care Team: Donnie Coffin, MD as PCP - General (Family Medicine) Ok Edwards, NP as Nurse Practitioner (Gastroenterology) Lollie Sails, MD (Inactive) as Consulting Physician (Gastroenterology)  CHIEF COMPLAINT: Hepatocellular carcinoma, increasing total bilirubin.  INTERVAL HISTORY: Patient is a 64 year old male who is directly admitted to the hospital from oncology clinic in [facility] on Friday for rapidly increasing total bilirubin.  Plan was to have MRCP to help relieve obstruction.  Currently, patient feels well.  He has no neurologic complaints.  He denies any fevers.  He has a fair appetite, and denies weight loss.  He has no chest pain, shortness of breath, cough, or hemoptysis.  He does not complain of abdominal pain today.  He denies any nausea, vomiting, constipation, or diarrhea.  He has no urinary complaints.  Patient offers no specific complaints today.  REVIEW OF SYSTEMS:   Review of Systems  Constitutional: Positive for malaise/fatigue. Negative for fever and weight loss.  Respiratory: Negative.  Negative for cough, hemoptysis and shortness of breath.   Cardiovascular: Negative.  Negative for chest pain and leg swelling.  Gastrointestinal: Negative.  Negative for abdominal pain and nausea.  Genitourinary: Negative.  Negative for dysuria.  Musculoskeletal: Negative.  Negative for back pain.  Skin: Negative.  Negative for rash.  Neurological: Positive for weakness. Negative for dizziness, focal weakness and headaches.  Psychiatric/Behavioral: Negative.  The patient is not nervous/anxious.     As per HPI. Otherwise, a complete review of systems is negative.  PAST MEDICAL HISTORY: Past Medical History:  Diagnosis Date  . Arthritis   . Hepatitis   . Hypertension   . Liver cancer ([place])   . Spinal  stenosis   . Stroke [facility])     PAST SURGICAL HISTORY: Past Surgical History:  Procedure Laterality Date  . COLONOSCOPY WITH PROPOFOL N/A 02/21/2015   Procedure: COLONOSCOPY WITH PROPOFOL;  Surgeon: Lollie Sails, MD;  Location: [facility] ENDOSCOPY;  Service: Endoscopy;  Laterality: N/A;  . INGUINAL HERNIA REPAIR Bilateral   . IR RADIOLOGIST EVAL & MGMT  08/04/2018  . POSTERIOR CERVICAL VERTEBRAE EXCISION      FAMILY HISTORY: Family History  Problem Relation Age of Onset  . Cancer Brother   . Heart disease Brother   . Cancer Mother     ADVANCED DIRECTIVES (Y/N):  @ADVDIR @  HEALTH MAINTENANCE: Social History   Tobacco Use  . Smoking status: Current Every Day Smoker    Packs/day: 1.50    Years: 50.00    Pack years: 75.00    Types: Cigarettes  . Smokeless tobacco: Never Used  Vaping Use  . Vaping Use: Never used  Substance Use Topics  . Alcohol use: No  . Drug use: No     Colonoscopy:  PAP:  Bone density:  Lipid panel:  No Known Allergies  Current Facility-Administered Medications  Medication Dose Route Frequency Provider Last Rate Last Admin  . 0.9 %  sodium chloride infusion  250 mL Intravenous PRN Fritzi Mandes, MD 10 mL/hr at 10/23/19 0500 Rate Verify at 10/23/19 0500  . diphenhydrAMINE (BENADRYL) capsule 25 mg  25 mg Oral Q6H PRN Nicole Kindred A, DO      . feeding supplement (BOOST / RESOURCE BREEZE) liquid 1 Container  1 Container Oral Q24H Nicole Kindred A, DO   1 Container at 10/23/19 1400  . feeding supplement (KATE FARMS STANDARD 1.4) liquid  325 mL  325 mL Oral BID BM Nicole Kindred A, DO   237 mL at 10/24/19 1041  . gabapentin (NEURONTIN) capsule 100 mg  100 mg Oral BID Nicole Kindred A, DO   100 mg at 10/24/19 1039  . ibuprofen (ADVIL) tablet 400 mg  400 mg Oral Q6H PRN Nicole Kindred A, DO      . methocarbamol (ROBAXIN) tablet 1,000 mg  1,000 mg Oral Q6H PRN Nicole Kindred A, DO      . multivitamin with minerals tablet 1 tablet  1 tablet Oral Daily  Nicole Kindred A, DO   1 tablet at 10/24/19 1039  . ondansetron (ZOFRAN) injection 4 mg  4 mg Intravenous Q6H PRN Nicole Kindred A, DO   4 mg at 10/23/19 1141  . senna-docusate (Senokot-S) tablet 1 tablet  1 tablet Oral QHS PRN Fritzi Mandes, MD      . sodium chloride flush (NS) 0.9 % injection 3 mL  3 mL Intravenous Q12H Fritzi Mandes, MD   3 mL at 10/23/19 2035  . sodium chloride flush (NS) 0.9 % injection 3 mL  3 mL Intravenous PRN Fritzi Mandes, MD      . traMADol Veatrice Bourbon) tablet 50 mg  50 mg Oral Q8H PRN Fritzi Mandes, MD   50 mg at 10/23/19 2041    OBJECTIVE: Vitals:   10/23/19 2348 10/24/19 0731  BP: 114/69 104/84  Pulse: 71 68  Resp: 16 16  Temp: 98.4 F (36.9 C) 97.8 F (36.6 C)  SpO2: 98% 99%     Body mass index is 17.09 kg/m.    ECOG FS:1 - Symptomatic but completely ambulatory  General: Thin, no acute distress. Eyes: Pink conjunctiva, icteric sclera. HEENT: Normocephalic, moist mucous membranes. Lungs: No audible wheezing or coughing. Heart: Regular rate and rhythm. Abdomen: Soft, nontender, no obvious distention. Musculoskeletal: No edema, cyanosis, or clubbing. Neuro: Alert, answering all questions appropriately. Cranial nerves grossly intact. Skin: No rashes or petechiae noted.  Jaundice noted. Psych: Normal affect.   LAB RESULTS:  Lab Results  Component Value Date   NA 135 10/24/2019   K 3.7 10/24/2019   CL 102 10/24/2019   CO2 26 10/24/2019   GLUCOSE 91 10/24/2019   BUN 11 10/24/2019   CREATININE 0.51 (L) 10/24/2019   CALCIUM 8.9 10/24/2019   PROT 6.2 (L) 10/24/2019   ALBUMIN 2.6 (L) 10/24/2019   AST 170 (H) 10/24/2019   ALT 168 (H) 10/24/2019   ALKPHOS 773 (H) 10/24/2019   BILITOT 13.1 (H) 10/24/2019   GFRNONAA >60 10/24/2019   GFRAA >60 10/24/2019    Lab Results  Component Value Date   WBC 5.1 10/24/2019   NEUTROABS 2.9 10/14/2019   HGB 10.8 (L) 10/24/2019   HCT 29.4 (L) 10/24/2019   MCV 88.8 10/24/2019   PLT 184 10/24/2019      STUDIES: MR Abdomen W Wo Contrast  Addendum Date: 10/14/2019   ADDENDUM REPORT: 10/14/2019 09:06 ADDENDUM: Over a series of prior studies there is definite increase in biliary ductal dilation. There is LEFT hepatic ductal distension that was not present and there is increasing intrahepatic biliary ductal dilation to the RIGHT hepatic lobe progressing from November until the current study. These results were called by telephone at the time of interpretation on 10/14/2019 at 9:06 am to provider [facility] , who verbally acknowledged these results. Electronically Signed   By: Zetta Bills M.D.   On: 10/14/2019 09:06   Result Date: 10/14/2019 CLINICAL DATA:  Elevated LFTs, follow-up hepatocellular  carcinoma. EXAM: MRI ABDOMEN WITHOUT AND WITH CONTRAST TECHNIQUE: Multiplanar multisequence MR imaging of the abdomen was performed both before and after the administration of intravenous contrast. CONTRAST:  65mL GADAVIST GADOBUTROL 1 MMOL/ML IV SOLN COMPARISON:  Multiple prior studies most recent Aug 26, 2019 FINDINGS: Lower chest: Lobular contour lung bases without gross consolidation or effusion. No pericardial fluid. Hepatobiliary: Extensive intrahepatic biliary ductal dilation and central masslike area in the hepatic hilum biased towards RIGHT hepatic lobe is unchanged compared to recent imaging. Some mild ductal distension of LEFT biliary radicles, perhaps slightly increased compared to the prior study best seen on single shot T2 weighted images (image 11, series 4) predominant ductal distension remains present in the RIGHT hepatic lobe and to a lesser extent the caudate. Study not performed as an MRCP. Diffuse diffusion abnormality throughout the RIGHT hepatic lobe also shows a similar appearance. Expansile portal venous thrombus which accounts for much of the mass like area extends into the main portal vein and central portion of the LEFT portal vein without occluding the structures, this finding is  unchanged. Central hypoenhancing area showing minimal enhancement on the subtraction data set measuring 5.2 x 3.0 cm in the axial plane on image 35 of series 16, approximately 5.1 x 3.0 cm previously Pancreas: No mass, inflammatory changes, or other parenchymal abnormality identified. Spleen:  Spleen normal size and contour without splenic lesion. Adrenals/Urinary Tract: Adrenal glands are normal. Symmetric renal enhancement. No hydronephrosis. Stomach/Bowel: Abundant stool in the colon. Limited assessment of gastrointestinal structures without acute process. Vascular/Lymphatic: Calcified and noncalcified atheromatous plaque of the abdominal aorta. No sign of aneurysm. No adenopathy. No adenopathy Other:  No ascites. Musculoskeletal: No suspicious bone lesions identified. IMPRESSION: 1. Question minimal increase in distension of LEFT intrahepatic and LEFT hepatic biliary ducts. This is minimal and there is overall very little interval change in the appearance of the mass, portal vein invasion and RIGHT intrahepatic biliary ductal dilation. 2. Expansile portal venous thrombus which accounts for much of the mass like area extends into the main portal vein and central portion of the LEFT portal vein without occluding the structures, this finding is unchanged. 3. No abdominal adenopathy. 4. Calcified and noncalcified atheromatous plaque of the abdominal aorta. Electronically Signed: By: Zetta Bills M.D. On: 10/07/2019 21:05   MR ABDOMEN MRCP W WO CONTAST  Result Date: 10/22/2019 CLINICAL DATA:  Hepatitis C, multifocal HCC, worsening liver enzymes and elevated bilirubin, chemotherapy EXAM: MRI ABDOMEN WITHOUT AND WITH CONTRAST (INCLUDING MRCP) TECHNIQUE: Multiplanar multisequence MR imaging of the abdomen was performed both before and after the administration of intravenous contrast. Heavily T2-weighted images of the biliary and pancreatic ducts were obtained, and three-dimensional MRCP images were rendered by post  processing. CONTRAST:  86mL GADAVIST GADOBUTROL 1 MMOL/ML IV SOLN COMPARISON:  MR abdomen, 10/07/2019, 08/26/2019, 06/15/2019 FINDINGS: Lower chest: No acute findings. Hepatobiliary: No significant interval change in a large, branching, hypoenhancing mass of the right lobe of the liver, with extensive associated atrophy of the right lobe. The dominant component in the central right lobe of the liver extends into and occludes the central right portal vein. The central bulk of the mass measures approximately 4.2 x 3.2 cm, not significantly changed in size and appearance compared to prior examination when measured similarly (series 20, image 25). Unchanged additional component in the dome of the liver measuring 3.2 x 2.3 cm (series 20, image 10). These demonstrate subtle arterial rim hyperenhancement (series 18, image 26, 10) and there are again multiple small  foci of transient arterial hyperenhancement of the right lobe (e.g. Series 18, image 29). Compensatory hypertrophy of the left lobe of the liver. Degree of biliary ductal dilatation in the right lobe appears slightly increased in comparison to prior examination and as noted previously, is gradually increased in comparison multiple more remote prior examinations. Mild biliary ductal dilatation in the left lobe is not significantly changed in comparison to most recent prior examination dated 10/07/2019 but clearly new when compared to more remote prior examinations. MRCP is severely degraded by motion artifact. Pancreas: No mass, inflammatory changes, or other parenchymal abnormality identified. Spleen:  Within normal limits in size and appearance. Adrenals/Urinary Tract: No masses identified. No evidence of hydronephrosis. Stomach/Bowel: Visualized portions within the abdomen are unremarkable. Vascular/Lymphatic: No pathologically enlarged lymph nodes identified. No abdominal aortic aneurysm demonstrated. Other:  None. Musculoskeletal: No suspicious bone lesions  identified. IMPRESSION: 1. No significant interval change in a large, branching, rim hyperenhancing mass of the right lobe of the liver, with extensive associated atrophy of the right lobe of the liver. The dominant component in the central right lobe of the liver extends into and occludes the central right portal vein, not significantly changed in size and appearance compared to prior examination when measured similarly. Unchanged additional component in the dome of the liver. 2. Mild biliary ductal dilatation in the right lobe appears slightly increased in comparison to prior examination dated 10/07/2019 and as noted previously, gradually increased in comparison to multiple more remote prior examinations. Mild biliary ductal dilatation in the left lobe is not significantly changed in comparison to most recent prior examination dated 10/07/2019 but clearly new when compared to more remote prior examinations. Electronically Signed   By: Eddie Candle M.D.   On: 10/22/2019 19:21   US ABDOMEN LIMITED RUQ  Result Date: 10/22/2019 CLINICAL DATA:  Elevated liver enzymes EXAM: ULTRASOUND ABDOMEN LIMITED RIGHT UPPER QUADRANT COMPARISON:  Abdominal MR October 07 2019 FINDINGS: Gallbladder: No gallstones or wall thickening visualized. No evident pericholecystic fluid. No sonographic Murphy sign noted by sonographer. Common bile duct: Diameter: 2 mm. No extrahepatic biliary duct dilatation is evident. Central biliary duct prominence in the periportal region appear similar to MR. Liver: No focal lesion identified. Within normal limits in parenchymal echogenicity. Thrombus is seen within the portal vein, incompletely obstructing. Flow in the portal vein is in the anatomic direction. Other: None. IMPRESSION: 1. 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 Flow in the portal vein is in the anatomic direction. 2. The apparent periportal mass seen on recent MR is not distinguishable by  ultrasound. Note that there is central intrahepatic biliary duct dilatation in this region. There is no appreciable extrahepatic biliary duct dilatation. 3.  No gallbladder pathology demonstrable on this study. Electronically Signed   By: Lowella Grip III M.D.   On: 10/22/2019 10:27    ASSESSMENT: Hepatocellular carcinoma, increasing total bilirubin.  PLAN:    1.  Stage IIIc hepatocellular carcinoma: Abdominal MRI on October 07, 2019 revealed no progression of disease.  Portal vein occlusion was also unchanged.  He is currently receiving single agent nivolumab.  Plan is to continue current treatment upon discharge and patient has been instructed to keep his follow-up with Dr. Mike Gip as scheduled.  Case discussed with Dr. Mike Gip and Dr. Arbutus Ped. 2.  Increasing total bilirubin: Patient noted to have a rapid increase in total bilirubin.  Results were within normal limits on October 07, 2019 at 1.2 then increasing  rapidly to today where total bilirubin is 13.1.  Interventional radiology has been consulted for consideration of percutaneous drain/MRCP.  Hopefully procedure can be completed on Tuesday, October 26, 2019.  Reason for admission as well as remaining inpatient through the weekend were discussed at length with patient and his wife. 3.  Anemia: Chronic and unchanged.  Monitor. 4.  Hypokalemia: Resolved.  Appreciate consult, will follow.   Patient expressed understanding and was in agreement with this plan. He also understands that He can call clinic at any time with any questions, concerns, or complaints.   Cancer Staging Hepatocellular carcinoma [facility]) Staging form: Liver, AJCC 8th Edition - Clinical stage from 07/10/2018: Stage IIIB (cT4, cN0, cM0) - Unsigned   Lloyd Huger, MD   10/24/2019 10:50 AM

## 2019-10-24 NOTE — Progress Notes (Signed)
PROGRESS NOTE    Chris Mcdonald   TKZ:[id number]  DOB: Mar 24, 1956  PCP: Donnie Coffin, MD    DOA: 10/22/2019 LOS: 2   Brief Narrative   Chris Mcdonald  is a 64 y.o. male with a known history of stage IIIB hepatocellular carcinoma on chemotherapy at the cancer center. Patient follows with Dr. Mike Gip.  Admitted on 10/22/19 for worsening LFT's and jaundice.  Patient reported symptoms of itchy head and arms after his chemotherapy, and breaking out in transient red spots that are not bothersome.  No fevers/chills, abdominal pain, nausea or vomiting.  He was seen at the cancer center on day of admission.  He'd had labs the day before on 10/21/2019 which showed AST 191, ALT 195, alkPhos 842, and Tbili 8.4.  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  Repeat labs on admission 10/22/19 revealed worsening liver function with Tbili 8.4>>11.3, AST 191>>221, ALT 195>>218.  Dr. Mike Gip discussed with GI who recommended MRCP to evaluate for obstruction and possible biliary drain placement.    MRCP Impression (summarized):  1. ... 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 2. Mild biliary ductal dilatation in the right lobe appears slightly increased in comparison to prior examination dated 10/07/2019 and as noted previously, gradually increased in comparison to multiple more remote prior examinations...    Assessment & Plan   Principal Problem:   Obstructive jaundice due to malignant neoplasm [facility]) Active Problems:   Hypokalemia   Elevated LFTs   Hyperbilirubinemia   Hepatocellular carcinoma (HCC)   Moderate protein-calorie malnutrition (HCC)   Essential hypertension   Gastroesophageal reflux disease without esophagitis   Obstructive jaundie  due to malignant neoplasm / Elevated LFTs / Hyperbilirubinemia - total bili up to 13 today. Present on admission, see narrative above.  IR consulted for evaluation and consideration of percutaneous biliary drain placement.  Discussed with on call radiologist on 7/3 who stated they can possibly place a left side hepatic drain, but right side not amenable due to tumor burden and high risk.  Monitor LFT's and INR daily.  Expect drain placement will be done Tuesday, due to holiday weekend.    Hypokalemia - POA with K 3.0, replaced.  Monitor daily and replace as needed.  Patient takes KCl 20 mEq daily at home.  Hepatocellular carcinoma, stage IIIb - diagnosed in Feb 2020, followed by Dr. Mike Gip, on chemotherapy s/p cycle #1 of nivolumab 9 days prior to admission.       Moderate protein-calorie malnutrition - dietician consulted.  Essential hypertension - hold home HCTZ as BP's are soft.  GERD - continue home PPI.  Muscle spasms - pt has chronic muscle spasms, worse with immobility.  PRN Robaxin.  Peripheral neuropathy - continue gabapentin.   Patient BMI: Body mass index is 17.09 kg/m.   DVT prophylaxis: Place and maintain sequential compression device Start: 10/24/19 0746 SCDs Start: 10/22/19 1652   Diet:  Diet Orders (From admission, onward)    Start     Ordered   10/22/19 1652  Diet Heart Room service appropriate? Yes; Fluid consistency: Thin  Diet effective now       Question Answer Comment  Room service appropriate? Yes   Fluid consistency: Thin      10/22/19 1651  Code Status: Full Code    Subjective 10/24/19    Patient seen this AM at bedside, daughter present.  He continues to have some intermittent nausea which she says is normal for him recently.  Denies any abdominal pain nausea vomiting or diarrhea.  We discussed radiology procedure being done on Tuesday and he is in agreement with that plan.   Disposition Plan & Communication   Status is:  Inpatient  Remains inpatient appropriate because:Inpatient level of care appropriate due to severity of illness with liver failure.   Dispo: The patient is from: Home              Anticipated d/c is to: Home              Anticipated d/c date is: 3 days              Patient currently is not medically stable to d/c.   Family Communication: daughter at bedside during encounter.    Consults, Procedures, Significant Events   Consultants:   Oncology  Interventional radiology  Procedures:   none  Antimicrobials:   none    Objective   Vitals:   10/23/19 1713 10/23/19 1925 10/23/19 2348 10/24/19 0731  BP: 113/79 120/78 114/69 104/84  Pulse: 65 66 71 68  Resp: 17 20 16 16   Temp: 98.7 F (37.1 C) 98.5 F (36.9 C) 98.4 F (36.9 C) 97.8 F (36.6 C)  TempSrc: Oral  Oral Oral  SpO2: 99% 100% 98% 99%  Weight:      Height:        Intake/Output Summary (Last 24 hours) at 10/24/2019 0747 Last data filed at 10/23/2019 1645 Gross per 24 hour  Intake 420 ml  Output --  Net 420 ml   Filed Weights   10/22/19 1851  Weight: 49.5 kg    Physical Exam:  General exam: awake, alert, no acute distress, cachectic HEENT: icteric sclera, moist mucus membranes, hearing grossly normal  Respiratory system: CTAB, no wheezes, rales or rhonchi, normal respiratory effort. Cardiovascular system: normal S1/S2, RRR, no pedal edema.   Gastrointestinal system: soft, NT, ND, +bowel sounds. Central nervous system: A&O x4. no gross focal neurologic deficits, normal speech Extremities: moves all, no edema, normal tone Skin: jaundice, dry, intact, normal temperature Psychiatry: normal mood, congruent affect, judgement and insight appear normal  Labs   Data Reviewed: I have personally reviewed following labs and imaging studies  CBC: Recent Labs  Lab 10/24/19 0609  WBC 5.1  HGB 10.8*  HCT 29.4*  MCV 88.8  PLT 540   Basic Metabolic Panel: Recent Labs  Lab 10/21/19 1013 10/22/19 1126  10/23/19 0611 10/24/19 0609  NA 133* 135 134* 135  K 3.1* 3.0* 3.8 3.7  CL 100 99 102 102  CO2 23 25 25 26   GLUCOSE 115* 92 93 91  BUN 20 18 18 11   CREATININE 0.64 0.68 0.61 0.51*  CALCIUM 9.0 9.4 9.1 8.9  MG  --   --   --  1.9   GFR: Estimated Creatinine Clearance: 67.9 mL/min (A) (by C-G formula based on SCr of 0.51 mg/dL (L)). Liver Function Tests: Recent Labs  Lab 10/21/19 1013 10/22/19 1126 10/23/19 0611 10/24/19 0609  AST 191* 221* 188* 170*  ALT 195* 218* 178* 168*  ALKPHOS 842* 970* 794* 773*  BILITOT 8.4* 11.3* 12.9* 13.1*  PROT 7.5 7.7 6.4* 6.2*  ALBUMIN 3.4* 3.4* 2.8* 2.6*   No results for input(s): LIPASE, AMYLASE in the last 168 hours. No  results for input(s): AMMONIA in the last 168 hours. Coagulation Profile: Recent Labs  Lab 10/22/19 1126 10/24/19 0609  INR 1.0 1.0   Cardiac Enzymes: No results for input(s): CKTOTAL, CKMB, CKMBINDEX, TROPONINI in the last 168 hours. BNP (last 3 results) No results for input(s): PROBNP in the last 8760 hours. HbA1C: No results for input(s): HGBA1C in the last 72 hours. CBG: No results for input(s): GLUCAP in the last 168 hours. Lipid Profile: No results for input(s): CHOL, HDL, LDLCALC, TRIG, CHOLHDL, LDLDIRECT in the last 72 hours. Thyroid Function Tests: No results for input(s): TSH, T4TOTAL, FREET4, T3FREE, THYROIDAB in the last 72 hours. Anemia Panel: No results for input(s): VITAMINB12, FOLATE, FERRITIN, TIBC, IRON, RETICCTPCT in the last 72 hours. Sepsis Labs: No results for input(s): PROCALCITON, LATICACIDVEN in the last 168 hours.  No results found for this or any previous visit (from the past 240 hour(s)).    Imaging Studies   MR ABDOMEN MRCP W WO CONTAST  Result Date: 10/22/2019 CLINICAL DATA:  Hepatitis C, multifocal HCC, worsening liver enzymes and elevated bilirubin, chemotherapy EXAM: MRI ABDOMEN WITHOUT AND WITH CONTRAST (INCLUDING MRCP) TECHNIQUE: Multiplanar multisequence MR imaging of the  abdomen was performed both before and after the administration of intravenous contrast. Heavily T2-weighted images of the biliary and pancreatic ducts were obtained, and three-dimensional MRCP images were rendered by post processing. CONTRAST:  86mL GADAVIST GADOBUTROL 1 MMOL/ML IV SOLN COMPARISON:  MR abdomen, 10/07/2019, 08/26/2019, 06/15/2019 FINDINGS: Lower chest: No acute findings. Hepatobiliary: No significant interval change in a large, branching, hypoenhancing mass of the right lobe of the liver, with extensive associated atrophy of the right lobe. The dominant component in the central right lobe of the liver extends into and occludes the central right portal vein. The central bulk of the mass measures approximately 4.2 x 3.2 cm, not significantly changed in size and appearance compared to prior examination when measured similarly (series 20, image 25). Unchanged additional component in the dome of the liver measuring 3.2 x 2.3 cm (series 20, image 10). These demonstrate subtle arterial rim hyperenhancement (series 18, image 26, 10) and there are again multiple small foci of transient arterial hyperenhancement of the right lobe (e.g. Series 18, image 29). Compensatory hypertrophy of the left lobe of the liver. Degree of biliary ductal dilatation in the right lobe appears slightly increased in comparison to prior examination and as noted previously, is gradually increased in comparison multiple more remote prior examinations. Mild biliary ductal dilatation in the left lobe is not significantly changed in comparison to most recent prior examination dated 10/07/2019 but clearly new when compared to more remote prior examinations. MRCP is severely degraded by motion artifact. Pancreas: No mass, inflammatory changes, or other parenchymal abnormality identified. Spleen:  Within normal limits in size and appearance. Adrenals/Urinary Tract: No masses identified. No evidence of hydronephrosis. Stomach/Bowel:  Visualized portions within the abdomen are unremarkable. Vascular/Lymphatic: No pathologically enlarged lymph nodes identified. No abdominal aortic aneurysm demonstrated. Other:  None. Musculoskeletal: No suspicious bone lesions identified. IMPRESSION: 1. No significant interval change in a large, branching, rim hyperenhancing mass of the right lobe of the liver, with extensive associated atrophy of the right lobe of the liver. The dominant component in the central right lobe of the liver extends into and occludes the central right portal vein, not significantly changed in size and appearance compared to prior examination when measured similarly. Unchanged additional component in the dome of the liver. 2. Mild biliary ductal dilatation in the right  lobe appears slightly increased in comparison to prior examination dated 10/07/2019 and as noted previously, gradually increased in comparison to multiple more remote prior examinations. Mild biliary ductal dilatation in the left lobe is not significantly changed in comparison to most recent prior examination dated 10/07/2019 but clearly new when compared to more remote prior examinations. Electronically Signed   By: Eddie Candle M.D.   On: 10/22/2019 19:21   US ABDOMEN LIMITED RUQ  Result Date: 10/22/2019 CLINICAL DATA:  Elevated liver enzymes EXAM: ULTRASOUND ABDOMEN LIMITED RIGHT UPPER QUADRANT COMPARISON:  Abdominal MR October 07 2019 FINDINGS: Gallbladder: No gallstones or wall thickening visualized. No evident pericholecystic fluid. No sonographic Murphy sign noted by sonographer. Common bile duct: Diameter: 2 mm. No extrahepatic biliary duct dilatation is evident. Central biliary duct prominence in the periportal region appear similar to MR. Liver: No focal lesion identified. Within normal limits in parenchymal echogenicity. Thrombus is seen within the portal vein, incompletely obstructing. Flow in the portal vein is in the anatomic direction. Other: None.  IMPRESSION: 1. 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 Flow in the portal vein is in the anatomic direction. 2. The apparent periportal mass seen on recent MR is not distinguishable by ultrasound. Note that there is central intrahepatic biliary duct dilatation in this region. There is no appreciable extrahepatic biliary duct dilatation. 3.  No gallbladder pathology demonstrable on this study. Electronically Signed   By: Lowella Grip III M.D.   On: 10/22/2019 10:27     Medications   Scheduled Meds: . feeding supplement  1 Container Oral Q24H  . feeding supplement (KATE FARMS STANDARD 1.4)  325 mL Oral BID BM  . gabapentin  100 mg Oral BID  . multivitamin with minerals  1 tablet Oral Daily  . sodium chloride flush  3 mL Intravenous Q12H   Continuous Infusions: . sodium chloride 10 mL/hr at 10/23/19 0500       LOS: 2 days    Time spent: 25 minutes    Ezekiel Slocumb, DO [facility]  10/24/2019, 7:47 AM    If 7PM-7AM, please contact night-coverage. How to contact the [facility] Attending or Consulting provider [place] or covering provider during after hours [place], for this patient?    1. Check the care team in [facility] and look for a) attending/consulting TRH provider listed and b) the [facility] team listed 2. Log into [email or web address] and use [place]'s universal password to access. If you do not have the password, please contact the hospital operator. 3. Locate the [facility] provider you are looking for under [facility] and page to a number that you can be directly reached. 4. If you still have difficulty reaching the provider, please page the [facility] (Director on Call) for the Hospitalists listed on amion for assistance.

## 2019-10-24 NOTE — Consult Note (Signed)
PHARMACY CONSULT NOTE - FOLLOW UP  Pharmacy Consult for Electrolyte Monitoring and Replacement   Recent Labs: Potassium (mmol/L)  Date Value  10/24/2019 3.7   Magnesium (mg/dL)  Date Value  10/24/2019 1.9   Calcium (mg/dL)  Date Value  10/24/2019 8.9   Albumin (g/dL)  Date Value  10/24/2019 2.6 (L)   Phosphorus (mg/dL)  Date Value  09/13/2019 3.5   Sodium (mmol/L)  Date Value  10/24/2019 135     Assessment: Pharmacy consulted to replace electrolytes. 64 y.o. male with a known history of stage IIIB hepatocellular carcinoma on chemotherapy at the cancer center. He had labs done on 10/21/2019 revealed anAST 191,ALT 195, alkaline phosphatase 842, and8.4.  Goal of Therapy:  WNL  Plan:  Electrolytes WNL this morning. No replacement needed at this time.   Pharmacy will F/u with AM labs and continue to replace as needed.   Oswald Hillock, PharmD, BCPS Clinical Pharmacist 10/24/2019 7:34 AM

## 2019-10-25 DIAGNOSIS — E440 Moderate protein-calorie malnutrition: Secondary | ICD-10-CM

## 2019-10-25 LAB — COMPREHENSIVE METABOLIC PANEL
ALT: 157 U/L — ABNORMAL HIGH (ref 0–44)
AST: 154 U/L — ABNORMAL HIGH (ref 15–41)
Albumin: 2.6 g/dL — ABNORMAL LOW (ref 3.5–5.0)
Alkaline Phosphatase: 728 U/L — ABNORMAL HIGH (ref 38–126)
Anion gap: 6 (ref 5–15)
BUN: 14 mg/dL (ref 8–23)
CO2: 25 mmol/L (ref 22–32)
Calcium: 8.6 mg/dL — ABNORMAL LOW (ref 8.9–10.3)
Chloride: 103 mmol/L (ref 98–111)
Creatinine, Ser: 0.37 mg/dL — ABNORMAL LOW (ref 0.61–1.24)
GFR calc Af Amer: >60 mL/min (ref >60)
GFR calc non Af Amer: >60 mL/min (ref >60)
Glucose, Bld: 95 mg/dL (ref 70–99)
Potassium: 3.6 mmol/L (ref 3.5–5.1)
Sodium: 134 mmol/L — ABNORMAL LOW (ref 135–145)
Total Bilirubin: 13.6 mg/dL — ABNORMAL HIGH (ref 0.3–1.2)
Total Protein: 6.2 g/dL — ABNORMAL LOW (ref 6.5–8.1)

## 2019-10-25 LAB — CBC
HCT: 28.5 % — ABNORMAL LOW (ref 39.0–52.0)
Hemoglobin: 10.4 g/dL — ABNORMAL LOW (ref 13.0–17.0)
MCH: 32.4 pg (ref 26.0–34.0)
MCHC: 36.5 g/dL — ABNORMAL HIGH (ref 30.0–36.0)
MCV: 88.8 fL (ref 80.0–100.0)
Platelets: 196 10*3/uL (ref 150–400)
RBC: 3.21 MIL/uL — ABNORMAL LOW (ref 4.22–5.81)
RDW: 19.4 % — ABNORMAL HIGH (ref 11.5–15.5)
WBC: 6.2 10*3/uL (ref 4.0–10.5)
nRBC: 0 % (ref 0.0–0.2)

## 2019-10-25 LAB — PROTIME-INR
INR: 1 (ref 0.8–1.2)
Prothrombin Time: 12.5 seconds (ref 11.4–15.2)

## 2019-10-25 LAB — MAGNESIUM: Magnesium: 2 mg/dL (ref 1.7–2.4)

## 2019-10-25 MED ORDER — ONDANSETRON HCL 4 MG/2ML IJ SOLN
4.0000 mg | INTRAMUSCULAR | Status: DC | PRN
  Administered 2019-10-26 – 2019-10-28 (×3): 4 mg via INTRAVENOUS
  Filled 2019-10-25 (×3): qty 2

## 2019-10-25 MED ORDER — POTASSIUM CHLORIDE CRYS ER 20 MEQ PO TBCR
20.0000 meq | EXTENDED_RELEASE_TABLET | Freq: Once | ORAL | Status: AC
  Administered 2019-10-25: 20 meq via ORAL

## 2019-10-25 MED ORDER — POTASSIUM CHLORIDE CRYS ER 20 MEQ PO TBCR
20.0000 meq | EXTENDED_RELEASE_TABLET | Freq: Once | ORAL | Status: DC
  Filled 2019-10-25 (×2): qty 1

## 2019-10-25 MED ORDER — PROMETHAZINE HCL 25 MG/ML IJ SOLN: 12.5000 mg | Freq: Four times a day (QID) | INTRAMUSCULAR | Status: DC | PRN

## 2019-10-25 NOTE — Progress Notes (Signed)
PROGRESS NOTE    Chris Mcdonald   VZC:[id number]  DOB: [birth year]  PCP: Donnie Coffin, MD    DOA: 10/22/2019 LOS: 3   Brief Narrative   Chris Mcdonald  is a 64 y.o. male with a known history of stage IIIB hepatocellular carcinoma on chemotherapy at the cancer center. Patient follows with Dr. Mike Gip.  Admitted on 10/22/19 for worsening LFT's and jaundice.  Patient reported symptoms of itchy head and arms after his chemotherapy, and breaking out in transient red spots that are not bothersome.  No fevers/chills, abdominal pain, nausea or vomiting.  He was seen at the cancer center on day of admission.  He'd had labs the day before on 10/21/2019 which showed AST 191, ALT 195, alkPhos 842, and Tbili 8.4.  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  Repeat labs on admission 10/22/19 revealed worsening liver function with Tbili 8.4>>11.3, AST 191>>221, ALT 195>>218.  Dr. Mike Gip discussed with GI who recommended MRCP to evaluate for obstruction and possible biliary drain placement.    MRCP Impression (summarized):  1. ... 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 2. Mild biliary ductal dilatation in the right lobe appears slightly increased in comparison to prior examination dated 10/07/2019 and as noted previously, gradually increased in comparison to multiple more remote prior examinations...    Assessment & Plan   Principal Problem:   Obstructive jaundice due to malignant neoplasm [facility]) Active Problems:   Hypokalemia   Elevated LFTs   Hyperbilirubinemia   Hepatocellular carcinoma (HCC)   Moderate protein-calorie malnutrition (HCC)   Essential hypertension   Gastroesophageal reflux disease without esophagitis   Obstructive jaundice  due to malignant neoplasm / Elevated LFTs / Hyperbilirubinemia - total bili 13.6 today (from 13.1). Present on admission, see narrative above.  IR consulted for evaluation and consideration of percutaneous biliary drain placement.  Discussed with on call radiologist on 7/3 who stated they can possibly place a left side hepatic drain, but right side not amenable due to tumor burden and high risk.  Monitor LFT's and INR daily.  Expect drain placement will be done Tuesday, due to holiday weekend.    Hypokalemia - POA with K 3.0, replaced.  Monitor daily and replace as needed.  Patient takes KCl 20 mEq daily at home.  Hepatocellular carcinoma, stage IIIb - diagnosed in Feb 2020, followed by Dr. Mike Gip, on chemotherapy s/p cycle #1 of nivolumab 9 days prior to admission.       Moderate protein-calorie malnutrition - dietician consulted.  Essential hypertension - hold home HCTZ as BP's are soft.  GERD - continue home PPI.  Muscle spasms - pt has chronic muscle spasms, worse with immobility.  PRN Robaxin.  Peripheral neuropathy - continue gabapentin.   Patient BMI: Body mass index is 17.09 kg/m.   DVT prophylaxis: Place and maintain sequential compression device Start: 10/24/19 0746 SCDs Start: 10/22/19 1652   Diet:  Diet Orders (From admission, onward)    Start     Ordered   10/22/19 1652  Diet Heart Room service appropriate? Yes; Fluid consistency: Thin  Diet effective now       Question Answer Comment  Room service appropriate? Yes   Fluid consistency: Thin      10/22/19 1651  Code Status: Full Code    Subjective 10/25/19    Patient seen this AM at bedside, wife present.  He vomited after taking potassium earlier, continues to be nauseated and with dry heaving.  Has chronic intermittent nausea recently.  Today he says he has some diffuse abdominal pain, unable to characterize it.   He and wife ask what time procedure will be done tomorrow.   Disposition Plan &  Communication   Status is: Inpatient  Remains inpatient appropriate because:Inpatient level of care appropriate due to severity of illness with liver failure.  Drain placement scheduled for 7/6, then requires in hospital monitoring to follow LFT's   Dispo: The patient is from: Home              Anticipated d/c is to: Home              Anticipated d/c date is: 2 days              Patient currently is not medically stable to d/c.   Family Communication: wife at bedside during encounter.    Consults, Procedures, Significant Events   Consultants:   Oncology  Interventional radiology  Procedures:   none  Antimicrobials:   none    Objective   Vitals:   10/24/19 1941 10/24/19 2322 10/24/19 2322 10/25/19 0335  BP: 106/76 102/78 102/78 132/76  Pulse: 63 62 60 75  Resp: 18 20 20 18   Temp: 98.6 F (37 C) 98.2 F (36.8 C) 98.2 F (36.8 C) 98.4 F (36.9 C)  TempSrc:    Oral  SpO2: 98% 97% 97% 97%  Weight:      Height:       No intake or output data in the 24 hours ending 10/25/19 0752 Filed Weights   10/22/19 1851  Weight: 49.5 kg    Physical Exam:  General exam: awake, alert, no acute distress, cachectic HEENT: icteric sclera, moist mucus membranes, hearing grossly normal  Respiratory system: CTAB, no wheezes, rales or rhonchi, normal respiratory effort. Cardiovascular system: normal S1/S2, RRR, no pedal edema.   Gastrointestinal system: soft, NT, ND, +bowel sounds. Central nervous system: A&O x4. no gross focal neurologic deficits, normal speech Extremities: moves all, no edema, normal tone Skin: jaundice, dry, intact, normal temperature Psychiatry: normal mood, congruent affect, judgement and insight appear normal  Labs   Data Reviewed: I have personally reviewed following labs and imaging studies  CBC: Recent Labs  Lab 10/24/19 0609 10/25/19 0543  WBC 5.1 6.2  HGB 10.8* 10.4*  HCT 29.4* 28.5*  MCV 88.8 88.8  PLT 184 801   Basic Metabolic  Panel: Recent Labs  Lab 10/21/19 1013 10/22/19 1126 10/23/19 0611 10/24/19 0609 10/25/19 0543  NA 133* 135 134* 135 134*  K 3.1* 3.0* 3.8 3.7 3.6  CL 100 99 102 102 103  CO2 23 25 25 26 25   GLUCOSE 115* 92 93 91 95  BUN 20 18 18 11 14   CREATININE 0.64 0.68 0.61 0.51* 0.37*  CALCIUM 9.0 9.4 9.1 8.9 8.6*  MG  --   --   --  1.9 2.0   GFR: Estimated Creatinine Clearance: 67.9 mL/min (A) (by C-G formula based on SCr of 0.37 mg/dL (L)). Liver Function Tests: Recent Labs  Lab 10/21/19 1013 10/22/19 1126 10/23/19 0611 10/24/19 0609 10/25/19 0543  AST 191* 221* 188* 170* 154*  ALT 195* 218* 178* 168* 157*  ALKPHOS 842* 970* 794* 773* 728*  BILITOT 8.4* 11.3* 12.9* 13.1* 13.6*  PROT  7.5 7.7 6.4* 6.2* 6.2*  ALBUMIN 3.4* 3.4* 2.8* 2.6* 2.6*   No results for input(s): LIPASE, AMYLASE in the last 168 hours. No results for input(s): AMMONIA in the last 168 hours. Coagulation Profile: Recent Labs  Lab 10/22/19 1126 10/24/19 0609 10/25/19 0543  INR 1.0 1.0 1.0   Cardiac Enzymes: No results for input(s): CKTOTAL, CKMB, CKMBINDEX, TROPONINI in the last 168 hours. BNP (last 3 results) No results for input(s): PROBNP in the last 8760 hours. HbA1C: No results for input(s): HGBA1C in the last 72 hours. CBG: No results for input(s): GLUCAP in the last 168 hours. Lipid Profile: No results for input(s): CHOL, HDL, LDLCALC, TRIG, CHOLHDL, LDLDIRECT in the last 72 hours. Thyroid Function Tests: No results for input(s): TSH, T4TOTAL, FREET4, T3FREE, THYROIDAB in the last 72 hours. Anemia Panel: No results for input(s): VITAMINB12, FOLATE, FERRITIN, TIBC, IRON, RETICCTPCT in the last 72 hours. Sepsis Labs: No results for input(s): PROCALCITON, LATICACIDVEN in the last 168 hours.  No results found for this or any previous visit (from the past 240 hour(s)).    Imaging Studies   No results found.   Medications   Scheduled Meds: . feeding supplement  1 Container Oral Q24H  .  feeding supplement (KATE FARMS STANDARD 1.4)  325 mL Oral BID BM  . gabapentin  100 mg Oral BID  . multivitamin with minerals  1 tablet Oral Daily  . sodium chloride flush  3 mL Intravenous Q12H   Continuous Infusions: . sodium chloride 10 mL/hr at 10/23/19 0500       LOS: 3 days    Time spent: 25 minutes    Ezekiel Slocumb, DO [facility]  10/25/2019, 7:52 AM    If 7PM-7AM, please contact night-coverage. How to contact the [facility] Attending or Consulting provider [place] or covering provider during after hours [place], for this patient?    1. Check the care team in [facility] and look for a) attending/consulting TRH provider listed and b) the [facility] team listed 2. Log into [email or web address] and use [place]'s universal password to access. If you do not have the password, please contact the hospital operator. 3. Locate the [facility] provider you are looking for under [facility] and page to a number that you can be directly reached. 4. If you still have difficulty reaching the provider, please page the [facility] (Director on Call) for the Hospitalists listed on amion for assistance.

## 2019-10-25 NOTE — Consult Note (Signed)
PHARMACY CONSULT NOTE - FOLLOW UP  Pharmacy Consult for Electrolyte Monitoring and Replacement   Recent Labs: Potassium (mmol/L)  Date Value  10/25/2019 3.6   Magnesium (mg/dL)  Date Value  10/25/2019 2.0   Calcium (mg/dL)  Date Value  10/25/2019 8.6 (L)   Albumin (g/dL)  Date Value  10/25/2019 2.6 (L)   Phosphorus (mg/dL)  Date Value  09/13/2019 3.5   Sodium (mmol/L)  Date Value  10/25/2019 134 (L)     Assessment: Pharmacy consulted to replace electrolytes. 64 y.o. male with a known history of stage IIIB hepatocellular carcinoma on chemotherapy at the cancer center. He had labs done on 10/21/2019 revealed anAST 191,ALT 195, alkaline phosphatase 842, and8.4.  Goal of Therapy:  WNL  Plan:  K 3.6 Mag 2.0  Scr 0.37 Will order KCL 20 meq po x 1, for goal K closer to 4.0 and with downward trend.  Pharmacy will F/u with AM labs and continue to replace as needed.   Noralee Space, PharmD, BCPS Clinical Pharmacist 10/25/2019 8:34 AM

## 2019-10-26 ENCOUNTER — Inpatient Hospital Stay: Payer: BC Managed Care – PPO

## 2019-10-26 ENCOUNTER — Encounter: Payer: Self-pay | Admitting: Internal Medicine

## 2019-10-26 DIAGNOSIS — K831 Obstruction of bile duct: Principal | ICD-10-CM

## 2019-10-26 DIAGNOSIS — C801 Malignant (primary) neoplasm, unspecified: Secondary | ICD-10-CM

## 2019-10-26 LAB — CBC
HCT: 29 % — ABNORMAL LOW (ref 39.0–52.0)
Hemoglobin: 10.1 g/dL — ABNORMAL LOW (ref 13.0–17.0)
MCH: 32 pg (ref 26.0–34.0)
MCHC: 34.8 g/dL (ref 30.0–36.0)
MCV: 91.8 fL (ref 80.0–100.0)
Platelets: 209 10*3/uL (ref 150–400)
RBC: 3.16 MIL/uL — ABNORMAL LOW (ref 4.22–5.81)
RDW: 19.7 % — ABNORMAL HIGH (ref 11.5–15.5)
WBC: 6.8 10*3/uL (ref 4.0–10.5)
nRBC: 0 % (ref 0.0–0.2)

## 2019-10-26 LAB — COMPREHENSIVE METABOLIC PANEL
ALT: 169 U/L — ABNORMAL HIGH (ref 0–44)
AST: 198 U/L — ABNORMAL HIGH (ref 15–41)
Albumin: 2.5 g/dL — ABNORMAL LOW (ref 3.5–5.0)
Alkaline Phosphatase: 751 U/L — ABNORMAL HIGH (ref 38–126)
Anion gap: 7 (ref 5–15)
BUN: 10 mg/dL (ref 8–23)
CO2: 23 mmol/L (ref 22–32)
Calcium: 8.7 mg/dL — ABNORMAL LOW (ref 8.9–10.3)
Chloride: 102 mmol/L (ref 98–111)
Creatinine, Ser: 0.31 mg/dL — ABNORMAL LOW (ref 0.61–1.24)
GFR calc Af Amer: >60 mL/min (ref >60)
GFR calc non Af Amer: >60 mL/min (ref >60)
Glucose, Bld: 91 mg/dL (ref 70–99)
Potassium: 3.9 mmol/L (ref 3.5–5.1)
Sodium: 132 mmol/L — ABNORMAL LOW (ref 135–145)
Total Bilirubin: 14.3 mg/dL — ABNORMAL HIGH (ref 0.3–1.2)
Total Protein: 6.2 g/dL — ABNORMAL LOW (ref 6.5–8.1)

## 2019-10-26 LAB — SARS CORONAVIRUS 2 BY RT PCR (HOSPITAL ORDER, PERFORMED IN ~~LOC~~ HOSPITAL LAB): SARS Coronavirus 2: NEGATIVE

## 2019-10-26 LAB — PROTIME-INR
INR: 1 (ref 0.8–1.2)
Prothrombin Time: 12.6 seconds (ref 11.4–15.2)

## 2019-10-26 MED ORDER — IOHEXOL 300 MG/ML  SOLN
75.0000 mL | Freq: Once | INTRAMUSCULAR | Status: AC | PRN
  Administered 2019-10-26: 75 mL via INTRAVENOUS

## 2019-10-26 NOTE — Consult Note (Signed)
PHARMACY CONSULT NOTE - FOLLOW UP  Pharmacy Consult for Electrolyte Monitoring and Replacement   Recent Labs: Potassium (mmol/L)  Date Value  10/26/2019 3.9   Magnesium (mg/dL)  Date Value  10/25/2019 2.0   Calcium (mg/dL)  Date Value  10/26/2019 8.7 (L)   Albumin (g/dL)  Date Value  10/26/2019 2.5 (L)   Phosphorus (mg/dL)  Date Value  09/13/2019 3.5   Sodium (mmol/L)  Date Value  10/26/2019 132 (L)     Assessment: Pharmacy consulted to replace electrolytes. 64 y.o. male with a known history of stage IIIB hepatocellular carcinoma on chemotherapy at the cancer center. He had labs done on 10/21/2019 revealed anAST 191,ALT 195, alkaline phosphatase 842, and8.4.  Goal of Therapy:  WNL  Plan:  No replacement needed  this morning.   Pharmacy will F/u with AM labs and continue to replace as needed.   Pernell Dupre, PharmD, BCPS Clinical Pharmacist 10/26/2019 7:42 AM

## 2019-10-26 NOTE — Progress Notes (Signed)
[facility] Hematology/Oncology Progress Note  Date of admission: 10/22/2019  Hospital day:  10/26/2019  Chief Complaint: Chris Mcdonald is a 64 y.o. male with multifocal stage IIIC hepatocellular carcinoma who was admitted through the oncology clinic with a rapidly rising bilirubin.  Subjective:  Patient denies any pain.  He wishes to pursue percutaneous biliary drain placement tomorrow.  Social History: The patient is alone today.  Allergies: No Known Allergies  Scheduled Medications: . feeding supplement  1 Container Oral Q24H  . feeding supplement (KATE FARMS STANDARD 1.4)  325 mL Oral BID BM  . gabapentin  100 mg Oral BID  . multivitamin with minerals  1 tablet Oral Daily  . potassium chloride  20 mEq Oral Once  . sodium chloride flush  3 mL Intravenous Q12H    Review of Systems: GENERAL:  Fatigued.  No fevers, sweats or weight loss. PERFORMANCE STATUS (ECOG):  2 HEENT:  No visual changes, runny nose, sore throat, mouth sores or tenderness. Lungs: No shortness of breath or cough.  No hemoptysis. Cardiac:  No chest pain, palpitations, orthopnea, or PND. GI:  Decreased appetite.  Nausea, vomiting.  No diarrhea, constipation, melena or hematochezia. GU:  Dark colored urine.  No urgency, frequency, or dysuria. Musculoskeletal:  No back pain.  No joint pain.  No muscle tenderness. Extremities:  No pain or swelling. Skin:  Jaundiced.  Pruritus. Neuro:  General weakness.  No headache, numbness or weakness, balance or coordination issues. Endocrine:  No diabetes, thyroid issues, hot flashes or night sweats. Psych:  No mood changes, depression or anxiety. Pain:  No focal pain. Review of systems:  All other systems reviewed and found to be negative.  Physical Exam: Blood pressure 108/70, pulse 73, temperature 99.9 F (37.7 C), resp. rate 16, height 5\' 7"  (1.702 m), weight 109 lb 2 oz (49.5 kg), SpO2 97 %.  GENERAL:  Cachectic chronically ill appearing gentleman  sitting comfortably on the medical unit in no acute distress. MENTAL STATUS:  Alert and oriented to person, place and time. HEAD:  Short thin brown hair.  Temporal wasting.  Normocephalic, atraumatic, face symmetric, no Cushingoid features. EYES:  Brown eyes.  Pupils equal round and reactive to light and accomodation.  Scleral icterus. ENT:  Oropharynx clear without lesion.  Tongue normal. Mucous membranes moist.  RESPIRATORY:  Clear to auscultation without rales, wheezes or rhonchi. CARDIOVASCULAR:  Regular rate and rhythm without murmur, rub or gallop. ABDOMEN:  Soft, non-tender, with active bowel sounds, and no hepatosplenomegaly.  No masses. SKIN:  Jaundiced.  Multiple excoriations. EXTREMITIES: No edema, no skin discoloration or tenderness.  No palpable cords. LYMPH NODES: No palpable cervical, supraclavicular, axillary or inguinal adenopathy  NEUROLOGICAL: Unremarkable. PSYCH:  Appropriate.   Results for orders placed or performed during the hospital encounter of 10/22/19 (from the past 48 hour(s))  Comprehensive metabolic panel     Status: Abnormal   Collection Time: 10/25/19  5:43 AM  Result Value Ref Range   Sodium 134 (L) 135 - 145 mmol/L   Potassium 3.6 3.5 - 5.1 mmol/L   Chloride 103 98 - 111 mmol/L   CO2 25 22 - 32 mmol/L   Glucose, Bld 95 70 - 99 mg/dL    Comment: Glucose reference range applies only to samples taken after fasting for at least 8 hours.   BUN 14 8 - 23 mg/dL   Creatinine, Ser 0.37 (L) 0.61 - 1.24 mg/dL   Calcium 8.6 (L) 8.9 - 10.3 mg/dL   Total  Protein 6.2 (L) 6.5 - 8.1 g/dL   Albumin 2.6 (L) 3.5 - 5.0 g/dL   AST 154 (H) 15 - 41 U/L   ALT 157 (H) 0 - 44 U/L   Alkaline Phosphatase 728 (H) 38 - 126 U/L   Total Bilirubin 13.6 (H) 0.3 - 1.2 mg/dL   GFR calc non Af Amer >60 >60 mL/min   GFR calc Af Amer >60 >60 mL/min   Anion gap 6 5 - 15    Comment: Performed at [facility], [address] [place], [place] [address]  Protime-INR     Status:  None   Collection Time: 10/25/19  5:43 AM  Result Value Ref Range   Prothrombin Time 12.5 11.4 - 15.2 seconds   INR 1.0 0.8 - 1.2    Comment: (NOTE) INR goal varies based on device and disease states. Performed at [facility], [address] [place], [place] [address]   Magnesium     Status: None   Collection Time: 10/25/19  5:43 AM  Result Value Ref Range   Magnesium 2.0 1.7 - 2.4 mg/dL    Comment: Performed at [facility], [address] [place], [place] [address]  CBC     Status: Abnormal   Collection Time: 10/25/19  5:43 AM  Result Value Ref Range   WBC 6.2 4.0 - 10.5 K/uL   RBC 3.21 (L) 4.22 - 5.81 MIL/uL   Hemoglobin 10.4 (L) 13.0 - 17.0 g/dL   HCT 28.5 (L) 39 - 52 %   MCV 88.8 80.0 - 100.0 fL   MCH 32.4 26.0 - 34.0 pg   MCHC 36.5 (H) 30.0 - 36.0 g/dL   RDW 19.4 (H) 11.5 - 15.5 %   Platelets 196 150 - 400 K/uL   nRBC 0.0 0.0 - 0.2 %    Comment: Performed at [facility], [address] [place], [place] [address]  Comprehensive metabolic panel     Status: Abnormal   Collection Time: 10/26/19  4:32 AM  Result Value Ref Range   Sodium 132 (L) 135 - 145 mmol/L   Potassium 3.9 3.5 - 5.1 mmol/L   Chloride 102 98 - 111 mmol/L   CO2 23 22 - 32 mmol/L   Glucose, Bld 91 70 - 99 mg/dL    Comment: Glucose reference range applies only to samples taken after fasting for at least 8 hours.   BUN 10 8 - 23 mg/dL   Creatinine, Ser 0.31 (L) 0.61 - 1.24 mg/dL   Calcium 8.7 (L) 8.9 - 10.3 mg/dL   Total Protein 6.2 (L) 6.5 - 8.1 g/dL   Albumin 2.5 (L) 3.5 - 5.0 g/dL   AST 198 (H) 15 - 41 U/L   ALT 169 (H) 0 - 44 U/L   Alkaline Phosphatase 751 (H) 38 - 126 U/L   Total Bilirubin 14.3 (H) 0.3 - 1.2 mg/dL   GFR calc non Af Amer >60 >60 mL/min   GFR calc Af Amer >60 >60 mL/min   Anion gap 7 5 - 15    Comment: Performed at [facility], [address] [place],  [address]  Protime-INR     Status: None   Collection Time: 10/26/19  4:32  AM  Result Value Ref Range   Prothrombin Time 12.6 11.4 - 15.2 seconds   INR 1.0 0.8 - 1.2    Comment: (NOTE) INR goal varies based on device and disease states. Performed at [facility], [address] [place], [place] [address]   CBC     Status: Abnormal   Collection Time: 10/26/19  4:32 AM  Result Value Ref Range   WBC 6.8 4.0 - 10.5 K/uL   RBC 3.16 (L) 4.22 - 5.81 MIL/uL   Hemoglobin 10.1 (L) 13.0 - 17.0 g/dL   HCT 29.0 (L) 39 - 52 %   MCV 91.8 80.0 - 100.0 fL   MCH 32.0 26.0 - 34.0 pg   MCHC 34.8 30.0 - 36.0 g/dL   RDW 19.7 (H) 11.5 - 15.5 %   Platelets 209 150 - 400 K/uL   nRBC 0.0 0.0 - 0.2 %    Comment: Performed at [facility], [address] [place], [place] [address]  SARS Coronavirus 2 by RT PCR (hospital order, performed in [facility] hospital lab) Nasopharyngeal Nasopharyngeal Swab     Status: None   Collection Time: 10/26/19  3:50 PM   Specimen: Nasopharyngeal Swab  Result Value Ref Range   SARS Coronavirus 2 NEGATIVE NEGATIVE    Comment: (NOTE) SARS-CoV-2 target nucleic acids are NOT DETECTED.  The SARS-CoV-2 RNA is generally detectable in upper and lower respiratory specimens during the acute phase of infection. The lowest concentration of SARS-CoV-2 viral copies this assay can detect is 250 copies / mL. A negative result does not preclude SARS-CoV-2 infection and should not be used as the sole basis for treatment or other patient management decisions.  A negative result may occur with improper specimen collection / handling, submission of specimen other than nasopharyngeal swab, presence of viral mutation(s) within the areas targeted by this assay, and inadequate number of viral copies (<250 copies / mL). A negative result must be combined with clinical observations, patient history, and epidemiological information.  Fact Sheet for Patients:   StrictlyIdeas.no  Fact Sheet for Healthcare  Providers: BankingDealers.co.za  This test is not yet approved or  cleared by the [place] FDA and has been authorized for detection and/or diagnosis of SARS-CoV-2 by FDA under an Emergency Use Authorization (EUA).  This EUA will remain in effect (meaning this test can be used) for the duration of the COVID-19 declaration under Section 564(b)(1) of the Act, 21 U.S.C. section 360bbb-3(b)(1), unless the authorization is terminated or revoked sooner.  Performed at [facility], [address] [place], [place] [address]    CT ABDOMEN W WO CONTRAST  Result Date: 10/26/2019 CLINICAL DATA:  Hepatobiliary cancer.  Restaging. EXAM: CT ABDOMEN WITHOUT AND WITH CONTRAST TECHNIQUE: Multidetector CT imaging of the abdomen was performed following the standard protocol before and following the bolus administration of intravenous contrast. CONTRAST:  11mL OMNIPAQUE IOHEXOL 300 MG/ML  SOLN COMPARISON:  MRI 10/22/2019. FINDINGS: Lower chest: Unremarkable. Hepatobiliary: Central right hepatic lesion seen on recent MRI is similar measuring approximately 4.2 x 2.8 cm today compared to 4.2 x 3.2 cm on recent MRI. Differential measurement likely related to better contrast resolution on previous MRI. As before, lesion occludes the right portal vein and projects into the portal vein lumen at the left and right portal vein bifurcation. Rim enhancing lesion in the dome of liver measures 3.0 x 3.0 cm today compared to 3.1 x 2.3 cm on recent MRI. Marked intrahepatic biliary duct dilatation again noted with enlargement of the lateral segment left liver and right hepatic atrophy. Middle and right hepatic veins are not well opacified and patency cannot be confirmed on this study. Gallbladder is nondistended with prominent varices in the gallbladder fossa. Pancreas: No focal mass lesion.  No dilatation of the main duct. No intraparenchymal cyst. No peripancreatic edema. Spleen: 11.7 cm  craniocaudal length.  No focal abnormality. Adrenals/Urinary Tract: No adrenal nodule or mass. Kidneys unremarkable. Stomach/Bowel: Stomach is unremarkable. No gastric wall thickening. No evidence of outlet obstruction. Duodenum is normally positioned as is the ligament of Treitz. No small bowel or colonic dilatation within the visualized abdomen. Vascular/Lymphatic: There is abdominal aortic atherosclerosis without aneurysm. Mild lymphadenopathy identified in the hepatoduodenal ligament with 12 mm short axis hepatoduodenal ligament lymph node identified on 34/5. 14 mm short axis retrocaval lymph node is visible on 44/5. Other: No substantial intraperitoneal free fluid. Musculoskeletal: No worrisome lytic or sclerotic osseous abnormality. IMPRESSION: 1. Central right hepatic lesion is similar in size in the interval since recent MRI 2 days ago. A second rim enhancing lesion in the dome of liver is also similar. 2. Stable appearance of the occlusion of the right portal vein and extension of tumor/thrombus into the portal vein lumen at the left and right portal vein bifurcation. 3. Marked intrahepatic biliary duct dilatation with enlargement of the lateral segment left liver and right hepatic atrophy. 4. Right and middle hepatic veins are not well opacified and patency cannot be confirmed. 5. Mild lymphadenopathy in the hepatoduodenal ligament and retrocaval space. Findings suspicious for metastatic disease. 6. Splenomegaly. Electronically Signed   By: Misty Stanley M.D.   On: 10/26/2019 10:22    Assessment:  Chris Mcdonald is a 64 y.o. male with multifocal stage IIIC hepatocellular carcinoma s/p lenvatinib and currently day 13 s/p cycle #1 nivolumab admitted with rising bilirubin.  Abdomen MRCP on 10/22/2019 revealed a large, branching, rim hyperenhancing mass of the right lobe of the liver, with extensive associated atrophy of the right lobe of the liver. The dominant component in the central right lobe of the  liver extends into and occludes the central right portal vein, not significantly changed insize and appearance compared to prior examination when measured similarly.  There was unchanged additional component in the dome of the liver.  There was mild biliary ductal dilatation in the right lobe appears slightly increased in comparison to prior examination dated 10/07/2019 and as noted previously, gradually increased in comparison to multiple more remote prior examinations. Mild biliary ductal dilatation in the left lobe is not significantly changed in comparison to most recent prior examination dated 10/07/2019 but clearly new when compared to more remote prior examinations.  Symptomatically, he is fatigued.  He has had some nausea and vomiting.  He has pruritus associated with hyperbilirubinemia.  Bilirubin is 14.3.  Plan: 1. Unresectable hepatocellular carcinoma  Imaging with Dr Allen Norris (gatroenterology) and Dr Sandi Mariscal (interventional radiology) was reviewed.  Patient is not amendable to an ERCP.  Discuss imaging with patient and general issues regarding percutaneous biliary drain placement.  Discuss risks vs benefit of left sided biliary drain placement.  Unclear if drain placement will result in improvement of bilirubin and symptoms as he also has disease involving the right lobe.  Risks reviewed including discomfort/pain of a catheter, biliary leakage and infection.  Discuss supportive care and Hospice (patient not interested).  Multiple questions asked and answered. 2. Code status  Patient interested in FULL code status. 3.   Disposition  Patient to remain hospitalized until stablized s/p drain placement.   Lequita Asal, MD  10/26/2019, 9:19 PM

## 2019-10-26 NOTE — Progress Notes (Signed)
PROGRESS NOTE    Chris Mcdonald   NFA:[id number]  DOB: 1955/04/25  PCP: Donnie Coffin, MD    DOA: 10/22/2019 LOS: 4   Brief Narrative   Chris Mcdonald  is a 64 y.o. male with a known history of stage IIIB hepatocellular carcinoma on chemotherapy at the cancer center. Patient follows with Dr. Mike Gip.  Admitted on 10/22/19 for worsening LFT's and jaundice.  Patient reported symptoms of itchy head and arms after his chemotherapy, and breaking out in transient red spots that are not bothersome.  No fevers/chills, abdominal pain, nausea or vomiting.  He was seen at the cancer center on day of admission.  He'd had labs the day before on 10/21/2019 which showed AST 191, ALT 195, alkPhos 842, and Tbili 8.4.  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  Repeat labs on admission 10/22/19 revealed worsening liver function with Tbili 8.4>>11.3, AST 191>>221, ALT 195>>218.  Dr. Mike Gip discussed with GI who recommended MRCP to evaluate for obstruction and possible biliary drain placement.    MRCP Impression (summarized):  1. ... 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 2. Mild biliary ductal dilatation in the right lobe appears slightly increased in comparison to prior examination dated 10/07/2019 and as noted previously, gradually increased in comparison to multiple more remote prior examinations...    Assessment & Plan   Principal Problem:   Obstructive jaundice due to malignant neoplasm [facility]) Active Problems:   Hypokalemia   Elevated LFTs   Hyperbilirubinemia   Hepatocellular carcinoma (HCC)   Moderate protein-calorie malnutrition (HCC)   Essential hypertension   Gastroesophageal reflux disease without esophagitis   Obstructive jaundice  due to malignant neoplasm / Elevated LFTs / Hyperbilirubinemia - total bili up to 14.3. Present on admission, see narrative above.  IR consulted for evaluation and consideration of percutaneous biliary drain placement.  Discussed with on call radiologist on 7/3 who stated they can possibly place a left side hepatic drain, but right side not amenable due to tumor burden and high risk.  GI is considering possibly placing stent by ERCP if feasible.  Monitor LFT's and INR daily.  Oncology also following.  Hypokalemia - POA with K 3.0, replaced.  Monitor daily and replace as needed.  Patient takes KCl 20 mEq daily at home.  Hepatocellular carcinoma, stage IIIb - diagnosed in Feb 2020, followed by Dr. Mike Gip, on chemotherapy s/p cycle #1 of nivolumab 9 days prior to admission.       Moderate protein-calorie malnutrition - dietician consulted.  Essential hypertension - hold home HCTZ as BP's are soft.  GERD - continue home PPI.  Muscle spasms - pt has chronic muscle spasms, worse with immobility.  PRN Robaxin.  Peripheral neuropathy - continue gabapentin.   Patient BMI: Body mass index is 17.09 kg/m.   DVT prophylaxis: Place and maintain sequential compression device Start: 10/24/19 0746 SCDs Start: 10/22/19 1652   Diet:  Diet Orders (From admission, onward)    Start     Ordered   10/26/19 0001  Diet NPO time specified  Diet effective midnight        10/25/19 1257            Code Status: Full Code    Subjective 10/26/19    Patient seen during  lunch today at bedside.  He had CT scan done and spoke with radiologist about the drain, probably having drain in place permanent vs possible stent procedure instead.  He is understanding and hopes GI can place stent instead.  He otherwise reports feeling well, denies any acute complaints.  No complaint of nausea today, says increased Zofran frequency has it controlled.   Disposition Plan & Communication   Status is: Inpatient  Remains  inpatient appropriate because:Inpatient level of care appropriate due to severity of illness with liver failure.  Drain placement scheduled for 7/6, then requires in hospital monitoring to follow LFT's   Dispo: The patient is from: Home              Anticipated d/c is to: Home              Anticipated d/c date is: 2 days              Patient currently is not medically stable to d/c.   Family Communication: none at bedside during encounter.  Spoke with wife at bedside past 2 days.  Will attempt to call her.   Consults, Procedures, Significant Events   Consultants:   Oncology  Interventional radiology  Procedures:   none  Antimicrobials:   none    Objective   Vitals:   10/25/19 1120 10/25/19 1641 10/25/19 1932 10/25/19 2324  BP: 114/81 115/73 101/63 109/68  Pulse: 71 68 71 69  Resp: 16 18 16 18   Temp: 98.9 F (37.2 C) 99 F (37.2 C) 98.9 F (37.2 C) 99 F (37.2 C)  TempSrc: Oral Oral Oral Oral  SpO2: 100% 100% 98% 98%  Weight:      Height:       No intake or output data in the 24 hours ending 10/26/19 0726 Filed Weights   10/22/19 1851  Weight: 49.5 kg    Physical Exam:  General exam: awake, alert, no acute distress, cachectic HEENT: icteric sclera, moist mucus membranes, hearing grossly normal  Respiratory system: CTAB, no wheezes, rales or rhonchi, normal respiratory effort. Cardiovascular system: normal S1/S2, RRR, no pedal edema.   Gastrointestinal system: soft, NT, ND, +bowel sounds. Central nervous system: A&O x4. no gross focal neurologic deficits, normal speech Extremities: moves all, no edema, normal tone Skin: jaundice, dry, intact, normal temperature Psychiatry: normal mood, congruent affect, judgement and insight appear normal  Labs   Data Reviewed: I have personally reviewed following labs and imaging studies  CBC: Recent Labs  Lab 10/24/19 0609 10/25/19 0543 10/26/19 0432  WBC 5.1 6.2 6.8  HGB 10.8* 10.4* 10.1*  HCT 29.4* 28.5*  29.0*  MCV 88.8 88.8 91.8  PLT 184 196 664   Basic Metabolic Panel: Recent Labs  Lab 10/22/19 1126 10/23/19 0611 10/24/19 0609 10/25/19 0543 10/26/19 0432  NA 135 134* 135 134* 132*  K 3.0* 3.8 3.7 3.6 3.9  CL 99 102 102 103 102  CO2 25 25 26 25 23   GLUCOSE 92 93 91 95 91  BUN 18 18 11 14 10   CREATININE 0.68 0.61 0.51* 0.37* 0.31*  CALCIUM 9.4 9.1 8.9 8.6* 8.7*  MG  --   --  1.9 2.0  --    GFR: Estimated Creatinine Clearance: 67.9 mL/min (A) (by C-G formula based on SCr of 0.31 mg/dL (L)). Liver Function Tests: Recent Labs  Lab 10/22/19 1126 10/23/19 0611 10/24/19 0609 10/25/19 0543 10/26/19 0432  AST 221* 188* 170* 154* 198*  ALT 218* 178* 168* 157* 169*  ALKPHOS 970* 794* 773* 728* 751*  BILITOT 11.3* 12.9* 13.1* 13.6* 14.3*  PROT 7.7 6.4* 6.2* 6.2* 6.2*  ALBUMIN 3.4* 2.8* 2.6* 2.6* 2.5*   No results for input(s): LIPASE, AMYLASE in the last 168 hours. No results for input(s): AMMONIA in the last 168 hours. Coagulation Profile: Recent Labs  Lab 10/22/19 1126 10/24/19 0609 10/25/19 0543 10/26/19 0432  INR 1.0 1.0 1.0 1.0   Cardiac Enzymes: No results for input(s): CKTOTAL, CKMB, CKMBINDEX, TROPONINI in the last 168 hours. BNP (last 3 results) No results for input(s): PROBNP in the last 8760 hours. HbA1C: No results for input(s): HGBA1C in the last 72 hours. CBG: No results for input(s): GLUCAP in the last 168 hours. Lipid Profile: No results for input(s): CHOL, HDL, LDLCALC, TRIG, CHOLHDL, LDLDIRECT in the last 72 hours. Thyroid Function Tests: No results for input(s): TSH, T4TOTAL, FREET4, T3FREE, THYROIDAB in the last 72 hours. Anemia Panel: No results for input(s): VITAMINB12, FOLATE, FERRITIN, TIBC, IRON, RETICCTPCT in the last 72 hours. Sepsis Labs: No results for input(s): PROCALCITON, LATICACIDVEN in the last 168 hours.  No results found for this or any previous visit (from the past 240 hour(s)).    Imaging Studies   No results  found.   Medications   Scheduled Meds: . feeding supplement  1 Container Oral Q24H  . feeding supplement ([employer] STANDARD 1.4)  325 mL Oral BID BM  . gabapentin  100 mg Oral BID  . multivitamin with minerals  1 tablet Oral Daily  . potassium chloride  20 mEq Oral Once  . sodium chloride flush  3 mL Intravenous Q12H   Continuous Infusions: . sodium chloride 10 mL/hr at 10/23/19 0500       LOS: 4 days    Time spent: 25 minutes    Ezekiel Slocumb, DO [facility]  10/26/2019, 7:26 AM    If 7PM-7AM, please contact night-coverage. How to contact the [facility] Attending or Consulting provider [place] or covering provider during after hours [place], for this patient?    1. Check the care team in [facility] and look for a) attending/consulting [facility] provider listed and b) the [facility] team listed 2. Log into [email or web address] and use [place]'s universal password to access. If you do not have the password, please contact the hospital operator. 3. Locate the [facility] provider you are looking for under [facility] and page to a number that you can be directly reached. 4. If you still have difficulty reaching the provider, please page the [facility] (Director on Call) for the Hospitalists listed on amion for assistance.

## 2019-10-26 NOTE — Consult Note (Signed)
Chief Complaint: Chris Mcdonald, worsening LFTs  Referring Physician(s): Corcoran (Oncology) Wohl (GI)  Patient Status: [facility] - In-pt  History of Present Illness: Chris Mcdonald is a 64 y.o. male with past medical history significant for hypertension stroke, hepatitis and multifocal hepatocellular carcinoma, admitted with worsening LFTs found to have slight progression of intrahepatic biliary ductal dilatation on MRCP performed 10/22/2019.  Request made for consideration of internal/external biliary drainage catheter placement.  The patient was seen and evaluated in the CT department following acquisition of a dedicated cirrhotic protocol CT scan of the abdomen, obtained for potential procedural planning purposes.  Patient complains of yellowing of his skin and eyes as well as diffuse itching.    He Is otherwise without complaint.,  No abdominal girth, no change in appetite or energy level breath, fever or chills.  Past Medical History:  Diagnosis Date  . Arthritis   . Hepatitis   . Hypertension   . Liver cancer ([place])   . Spinal stenosis   . Stroke [facility])     Past Surgical History:  Procedure Laterality Date  . COLONOSCOPY WITH PROPOFOL N/A 02/21/2015   Procedure: COLONOSCOPY WITH PROPOFOL;  Surgeon: Lollie Sails, MD;  Location: [facility] ENDOSCOPY;  Service: Endoscopy;  Laterality: N/A;  . INGUINAL HERNIA REPAIR Bilateral   . IR RADIOLOGIST EVAL & MGMT  08/04/2018  . POSTERIOR CERVICAL VERTEBRAE EXCISION      Allergies: Patient has no known allergies.  Medications: Prior to Admission medications   Medication Sig Start Date End Date Taking? Authorizing Provider  aspirin EC 81 MG tablet Take 81 mg by mouth daily.    Yes [provider]  hydrochlorothiazide (HYDRODIURIL) 12.5 MG tablet Take 25 mg by mouth daily.    Yes [provider]  ondansetron (ZOFRAN) 8 MG tablet Take 1 tablet (8 mg total) by mouth every 8 (eight) hours as needed for nausea or vomiting.  10/14/19  Yes Corcoran, Melissa C, MD  potassium chloride SA (KLOR-CON M20) 20 MEQ tablet TAKE 1 TABLET (20 MEQ TOTAL) BY MOUTH DAILY. AS DIRECTED BY MD Patient taking differently: Take 20 mEq by mouth daily. TAKE 1 TABLET (20 MEQ TOTAL) BY MOUTH DAILY. AS DIRECTED BY MD 10/14/19  Yes Lequita Asal, MD  cabozantinib (CABOMETYX) 20 MG tablet Take 2 tablets (40 mg total) by mouth daily. Take on an empty stomach, 1 hour before or 2 hours after meals. Patient not taking: Reported on 10/22/2019 09/01/19   Lequita Asal, MD  gabapentin (NEURONTIN) 100 MG capsule Take 1 capsule (100 mg total) by mouth 2 (two) times daily. 08/08/19   Loletha Grayer, MD     Family History  Problem Relation Age of Onset  . Cancer Brother   . Heart disease Brother   . Cancer Mother     Social History   Socioeconomic History  . Marital status: Married    Spouse name: Not on file  . Number of children: Not on file  . Years of education: Not on file  . Highest education level: Not on file  Occupational History  . Not on file  Tobacco Use  . Smoking status: Current Every Day Smoker    Packs/day: 1.50    Years: 50.00    Pack years: 75.00    Types: Cigarettes  . Smokeless tobacco: Never Used  Vaping Use  . Vaping Use: Never used  Substance and Sexual Activity  . Alcohol use: No  . Drug use: No  . Sexual activity: Not Currently  Other Topics Concern  . Not on file  Social History Narrative  . Not on file   Social Determinants of Health   Financial Resource Strain:   . Difficulty of Paying Living Expenses:   Food Insecurity:   . Worried About Charity fundraiser in the Last Year:   . Arboriculturist in the Last Year:   Transportation Needs:   . Film/video editor (Medical):   Marland Kitchen Lack of Transportation (Non-Medical):   Physical Activity:   . Days of Exercise per Week:   . Minutes of Exercise per Session:   Stress:   . Feeling of Stress :   Social Connections:   . Frequency of  Communication with Friends and Family:   . Frequency of Social Gatherings with Friends and Family:   . Attends Religious Services:   . Active Member of Clubs or Organizations:   . Attends Archivist Meetings:   Marland Kitchen Marital Status:     ECOG Status: 1 - Symptomatic but completely ambulatory  Review of Systems: A 12 point ROS discussed and pertinent positives are indicated in the HPI above.  All other systems are negative.  Review of Systems  Vital Signs: BP 118/79 (BP Location: Right Arm)   Pulse 67   Temp 97.9 F (36.6 C) (Oral)   Resp 15   Ht 5\' 7"  (1.702 m)   Wt 49.5 kg   SpO2 97%   BMI 17.09 kg/m   Physical Exam  Imaging: MR Abdomen W Wo Contrast  Addendum Date: 10/14/2019   ADDENDUM REPORT: 10/14/2019 09:06 ADDENDUM: Over a series of prior studies there is definite increase in biliary ductal dilation. There is LEFT hepatic ductal distension that was not present and there is increasing intrahepatic biliary ductal dilation to the RIGHT hepatic lobe progressing from November until the current study. These results were called by telephone at the time of interpretation on 10/14/2019 at 9:06 am to provider [facility] , who verbally acknowledged these results. Electronically Signed   By: Zetta Bills M.D.   On: 10/14/2019 09:06   Result Date: 10/14/2019 CLINICAL DATA:  Elevated LFTs, follow-up hepatocellular carcinoma. EXAM: MRI ABDOMEN WITHOUT AND WITH CONTRAST TECHNIQUE: Multiplanar multisequence MR imaging of the abdomen was performed both before and after the administration of intravenous contrast. CONTRAST:  74mL GADAVIST GADOBUTROL 1 MMOL/ML IV SOLN COMPARISON:  Multiple prior studies most recent Aug 26, 2019 FINDINGS: Lower chest: Lobular contour lung bases without gross consolidation or effusion. No pericardial fluid. Hepatobiliary: Extensive intrahepatic biliary ductal dilation and central masslike area in the hepatic hilum biased towards RIGHT hepatic lobe is  unchanged compared to recent imaging. Some mild ductal distension of LEFT biliary radicles, perhaps slightly increased compared to the prior study best seen on single shot T2 weighted images (image 11, series 4) predominant ductal distension remains present in the RIGHT hepatic lobe and to a lesser extent the caudate. Study not performed as an MRCP. Diffuse diffusion abnormality throughout the RIGHT hepatic lobe also shows a similar appearance. Expansile portal venous thrombus which accounts for much of the mass like area extends into the main portal vein and central portion of the LEFT portal vein without occluding the structures, this finding is unchanged. Central hypoenhancing area showing minimal enhancement on the subtraction data set measuring 5.2 x 3.0 cm in the axial plane on image 35 of series 16, approximately 5.1 x 3.0 cm previously Pancreas: No mass, inflammatory changes, or other parenchymal abnormality identified. Spleen:  Spleen normal size and contour without splenic lesion. Adrenals/Urinary Tract: Adrenal glands are normal. Symmetric renal enhancement. No hydronephrosis. Stomach/Bowel: Abundant stool in the colon. Limited assessment of gastrointestinal structures without acute process. Vascular/Lymphatic: Calcified and noncalcified atheromatous plaque of the abdominal aorta. No sign of aneurysm. No adenopathy. No adenopathy Other:  No ascites. Musculoskeletal: No suspicious bone lesions identified. IMPRESSION: 1. Question minimal increase in distension of LEFT intrahepatic and LEFT hepatic biliary ducts. This is minimal and there is overall very little interval change in the appearance of the mass, portal vein invasion and RIGHT intrahepatic biliary ductal dilation. 2. Expansile portal venous thrombus which accounts for much of the mass like area extends into the main portal vein and central portion of the LEFT portal vein without occluding the structures, this finding is unchanged. 3. No abdominal  adenopathy. 4. Calcified and noncalcified atheromatous plaque of the abdominal aorta. Electronically Signed: By: Zetta Bills M.D. On: 10/07/2019 21:05   MR ABDOMEN MRCP W WO CONTAST  Result Date: 10/22/2019 CLINICAL DATA:  Hepatitis C, multifocal HCC, worsening liver enzymes and elevated bilirubin, chemotherapy EXAM: MRI ABDOMEN WITHOUT AND WITH CONTRAST (INCLUDING MRCP) TECHNIQUE: Multiplanar multisequence MR imaging of the abdomen was performed both before and after the administration of intravenous contrast. Heavily T2-weighted images of the biliary and pancreatic ducts were obtained, and three-dimensional MRCP images were rendered by post processing. CONTRAST:  34mL GADAVIST GADOBUTROL 1 MMOL/ML IV SOLN COMPARISON:  MR abdomen, 10/07/2019, 08/26/2019, 06/15/2019 FINDINGS: Lower chest: No acute findings. Hepatobiliary: No significant interval change in a large, branching, hypoenhancing mass of the right lobe of the liver, with extensive associated atrophy of the right lobe. The dominant component in the central right lobe of the liver extends into and occludes the central right portal vein. The central bulk of the mass measures approximately 4.2 x 3.2 cm, not significantly changed in size and appearance compared to prior examination when measured similarly (series 20, image 25). Unchanged additional component in the dome of the liver measuring 3.2 x 2.3 cm (series 20, image 10). These demonstrate subtle arterial rim hyperenhancement (series 18, image 26, 10) and there are again multiple small foci of transient arterial hyperenhancement of the right lobe (e.g. Series 18, image 29). Compensatory hypertrophy of the left lobe of the liver. Degree of biliary ductal dilatation in the right lobe appears slightly increased in comparison to prior examination and as noted previously, is gradually increased in comparison multiple more remote prior examinations. Mild biliary ductal dilatation in the left lobe is not  significantly changed in comparison to most recent prior examination dated 10/07/2019 but clearly new when compared to more remote prior examinations. MRCP is severely degraded by motion artifact. Pancreas: No mass, inflammatory changes, or other parenchymal abnormality identified. Spleen:  Within normal limits in size and appearance. Adrenals/Urinary Tract: No masses identified. No evidence of hydronephrosis. Stomach/Bowel: Visualized portions within the abdomen are unremarkable. Vascular/Lymphatic: No pathologically enlarged lymph nodes identified. No abdominal aortic aneurysm demonstrated. Other:  None. Musculoskeletal: No suspicious bone lesions identified. IMPRESSION: 1. No significant interval change in a large, branching, rim hyperenhancing mass of the right lobe of the liver, with extensive associated atrophy of the right lobe of the liver. The dominant component in the central right lobe of the liver extends into and occludes the central right portal vein, not significantly changed in size and appearance compared to prior examination when measured similarly. Unchanged additional component in the dome of the liver. 2. Mild biliary ductal dilatation in  the right lobe appears slightly increased in comparison to prior examination dated 10/07/2019 and as noted previously, gradually increased in comparison to multiple more remote prior examinations. Mild biliary ductal dilatation in the left lobe is not significantly changed in comparison to most recent prior examination dated 10/07/2019 but clearly new when compared to more remote prior examinations. Electronically Signed   By: Eddie Candle M.D.   On: 10/22/2019 19:21   US ABDOMEN LIMITED RUQ  Result Date: 10/22/2019 CLINICAL DATA:  Elevated liver enzymes EXAM: ULTRASOUND ABDOMEN LIMITED RIGHT UPPER QUADRANT COMPARISON:  Abdominal MR October 07 2019 FINDINGS: Gallbladder: No gallstones or wall thickening visualized. No evident pericholecystic fluid. No  sonographic Murphy sign noted by sonographer. Common bile duct: Diameter: 2 mm. No extrahepatic biliary duct dilatation is evident. Central biliary duct prominence in the periportal region appear similar to MR. Liver: No focal lesion identified. Within normal limits in parenchymal echogenicity. Thrombus is seen within the portal vein, incompletely obstructing. Flow in the portal vein is in the anatomic direction. Other: None. IMPRESSION: 1. 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 Flow in the portal vein is in the anatomic direction. 2. The apparent periportal mass seen on recent MR is not distinguishable by ultrasound. Note that there is central intrahepatic biliary duct dilatation in this region. There is no appreciable extrahepatic biliary duct dilatation. 3.  No gallbladder pathology demonstrable on this study. Electronically Signed   By: Lowella Grip III M.D.   On: 10/22/2019 10:27    Labs:  CBC: Recent Labs    10/14/19 0915 10/24/19 0609 10/25/19 0543 10/26/19 0432  WBC 4.2 5.1 6.2 6.8  HGB 12.7* 10.8* 10.4* 10.1*  HCT 38.1* 29.4* 28.5* 29.0*  PLT 160 184 196 209    COAGS: Recent Labs    08/06/19 1643 10/08/19 1258 10/22/19 1126 10/24/19 0609 10/25/19 0543 10/26/19 0432  INR 1.0   < > 1.0 1.0 1.0 1.0  APTT 35  --  32  --   --   --    < > = values in this interval not displayed.    BMP: Recent Labs    10/23/19 0611 10/24/19 0609 10/25/19 0543 10/26/19 0432  NA 134* 135 134* 132*  K 3.8 3.7 3.6 3.9  CL 102 102 103 102  CO2 25 26 25 23   GLUCOSE 93 91 95 91  BUN 18 11 14 10   CALCIUM 9.1 8.9 8.6* 8.7*  CREATININE 0.61 0.51* 0.37* 0.31*  GFRNONAA >60 >60 >60 >60  GFRAA >60 >60 >60 >60    LIVER FUNCTION TESTS: Recent Labs    10/23/19 0611 10/24/19 0609 10/25/19 0543 10/26/19 0432  BILITOT 12.9* 13.1* 13.6* 14.3*  AST 188* 170* 154* 198*  ALT 178* 168* 157* 169*  ALKPHOS 794* 773* 728* 751*  PROT 6.4* 6.2* 6.2*  6.2*  ALBUMIN 2.8* 2.6* 2.6* 2.5*    TUMOR MARKERS: No results for input(s): AFPTM, CEA, CA199, CHROMGRNA in the last 8760 hours.  Assessment and Plan:  Chris Mcdonald is a 64 y.o. male with past medical history significant for hypertension stroke, hepatitis and multifocal hepatocellular carcinoma, admitted with worsening LFTs found to have slight progression of intrahepatic biliary ductal dilatation on MRCP performed 10/22/2019.  Request made for consideration of internal/external biliary drainage catheter placement.  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    Redemonstrated mild, centralized predominant, left-sided intrahepatic biliary dilatation, though the amount of intrahepatic biliary ductal dilatation does not appear commiserate with patient's significantly elevated bilirubin level and thus I am concerned that his rapid increase in LFTs is multifactorial with a component being attributable to development of worsening left-sided intrahepatic biliary duct dilatation feel also due to continued intrinsic hepatic dysfunction due to progression of right-sided disease and atrophy.  As such, I am uncertain whether internal/external biliary drainage catheter placement would result in normalization of LFTs.  The patient will be evaluated by Dr. Allen Norris (GI) later today for consideration of ERCP.  As I am uncertain the patient will be deemed a candidate for ERCP, prolonged conversations were held with the patient regarding the benefits and risks (including but not limited to incomplete normalization of his LFTs, postprocedural pain/discomfort, biliary leakage, catheter malfunction, infection/sepsis).    I explained that initially attempts will be made to  traverse the central obstruction however this may require an additional procedure following several days of catheter externalization.  I explained that given extent of his disease, he would likely NOT be a candidate for internal biliary stent placement and as such, the drainage catheter would likely be chronic and have to be exchanged every 2 to 3 months.  I explained that if despite successful internal/external biliary venous catheter placement the LFTs are not improved, the drainage catheter would have to stay in place for a minimum of 6 weeks to allow for tract maturation and minimize the risks of bile leak.  Following this prolonged and detailed conversation, the patient is agreeable to undergo attempted internal/external biliary drainage catheter if Dr. Allen Norris does not feel he is a good candidate for attempted ERCP.  As such, internal/external bili drainage catheter placement is tentatively scheduled for tomorrow afternoon (this is the earliest scheduling slot the vascular lab can accomodate) and as such patient will be made n.p.o. in anticipation of this potential procedure.  Thank you for this interesting consult.  I greatly enjoyed meeting Chris Mcdonald and look forward to participating in their care.  A copy of this report was sent to the requesting provider on this date.  Electronically Signed: Sandi Mariscal, MD 10/26/2019, 10:16 AM   I spent a total of 20 Minutes in face to face in clinical consultation, greater than 50% of which was counseling/coordinating care for potential internal/external biliary drainage catheter placement.

## 2019-10-27 ENCOUNTER — Inpatient Hospital Stay: Payer: BC Managed Care – PPO

## 2019-10-27 ENCOUNTER — Ambulatory Visit: Payer: BC Managed Care – PPO | Admitting: Gastroenterology

## 2019-10-27 DIAGNOSIS — D649 Anemia, unspecified: Secondary | ICD-10-CM | POA: Diagnosis present

## 2019-10-27 HISTORY — PX: IR BILIARY DRAIN PLACEMENT WITH CHOLANGIOGRAM: IMG6043

## 2019-10-27 LAB — COMPREHENSIVE METABOLIC PANEL
ALT: 183 U/L — ABNORMAL HIGH (ref 0–44)
AST: 222 U/L — ABNORMAL HIGH (ref 15–41)
Albumin: 2.6 g/dL — ABNORMAL LOW (ref 3.5–5.0)
Alkaline Phosphatase: 781 U/L — ABNORMAL HIGH (ref 38–126)
Anion gap: 6 (ref 5–15)
BUN: 12 mg/dL (ref 8–23)
CO2: 24 mmol/L (ref 22–32)
Calcium: 8.8 mg/dL — ABNORMAL LOW (ref 8.9–10.3)
Chloride: 101 mmol/L (ref 98–111)
Creatinine, Ser: 0.46 mg/dL — ABNORMAL LOW (ref 0.61–1.24)
GFR calc Af Amer: >60 mL/min (ref >60)
GFR calc non Af Amer: >60 mL/min (ref >60)
Glucose, Bld: 93 mg/dL (ref 70–99)
Potassium: 3.9 mmol/L (ref 3.5–5.1)
Sodium: 131 mmol/L — ABNORMAL LOW (ref 135–145)
Total Bilirubin: 15.8 mg/dL — ABNORMAL HIGH (ref 0.3–1.2)
Total Protein: 6.1 g/dL — ABNORMAL LOW (ref 6.5–8.1)

## 2019-10-27 LAB — MAGNESIUM: Magnesium: 2 mg/dL (ref 1.7–2.4)

## 2019-10-27 LAB — PROTIME-INR
INR: 1.1 (ref 0.8–1.2)
Prothrombin Time: 13.5 seconds (ref 11.4–15.2)

## 2019-10-27 MED ORDER — SODIUM CHLORIDE 0.9 % IV SOLN
1.0000 g | Freq: Once | INTRAVENOUS | Status: DC
  Filled 2019-10-27: qty 10

## 2019-10-27 MED ORDER — MIDAZOLAM HCL 2 MG/2ML IJ SOLN
INTRAMUSCULAR | Status: AC
  Filled 2019-10-27: qty 2

## 2019-10-27 MED ORDER — IODIXANOL 320 MG/ML IV SOLN: 50.0000 mL | Freq: Once | INTRAVENOUS | Status: DC | PRN

## 2019-10-27 MED ORDER — MIDAZOLAM HCL 2 MG/2ML IJ SOLN
INTRAMUSCULAR | Status: AC | PRN
  Administered 2019-10-27 (×2): 1 mg via INTRAVENOUS

## 2019-10-27 MED ORDER — HYDROMORPHONE HCL 1 MG/ML IJ SOLN
1.0000 mg | INTRAMUSCULAR | Status: DC | PRN
  Administered 2019-10-27 – 2019-10-28 (×5): 1 mg via INTRAVENOUS
  Filled 2019-10-27 (×6): qty 1

## 2019-10-27 MED ORDER — TRAMADOL HCL 50 MG PO TABS
50.0000 mg | ORAL_TABLET | Freq: Three times a day (TID) | ORAL | Status: DC | PRN
  Administered 2019-10-27: 50 mg via ORAL
  Filled 2019-10-27: qty 1

## 2019-10-27 MED ORDER — FENTANYL CITRATE (PF) 100 MCG/2ML IJ SOLN
INTRAMUSCULAR | Status: AC | PRN
  Administered 2019-10-27: 25 ug via INTRAVENOUS
  Administered 2019-10-27: 50 ug via INTRAVENOUS
  Administered 2019-10-27: 25 ug via INTRAVENOUS

## 2019-10-27 MED ORDER — SODIUM CHLORIDE 0.9 % IV SOLN
INTRAVENOUS | Status: AC | PRN
  Administered 2019-10-27: 15:00:00 1 g via INTRAVENOUS

## 2019-10-27 MED ORDER — IODIXANOL 320 MG/ML IV SOLN
50.0000 mL | Freq: Once | INTRAVENOUS | Status: AC
  Administered 2019-10-27: 16:00:00 25 mL

## 2019-10-27 MED ORDER — SODIUM CHLORIDE 0.9% FLUSH
5.0000 mL | Freq: Three times a day (TID) | INTRAVENOUS | Status: DC
  Administered 2019-10-27 – 2019-10-28 (×2): 5 mL

## 2019-10-27 MED ORDER — FENTANYL CITRATE (PF) 100 MCG/2ML IJ SOLN
INTRAMUSCULAR | Status: AC
  Filled 2019-10-27: qty 2

## 2019-10-27 NOTE — Consult Note (Signed)
PHARMACY CONSULT NOTE - FOLLOW UP  Pharmacy Consult for Electrolyte Monitoring and Replacement   Recent Labs: Potassium (mmol/L)  Date Value  10/27/2019 3.9   Magnesium (mg/dL)  Date Value  10/27/2019 2.0   Calcium (mg/dL)  Date Value  10/27/2019 8.8 (L)   Albumin (g/dL)  Date Value  10/27/2019 2.6 (L)   Phosphorus (mg/dL)  Date Value  09/13/2019 3.5   Sodium (mmol/L)  Date Value  10/27/2019 131 (L)     Assessment: Pharmacy consulted to replace electrolytes. 64 y.o. male with a known history of stage IIIB hepatocellular carcinoma on chemotherapy at the cancer center.  Goal of Therapy:  WNL  Plan:  No replacement needed x 3 days.   Pharmacy will continue to follow and replace electrolytes if labs are ordered by provider.  Pernell Dupre, PharmD, BCPS Clinical Pharmacist 10/27/2019 7:51 AM

## 2019-10-27 NOTE — Procedures (Signed)
Pre procedural Dx: Obstructive Juandice Post procedural Dx: Same  Successful placement of a left sided hepatic approach transhepatic 10 Fr biliary drainage catheter with end coiled and locked within the duodenum.   Biliary drain connected to gravity bag.  EBL: Trace Complications: None immediate  Ronny Bacon, MD Pager #: [phone]

## 2019-10-27 NOTE — Plan of Care (Signed)
  Problem: Education: Goal: Knowledge of General Education information will improve Description: Including pain rating scale, medication(s)/side effects and non-pharmacologic comfort measures Outcome: Progressing   Problem: Health Behavior/Discharge Planning: Goal: Ability to manage health-related needs will improve Outcome: Progressing   Problem: Clinical Measurements: Goal: Ability to maintain clinical measurements within normal limits will improve Outcome: Progressing Goal: Will remain free from infection Outcome: Progressing Goal: Diagnostic test results will improve Outcome: Progressing Goal: Respiratory complications will improve Outcome: Progressing Goal: Cardiovascular complication will be avoided Outcome: Progressing   Problem: Activity: Goal: Risk for activity intolerance will decrease Outcome: Progressing   Problem: Nutrition: Goal: Adequate nutrition will be maintained Outcome: Progressing   Problem: Coping: Goal: Level of anxiety will decrease Outcome: Progressing   Problem: Elimination: Goal: Will not experience complications related to bowel motility Outcome: Progressing Goal: Will not experience complications related to urinary retention Outcome: Progressing   Problem: Pain Managment: Goal: General experience of comfort will improve Outcome: Progressing   Problem: Safety: Goal: Ability to remain free from injury will improve Outcome: Progressing   Problem: Skin Integrity: Goal: Risk for impaired skin integrity will decrease Outcome: Progressing   Problem: Education: Goal: Knowledge of the prescribed therapeutic regimen will improve Outcome: Progressing   Problem: Activity: Goal: Ability to implement measures to reduce episodes of fatigue will improve Outcome: Progressing   Problem: Coping: Goal: Ability to identify and develop effective coping behavior will improve Outcome: Progressing   Problem: Nutritional: Goal: Maintenance of  adequate nutrition will improve Outcome: Progressing

## 2019-10-27 NOTE — Progress Notes (Signed)
ANTICOAGULATION CONSULT NOTE - Initial Consult  Pharmacy Consult for IR / prior anticoag  Indication: anticoag  No Known Allergies  Patient Measurements: Height: 5\' 7"  (170.2 cm) Weight: 49.5 kg (109 lb 2 oz) IBW/kg (Calculated) : 66.1 Heparin Dosing Weight:   Vital Signs: Temp: 98.3 F (36.8 C) (07/07 1403) Temp Source: Oral (07/07 1403) BP: 134/82 (07/07 1630) Pulse Rate: 63 (07/07 1615)  Labs: Recent Labs    10/25/19 0543 10/26/19 0432 10/27/19 0424  HGB 10.4* 10.1*  --   HCT 28.5* 29.0*  --   PLT 196 209  --   LABPROT 12.5 12.6 13.5  INR 1.0 1.0 1.1  CREATININE 0.37* 0.31* 0.46*    Estimated Creatinine Clearance: 67.9 mL/min (A) (by C-G formula based on SCr of 0.46 mg/dL (L)).   Medical History: Past Medical History:  Diagnosis Date  . Arthritis   . Hepatitis   . Hypertension   . Liver cancer ([place])   . Spinal stenosis   . Stroke [facility])     Medications:  Medications Prior to Admission  Medication Sig Dispense Refill Last Dose  . aspirin EC 81 MG tablet Take 81 mg by mouth daily.      . hydrochlorothiazide (HYDRODIURIL) 12.5 MG tablet Take 25 mg by mouth daily.      . ondansetron (ZOFRAN) 8 MG tablet Take 1 tablet (8 mg total) by mouth every 8 (eight) hours as needed for nausea or vomiting. 20 tablet 1   . potassium chloride SA (KLOR-CON M20) 20 MEQ tablet TAKE 1 TABLET (20 MEQ TOTAL) BY MOUTH DAILY. AS DIRECTED BY MD (Patient taking differently: Take 20 mEq by mouth daily. TAKE 1 TABLET (20 MEQ TOTAL) BY MOUTH DAILY. AS DIRECTED BY MD) 40 tablet 0   . cabozantinib (CABOMETYX) 20 MG tablet Take 2 tablets (40 mg total) by mouth daily. Take on an empty stomach, 1 hour before or 2 hours after meals. (Patient not taking: Reported on 10/22/2019) 60 tablet 0 Not Taking at Unknown time  . gabapentin (NEURONTIN) 100 MG capsule Take 1 capsule (100 mg total) by mouth 2 (two) times daily. 60 capsule 0     Assessment:  Goal of Therapy:      Plan:  No prior  anticoag orders noted.   Travone Georg D 10/27/2019,4:44 PM

## 2019-10-27 NOTE — Progress Notes (Addendum)
PROGRESS NOTE    Chris Mcdonald   SVX:[id number]  DOB: 01-11-1956  PCP: Donnie Coffin, MD    DOA: 10/22/2019 LOS: 5   Brief Narrative   Chris Mcdonald  is a 64 y.o. male with a known history of stage IIIB hepatocellular carcinoma on chemotherapy at the cancer center. Patient follows with Dr. Mike Gip.  Admitted on 10/22/19 for worsening LFT's and jaundice.  Patient reported symptoms of itchy head and arms after his chemotherapy, and breaking out in transient red spots that are not bothersome.  No fevers/chills, abdominal pain, nausea or vomiting.  He was seen at the cancer center on day of admission.  He'd had labs the day before on 10/21/2019 which showed AST 191, ALT 195, alkPhos 842, and Tbili 8.4.  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  Repeat labs on admission 10/22/19 revealed worsening liver function with Tbili 8.4>>11.3, AST 191>>221, ALT 195>>218.  Dr. Mike Gip discussed with GI who recommended MRCP to evaluate for obstruction and possible biliary drain placement.    MRCP Impression (summarized):  1. ... 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 2. Mild biliary ductal dilatation in the right lobe appears slightly increased in comparison to prior examination dated 10/07/2019 and as noted previously, gradually increased in comparison to multiple more remote prior examinations...    Assessment & Plan   Principal Problem:   Obstructive jaundice due to malignant neoplasm [facility]) Active Problems:   Hyponatremia   Hypokalemia   Elevated LFTs   Hyperbilirubinemia   Hepatocellular carcinoma (HCC)   Moderate protein-calorie malnutrition (HCC)   Essential hypertension   Gastroesophageal reflux disease without esophagitis    Normocytic anemia   Obstructive jaundice due to malignant neoplasm / Elevated LFTs / Hyperbilirubinemia - total bili up to 15.8. Present on admission, see narrative above.  IR consulted for evaluation and consideration of percutaneous biliary drain placement.  Discussed with on call radiologist on 7/3 who stated they can possibly place a left side hepatic drain, but right side not amenable due to tumor burden and high risk.  GI is considering placing stent by ERCP if feasible.  Monitor LFT's and INR daily.  Oncology also following.  Hypokalemia - POA with K 3.0, replaced.  Monitor BMP daily and replace as needed.  Patient takes KCl 20 mEq daily at home.  Hyponatremia - POA, mild with Na 134, but has since decreased to 131.  Pending labs: urine and serum osm, TSH , cortisol, urine lytes.  Monitor BMP.  Hepatocellular carcinoma, stage IIIb - diagnosed in Feb 2020, followed by Dr. Mike Gip, on chemotherapy s/p cycle #1 of nivolumab 9 days prior to admission.       Moderate protein-calorie malnutrition - dietician consulted.  Essential hypertension - hold home HCTZ as BP's are soft.  GERD - continue home PPI.  Muscle spasms - pt has chronic muscle spasms, worse with immobility.  PRN Robaxin.  Peripheral neuropathy - continue gabapentin.  Normocytic anemia - POA, chronic.  Suspect due to chronic disease.  Past iron studies unremarkable.  No signs of bleeding.  Monitor CBC.   Patient BMI: Body mass index is 17.09 kg/m.   DVT prophylaxis: Place and maintain sequential compression device Start: 10/24/19 0746 SCDs Start: 10/22/19 1652   Diet:  Diet  Orders (From admission, onward)    Start     Ordered   10/27/19 0001  Diet NPO time specified  Diet effective midnight        10/26/19 1141            Code Status: Full Code    Subjective 10/27/19    Patient seen during breakfast today at bedside.  He reports feeling well.  Says his nausea is "fair", denies vomiting this morning.   Denies abdominal pain, fever chills or other complaints.     Disposition Plan & Communication   Status is: Inpatient  Remains inpatient appropriate because:Inpatient level of care appropriate due to severity of illness with liver failure.  Patient to have either ERCP with GI or drain placed with IR this afternoon.   Dispo: The patient is from: Home              Anticipated d/c is to: Home              Anticipated d/c date is: 2 days              Patient currently is not medically stable to d/c.   Family Communication: none at bedside during encounter.  Will attempt to call his wife.   Consults, Procedures, Significant Events   Consultants:   Oncology  Interventional radiology  Gastroenterology  Procedures:   none  Antimicrobials:   none    Objective   Vitals:   10/26/19 1932 10/27/19 0033 10/27/19 0518 10/27/19 0748  BP: 108/70 107/70 (!) 96/56 103/70  Pulse: 73 67 67 65  Resp: 16 16 16 18   Temp: 99.9 F (37.7 C) 98.8 F (37.1 C) 98.7 F (37.1 C) 98.6 F (37 C)  TempSrc:  Oral Oral Oral  SpO2: 97% 98% 99% 96%  Weight:      Height:       No intake or output data in the 24 hours ending 10/27/19 1352 Filed Weights   10/22/19 1851  Weight: 49.5 kg    Physical Exam:  General exam: awake, alert, no acute distress, cachectic HEENT: icteric sclera, moist mucus membranes, hearing grossly normal  Respiratory system: CTAB, no wheezes, rales or rhonchi, normal respiratory effort. Cardiovascular system: normal S1/S2, RRR, no pedal edema.   Gastrointestinal system: soft, NT, ND, +bowel sounds. Central nervous system: A&O x4. no gross focal neurologic deficits, normal speech Extremities: moves all, no edema, normal tone Skin: jaundice, dry, intact, normal temperature Psychiatry: normal mood, congruent affect, judgement and insight appear normal  Labs   Data Reviewed: I have personally reviewed following labs and imaging studies  CBC: Recent Labs  Lab  10/24/19 0609 10/25/19 0543 10/26/19 0432  WBC 5.1 6.2 6.8  HGB 10.8* 10.4* 10.1*  HCT 29.4* 28.5* 29.0*  MCV 88.8 88.8 91.8  PLT 184 196 660   Basic Metabolic Panel: Recent Labs  Lab 10/23/19 0611 10/24/19 0609 10/25/19 0543 10/26/19 0432 10/27/19 0424  NA 134* 135 134* 132* 131*  K 3.8 3.7 3.6 3.9 3.9  CL 102 102 103 102 101  CO2 25 26 25 23 24   GLUCOSE 93 91 95 91 93  BUN 18 11 14 10 12   CREATININE 0.61 0.51* 0.37* 0.31* 0.46*  CALCIUM 9.1 8.9 8.6* 8.7* 8.8*  MG  --  1.9 2.0  --  2.0   GFR: Estimated Creatinine Clearance: 67.9 mL/min (A) (by C-G formula based on SCr of 0.46 mg/dL (L)). Liver Function Tests: Recent Labs  Lab 10/23/19 [phone]  10/24/19 0609 10/25/19 0543 10/26/19 0432 10/27/19 0424  AST 188* 170* 154* 198* 222*  ALT 178* 168* 157* 169* 183*  ALKPHOS 794* 773* 728* 751* 781*  BILITOT 12.9* 13.1* 13.6* 14.3* 15.8*  PROT 6.4* 6.2* 6.2* 6.2* 6.1*  ALBUMIN 2.8* 2.6* 2.6* 2.5* 2.6*   No results for input(s): LIPASE, AMYLASE in the last 168 hours. No results for input(s): AMMONIA in the last 168 hours. Coagulation Profile: Recent Labs  Lab 10/22/19 1126 10/24/19 0609 10/25/19 0543 10/26/19 0432 10/27/19 0424  INR 1.0 1.0 1.0 1.0 1.1   Cardiac Enzymes: No results for input(s): CKTOTAL, CKMB, CKMBINDEX, TROPONINI in the last 168 hours. BNP (last 3 results) No results for input(s): PROBNP in the last 8760 hours. HbA1C: No results for input(s): HGBA1C in the last 72 hours. CBG: No results for input(s): GLUCAP in the last 168 hours. Lipid Profile: No results for input(s): CHOL, HDL, LDLCALC, TRIG, CHOLHDL, LDLDIRECT in the last 72 hours. Thyroid Function Tests: No results for input(s): TSH, T4TOTAL, FREET4, T3FREE, THYROIDAB in the last 72 hours. Anemia Panel: No results for input(s): VITAMINB12, FOLATE, FERRITIN, TIBC, IRON, RETICCTPCT in the last 72 hours. Sepsis Labs: No results for input(s): PROCALCITON, LATICACIDVEN in the last 168  hours.  Recent Results (from the past 240 hour(s))  SARS Coronavirus 2 by RT PCR (hospital order, performed in [facility] hospital lab) Nasopharyngeal Nasopharyngeal Swab     Status: None   Collection Time: 10/26/19  3:50 PM   Specimen: Nasopharyngeal Swab  Result Value Ref Range Status   SARS Coronavirus 2 NEGATIVE NEGATIVE Final    Comment: (NOTE) SARS-CoV-2 target nucleic acids are NOT DETECTED.  The SARS-CoV-2 RNA is generally detectable in upper and lower respiratory specimens during the acute phase of infection. The lowest concentration of SARS-CoV-2 viral copies this assay can detect is 250 copies / mL. A negative result does not preclude SARS-CoV-2 infection and should not be used as the sole basis for treatment or other patient management decisions.  A negative result may occur with improper specimen collection / handling, submission of specimen other than nasopharyngeal swab, presence of viral mutation(s) within the areas targeted by this assay, and inadequate number of viral copies (<250 copies / mL). A negative result must be combined with clinical observations, patient history, and epidemiological information.  Fact Sheet for Patients:   StrictlyIdeas.no  Fact Sheet for Healthcare Providers: BankingDealers.co.za  This test is not yet approved or  cleared by the [place] FDA and has been authorized for detection and/or diagnosis of SARS-CoV-2 by FDA under an Emergency Use Authorization (EUA).  This EUA will remain in effect (meaning this test can be used) for the duration of the COVID-19 declaration under Section 564(b)(1) of the Act, 21 U.S.C. section 360bbb-3(b)(1), unless the authorization is terminated or revoked sooner.  Performed at [facility], [address], [place], [place] [address]       Imaging Studies   CT ABDOMEN W WO CONTRAST  Result Date: 10/26/2019 CLINICAL DATA:  Hepatobiliary  cancer.  Restaging. EXAM: CT ABDOMEN WITHOUT AND WITH CONTRAST TECHNIQUE: Multidetector CT imaging of the abdomen was performed following the standard protocol before and following the bolus administration of intravenous contrast. CONTRAST:  69mL OMNIPAQUE IOHEXOL 300 MG/ML  SOLN COMPARISON:  MRI 10/22/2019. FINDINGS: Lower chest: Unremarkable. Hepatobiliary: Central right hepatic lesion seen on recent MRI is similar measuring approximately 4.2 x 2.8 cm today compared to 4.2 x 3.2 cm on recent MRI. Differential measurement likely  related to better contrast resolution on previous MRI. As before, lesion occludes the right portal vein and projects into the portal vein lumen at the left and right portal vein bifurcation. Rim enhancing lesion in the dome of liver measures 3.0 x 3.0 cm today compared to 3.1 x 2.3 cm on recent MRI. Marked intrahepatic biliary duct dilatation again noted with enlargement of the lateral segment left liver and right hepatic atrophy. Middle and right hepatic veins are not well opacified and patency cannot be confirmed on this study. Gallbladder is nondistended with prominent varices in the gallbladder fossa. Pancreas: No focal mass lesion. No dilatation of the main duct. No intraparenchymal cyst. No peripancreatic edema. Spleen: 11.7 cm craniocaudal length.  No focal abnormality. Adrenals/Urinary Tract: No adrenal nodule or mass. Kidneys unremarkable. Stomach/Bowel: Stomach is unremarkable. No gastric wall thickening. No evidence of outlet obstruction. Duodenum is normally positioned as is the ligament of Treitz. No small bowel or colonic dilatation within the visualized abdomen. Vascular/Lymphatic: There is abdominal aortic atherosclerosis without aneurysm. Mild lymphadenopathy identified in the hepatoduodenal ligament with 12 mm short axis hepatoduodenal ligament lymph node identified on 34/5. 14 mm short axis retrocaval lymph node is visible on 44/5. Other: No substantial intraperitoneal  free fluid. Musculoskeletal: No worrisome lytic or sclerotic osseous abnormality. IMPRESSION: 1. Central right hepatic lesion is similar in size in the interval since recent MRI 2 days ago. A second rim enhancing lesion in the dome of liver is also similar. 2. Stable appearance of the occlusion of the right portal vein and extension of tumor/thrombus into the portal vein lumen at the left and right portal vein bifurcation. 3. Marked intrahepatic biliary duct dilatation with enlargement of the lateral segment left liver and right hepatic atrophy. 4. Right and middle hepatic veins are not well opacified and patency cannot be confirmed. 5. Mild lymphadenopathy in the hepatoduodenal ligament and retrocaval space. Findings suspicious for metastatic disease. 6. Splenomegaly. Electronically Signed   By: Misty Stanley M.D.   On: 10/26/2019 10:22     Medications   Scheduled Meds: . feeding supplement  1 Container Oral Q24H  . feeding supplement (KATE FARMS STANDARD 1.4)  325 mL Oral BID BM  . gabapentin  100 mg Oral BID  . multivitamin with minerals  1 tablet Oral Daily  . potassium chloride  20 mEq Oral Once  . sodium chloride flush  3 mL Intravenous Q12H   Continuous Infusions: . sodium chloride 10 mL/hr at 10/23/19 0500       LOS: 5 days    Time spent: 25 minutes    Ezekiel Slocumb, DO [facility]  10/27/2019, 1:52 PM    If 7PM-7AM, please contact night-coverage. How to contact the [facility] Attending or Consulting provider [place] or covering provider during after hours [place], for this patient?    1. Check the care team in [facility] and look for a) attending/consulting TR[facility] provider listed and b) the [facility] team listed 2. Log into [email or web address] and use [place]'s universal password to access. If you do not have the password, please contact the hospital operator. 3. Locate the [facility] provider you are looking for under [facility] and page to a number that you can be directly  reached. 4. If you still have difficulty reaching the provider, please page the [facility] (Director on Call) for the Hospitalists listed on amion for assistance.

## 2019-10-28 ENCOUNTER — Inpatient Hospital Stay: Payer: BC Managed Care – PPO | Admitting: Hematology and Oncology

## 2019-10-28 ENCOUNTER — Inpatient Hospital Stay: Payer: BC Managed Care – PPO

## 2019-10-28 DIAGNOSIS — I10 Essential (primary) hypertension: Secondary | ICD-10-CM

## 2019-10-28 DIAGNOSIS — Z515 Encounter for palliative care: Secondary | ICD-10-CM

## 2019-10-28 LAB — COMPREHENSIVE METABOLIC PANEL
ALT: 218 U/L — ABNORMAL HIGH (ref 0–44)
AST: 251 U/L — ABNORMAL HIGH (ref 15–41)
Albumin: 2.6 g/dL — ABNORMAL LOW (ref 3.5–5.0)
Alkaline Phosphatase: 804 U/L — ABNORMAL HIGH (ref 38–126)
Anion gap: 10 (ref 5–15)
BUN: 13 mg/dL (ref 8–23)
CO2: 22 mmol/L (ref 22–32)
Calcium: 9.2 mg/dL (ref 8.9–10.3)
Chloride: 98 mmol/L (ref 98–111)
Creatinine, Ser: 0.55 mg/dL — ABNORMAL LOW (ref 0.61–1.24)
GFR calc Af Amer: >60 mL/min (ref >60)
GFR calc non Af Amer: >60 mL/min (ref >60)
Glucose, Bld: 115 mg/dL — ABNORMAL HIGH (ref 70–99)
Potassium: 3.6 mmol/L (ref 3.5–5.1)
Sodium: 130 mmol/L — ABNORMAL LOW (ref 135–145)
Total Bilirubin: 14.4 mg/dL — ABNORMAL HIGH (ref 0.3–1.2)
Total Protein: 6.4 g/dL — ABNORMAL LOW (ref 6.5–8.1)

## 2019-10-28 LAB — CBC WITH DIFFERENTIAL/PLATELET
Abs Immature Granulocytes: 0.05 10*3/uL (ref 0.00–0.07)
Basophils Absolute: 0 10*3/uL (ref 0.0–0.1)
Basophils Relative: 1 %
Eosinophils Absolute: 0.1 10*3/uL (ref 0.0–0.5)
Eosinophils Relative: 1 %
HCT: 30 % — ABNORMAL LOW (ref 39.0–52.0)
Hemoglobin: 10.4 g/dL — ABNORMAL LOW (ref 13.0–17.0)
Immature Granulocytes: 1 %
Lymphocytes Relative: 8 %
Lymphs Abs: 0.7 10*3/uL (ref 0.7–4.0)
MCH: 32.5 pg (ref 26.0–34.0)
MCHC: 34.7 g/dL (ref 30.0–36.0)
MCV: 93.8 fL (ref 80.0–100.0)
Monocytes Absolute: 0.4 10*3/uL (ref 0.1–1.0)
Monocytes Relative: 5 %
Neutro Abs: 7.4 10*3/uL (ref 1.7–7.7)
Neutrophils Relative %: 84 %
Platelets: 213 10*3/uL (ref 150–400)
RBC: 3.2 MIL/uL — ABNORMAL LOW (ref 4.22–5.81)
RDW: 19.7 % — ABNORMAL HIGH (ref 11.5–15.5)
WBC: 8.8 10*3/uL (ref 4.0–10.5)
nRBC: 0 % (ref 0.0–0.2)

## 2019-10-28 LAB — TSH: TSH: 8.107 u[IU]/mL — ABNORMAL HIGH (ref 0.350–4.500)

## 2019-10-28 LAB — CORTISOL: Cortisol, Plasma: 19.9 ug/dL

## 2019-10-28 LAB — OSMOLALITY: Osmolality: 276 mOsm/kg (ref 275–295)

## 2019-10-28 LAB — T4, FREE: Free T4: 0.65 ng/dL (ref 0.61–1.12)

## 2019-10-28 MED ORDER — TRAMADOL HCL 50 MG PO TABS: 50.0000 mg | ORAL_TABLET | Freq: Three times a day (TID) | ORAL | 0 refills | Status: AC | PRN

## 2019-10-28 MED ORDER — MORPHINE SULFATE (CONCENTRATE) 10 MG /0.5 ML PO SOLN: 5.0000 mg | ORAL | 0 refills | Status: DC | PRN

## 2019-10-28 MED ORDER — METHOCARBAMOL 500 MG PO TABS: 1000.0000 mg | ORAL_TABLET | Freq: Four times a day (QID) | ORAL | 0 refills | Status: AC | PRN

## 2019-10-28 MED ORDER — LORAZEPAM 0.5 MG PO TABS: 0.5000 mg | ORAL_TABLET | Freq: Three times a day (TID) | ORAL | 0 refills | Status: AC | PRN

## 2019-10-28 MED ORDER — POTASSIUM CHLORIDE CRYS ER 20 MEQ PO TBCR
20.0000 meq | EXTENDED_RELEASE_TABLET | Freq: Once | ORAL | Status: AC
  Administered 2019-10-28: 20 meq via ORAL
  Filled 2019-10-28: qty 1

## 2019-10-28 MED ORDER — HYDROXYZINE HCL 25 MG PO TABS
25.0000 mg | ORAL_TABLET | Freq: Three times a day (TID) | ORAL | Status: DC | PRN
  Administered 2019-10-28: 13:00:00 25 mg via ORAL
  Filled 2019-10-28 (×3): qty 1

## 2019-10-28 MED ORDER — HYDROXYZINE HCL 25 MG PO TABS: 25.0000 mg | ORAL_TABLET | Freq: Three times a day (TID) | ORAL | 0 refills | Status: AC | PRN

## 2019-10-28 NOTE — Progress Notes (Signed)
IV removed before discharge. Went over discharge paperwork with patient and patients wife. All questions answered and they stated that they understood. Patient going home POV with wife and daughter.

## 2019-10-28 NOTE — Consult Note (Signed)
PHARMACY CONSULT NOTE - FOLLOW UP  Pharmacy Consult for Electrolyte Monitoring and Replacement   Recent Labs: Potassium (mmol/L)  Date Value  10/28/2019 3.6   Magnesium (mg/dL)  Date Value  10/27/2019 2.0   Calcium (mg/dL)  Date Value  10/28/2019 9.2   Albumin (g/dL)  Date Value  10/28/2019 2.6 (L)   Phosphorus (mg/dL)  Date Value  09/13/2019 3.5   Sodium (mmol/L)  Date Value  10/28/2019 130 (L)     Assessment: Pharmacy consulted to replace electrolytes. 64 y.o. male with a known history of stage IIIB hepatocellular carcinoma on chemotherapy at the cancer center.  Goal of Therapy:  WNL  Plan:  Potassium trending down. Will order KCL 3mEq x 1 dose this morning.   Pharmacy will continue to follow and replace electrolytes if labs are ordered by provider.  Pernell Dupre, PharmD, BCPS Clinical Pharmacist 10/28/2019 7:54 AM

## 2019-10-28 NOTE — Progress Notes (Signed)
[place] [facility] Liaison RN note   Received request from [place] and Billey Chang, NP for patient/family request for hospice services at home after discharge. Chart and patient information under review by [facility] physician. Hospice eligibility pending at this time.  Met with patient, wife and daughter at bedside to initiate education related to hospice philosophy, services and team approach to care. Patient/family verbalized understanding of information given. Per discussion, plan is for discharge to home by private vehicle today.  Please send signed and completed DNR home with patient/family.    Please make arrangements for patient to have comfort medications.  DME needs discussed. Will be evaluated by assessment RN at assessment visit.   Address has been verified and is correct in the chart.   [facility] aware of the above. Above information shared with Jeanna with TOC.  Please call with hospice related questions or concerns.  Thank you for this referral.  Margaretmary Eddy, BSN, RN [facility] Liaison [phone]

## 2019-10-28 NOTE — TOC Initial Note (Signed)
Transition of Care [facility]) - Initial/Assessment Note    Patient Details  Name: Chris Mcdonald MRN: [id number] Date of Birth: February 13, 1956  [facility]) CM/SW Contact:    Shelbie Hutching, RN Phone Number: 10/28/2019, 11:55 AM  Clinical Narrative:                 After speaking with [place], patient and family would like to go home with hospice.  Family has no preference in agency.  Kieth Brightly and Traci with [facility] given home with hospice referral.  Patient will discharge home today with his family.   Expected Discharge Plan: Home w Hospice Care Barriers to Discharge: Barriers Resolved   Patient Goals and CMS Choice   CMS Medicare.gov Compare Post Acute Care list provided to:: Patient Choice offered to / list presented to : Patient, Spouse  Expected Discharge Plan and Services Expected Discharge Plan: [place] Acute Care Choice: Hospice Living arrangements for the past 2 months: Single Family Home                                      Prior Living Arrangements/Services Living arrangements for the past 2 months: Single Family Home Lives with:: Spouse Patient language and need for interpreter reviewed:: Yes Do you feel safe going back to the place where you live?: Yes      Need for Family Participation in Patient Care: Yes (Comment) (cancer- home with hospice) Care giver support system in place?: Yes (comment) (wife)   Criminal Activity/Legal Involvement Pertinent to Current Situation/Hospitalization: No - Comment as needed  Activities of Daily Living Home Assistive Devices/Equipment: None ADL Screening (condition at time of admission) Patient's cognitive ability adequate to safely complete daily activities?: Yes Is the patient deaf or have difficulty hearing?: No Does the patient have difficulty seeing, even when wearing glasses/contacts?: No Does the patient have difficulty concentrating, remembering, or making  decisions?: Yes Patient able to express need for assistance with ADLs?: No Does the patient have difficulty dressing or bathing?: No Independently performs ADLs?: Yes (appropriate for developmental age) Does the patient have difficulty walking or climbing stairs?: Yes Weakness of Legs: Both Weakness of Arms/Hands: None  Permission Sought/Granted Permission sought to share information with : Case Manager, Customer service manager, Family Supports Permission granted to share information with : Yes, Verbal Permission Granted     Permission granted to share info w AGENCY: [facility]  Permission granted to share info w Relationship: wife     Emotional Assessment Appearance:: Appears older than stated age     Orientation: : Oriented to Self, Oriented to Place, Oriented to  Time, Oriented to Situation Alcohol / Substance Use: Not Applicable Psych Involvement: No (comment)  Admission diagnosis:  Jaundice [R17] Patient Active Problem List   Diagnosis Date Noted  . Normocytic anemia 10/27/2019  . Obstructive jaundice due to malignant neoplasm ([place]) 10/23/2019  . Hyperbilirubinemia 10/23/2019  . Elevated LFTs 10/22/2019  . Jaundice 10/22/2019  . Moderate protein-calorie malnutrition ([place])   . Hepatitis C virus infection without hepatic coma 10/09/2019  . Encounter for antineoplastic immunotherapy 10/08/2019  . Dehydration 09/26/2019  . Non-intractable vomiting 09/26/2019  . Elevated TSH 09/13/2019  . Abdominal pain 08/29/2019  . Abdominal discomfort 08/12/2019  . TIA (transient ischemic attack) 08/06/2019  . Left sided numbness   . Essential hypertension   .  Malignant neoplasm of liver ([place])   . Gastroesophageal reflux disease without esophagitis   . Other secondary hypertension 05/22/2019  . Weight loss 01/03/2019  . Hyponatremia 09/19/2018  . Hypokalemia 09/19/2018  . Lung nodules 09/17/2018  . Hypercalcemia 08/17/2018  . Goals of care, counseling/discussion  08/16/2018  . Hepatocellular carcinoma ([place]) 07/26/2018   PCP:  Donnie Coffin, MD Pharmacy:   [id number] [place], [place] [address] [place] [address] Phone: [phone] Fax: [phone]  [facility] All Sites - [place], [address] [place] [address] Phone: [phone] Fax: [phone]     Social Determinants of Health (SDOH) Interventions    Readmission Risk Interventions No flowsheet data found.

## 2019-10-28 NOTE — Discharge Instructions (Signed)
Hospice Hospice is a service that is designed to provide people who are terminally ill and their families with medical, spiritual, and psychological support. Its aim is to improve your quality of life by keeping you as comfortable as possible in the final stages of life. Who will be my providers when I begin hospice care? Hospice teams often include:  A nurse.  A doctor. The hospice doctor will be available for your care, but you can include your regular doctor or nurse practitioner.  A Education officer, museum.  A counselor.  A religious leader (such as a Clinical biochemist).  A dietitian.  Therapists.  Trained volunteers who can help with care. What services does hospice provide? Hospice services can vary depending on the center or organization. Generally, they include:  Ways to keep you comfortable, such as: ? Providing care in your home or in a home-like setting. ? Working with your family and friends to help meet your needs. ? Allowing you to enjoy the support of loved ones by receiving much of your basic care from family and friends.  Pain relief and symptom management. The staff will supply all necessary medicines and equipment so that you can stay comfortable and alert enough to enjoy the company of your friends and family.  Visits or care from a nurse and doctor. This may include 24-hour on-call services.  Companionship when you are alone.  Allowing you and your family to rest. Hospice staff may do light housekeeping, prepare meals, and run errands.  Counseling. They will make sure your emotional, spiritual, and social needs are being met, as well as those needs of your family members.  Spiritual care. This will be individualized to meet your needs and your family's needs. It may involve: ? Helping you and your family understand the dying process. ? Helping you say goodbye to your family and friends. ? Performing a specific religious ceremony or ritual.  Massage.  Nutrition  therapy.  Physical and occupational therapy.  Short-term inpatient care, if something cannot be managed in the home.  Art or music therapy.  Bereavement support for grieving family members. When should hospice care begin? Most people who use hospice are believed to have less than 6 months to live.  Your family and health care providers can help you decide when hospice services should begin.  If you live longer than 6 months but your condition does not improve, your doctor may be able to approve you for continued hospice care.  If your condition improves, you may discontinue the program. What should I consider before selecting a program? Most hospice programs are run by nonprofit, independent organizations. Some are affiliated with hospitals, nursing homes, or home health care agencies. Hospice programs can take place in your home or at a hospice center, hospital, or skilled nursing facility. When choosing a hospice program, ask the following questions:  What services are available to me?  What services will be offered to my loved ones?  How involved will my loved ones be?  How involved will my health care provider be?  Who makes up the hospice care team? How are they trained or screened?  How will my pain and symptoms be managed?  If my circumstances change, can the services be provided in a different setting, such as my home or in the hospital?  Is the program reviewed and licensed by the state or certified in some other way?  What does it cost? Is it covered by insurance?  If I choose a hospice  center or nursing home, where is the hospice center located? Is it convenient for family and friends?  If I choose a hospice center or nursing home, can my family and friends visit any time?  Will you provide emotional and spiritual support?  Who can my family call with questions? Where can I learn more about hospice? You can learn about existing hospice programs in your area  from your health care providers. You can also read more about hospice online. The websites of the following organizations have helpful information:  [facility] Organization [facility]): http://www.brown-buchanan.com/  National Association for [facility]): http://massey-hart.com/  Hospice Foundation of [place]): www.hospicefoundation.org  American Cancer Society (ACS): www.cancer.org  H[facility]ice [facility]: www.hospicenet.org  [employer]ations of [place] (VNAA): www.vnaa.org You may also find more information by contacting the following agencies:  A local agency on aging.  Your local [employer] chapter.  Your state's department of health or social services. Summary  Hospice is a service that is designed to provide people who are terminally ill and their families with medical, spiritual, and psychological support.  Hospice aims to improve your quality of life by keeping you as comfortable as possible in the final stages of life.  Hospice teams often include a doctor, nurse, social worker, counselor, religious leader,dietitian, therapists, and volunteers.  Hospice care generally includes medicine for symptom management, visits from doctors and nurses, physical and occupational therapy, nutrition counseling, spiritual and emotional counseling, caregiver support, and bereavement support for grieving family members.  Hospice programs can take place in your home or at a hospice center, hospital, or skilled nursing facility. This information is not intended to replace advice given to you by your health care provider. Make sure you discuss any questions you have with your health care provider. Document Revised: 12/30/2018 Document Reviewed: 04/30/2016 Elsevier Patient Education  [place].

## 2019-10-28 NOTE — Consult Note (Signed)
[place]  [phone] [phone]   Name: Chris Mcdonald Date: 10/28/2019 MRN: [id number]  DOB: July 18, 1955  Patient Care Team: Donnie Coffin, MD as PCP - General (Family Medicine) Ok Edwards, NP as Nurse Practitioner (Gastroenterology) Lollie Sails, MD (Inactive) as Consulting Physician (Gastroenterology)    REASON FOR CONSULTATION: Chris Mcdonald is a 63 y.o. male with multiple medical problems including multifocal stage IIIc hepatocellular carcinoma who was admitted to the hospital through the oncology clinic on 7 2/21 with a rapidly rising bilirubin and transaminases.  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  He underwent left-sided biliary drain on 10/27/2019.  Patient is not felt to be amenable to further interventions including surgery, stenting, or systemic treatment.  He was referred to palliative care to help address goals.   SOCIAL HISTORY:     reports that he has been smoking cigarettes. He has a 75.00 pack-year smoking history. He has never used smokeless tobacco. He reports that he does not drink alcohol and does not use drugs.   Patient is married and lives at home with his wife, son, and daughter.  Patient previously worked at [employer] in [employer].  ADVANCE DIRECTIVES:  Does not have  CODE STATUS: DNR/DNI (DNR form signed on 10/28/2019)  PAST MEDICAL HISTORY: Past Medical History:  Diagnosis Date  . Arthritis   . Hepatitis   . Hypertension   . Liver cancer ([place])   . Spinal stenosis   . Stroke [facility])     PAST SURGICAL HISTORY:  Past Surgical History:  Procedure Laterality Date  . COLONOSCOPY WITH PROPOFOL N/A  02/21/2015   Procedure: COLONOSCOPY WITH PROPOFOL;  Surgeon: Lollie Sails, MD;  Location: [facility] ENDOSCOPY;  Service: Endoscopy;  Laterality: N/A;  . INGUINAL HERNIA REPAIR Bilateral   . IR BILIARY DRAIN PLACEMENT WITH CHOLANGIOGRAM  10/27/2019  . IR RADIOLOGIST EVAL & MGMT  08/04/2018  . POSTERIOR CERVICAL VERTEBRAE EXCISION      HEMATOLOGY/ONCOLOGY HISTORY:  Oncology History  Hepatocellular carcinoma ([place])  07/26/2018 Initial Diagnosis   Hepatocellular carcinoma ([place])   10/14/2019 -  Chemotherapy   The patient had nivolumab (OPDIVO) 240 mg in sodium chloride 0.9 % 100 mL chemo infusion, 240 mg, Intravenous, Once, 1 of 6 cycles Administration: 240 mg (10/14/2019)  for chemotherapy treatment.      ALLERGIES:  has No Known Allergies.  MEDICATIONS:  Current Facility-Administered Medications  Medication Dose Route Frequency Provider Last Rate Last Admin  . 0.9 %  sodium chloride infusion  250 mL Intravenous PRN Fritzi Mandes, MD 10 mL/hr at 10/23/19 0500 Rate Verify at 10/23/19 0500  . cefTRIAXone (ROCEPHIN) 1 g in sodium chloride 0.9 % 100 mL IVPB  1 g Intravenous Once Sandi Mariscal, MD      . diphenhydrAMINE (BENADRYL) capsule 25 mg  25 mg Oral Q6H PRN Nicole Kindred A, DO   25 mg at 10/28/19 0919  . feeding supplement (BOOST / RESOURCE BREEZE) liquid 1 Container  1 Container Oral Q24H Nicole Kindred A, DO   1 Container at 10/25/19 1500  . feeding supplement (KATE FARMS STANDARD 1.4) liquid 325 mL  325 mL Oral BID BM Nicole Kindred A, DO   325 mL at 10/28/19  0930  . gabapentin (NEURONTIN) capsule 100 mg  100 mg Oral BID Esaw Grandchild A, DO   100 mg at 10/28/19 0919  . HYDROmorphone (DILAUDID) injection 1 mg  1 mg Intravenous Q3H PRN Esaw Grandchild A, DO   1 mg at 10/28/19 1319  . hydrOXYzine (ATARAX/VISTARIL) tablet 25 mg  25 mg Oral TID PRN Delfino Lovett, MD   25 mg at 10/28/19 1308  . ibuprofen (ADVIL) tablet 400 mg  400 mg Oral Q6H PRN Esaw Grandchild A, DO   400 mg at 10/24/19 1513    . methocarbamol (ROBAXIN) tablet 1,000 mg  1,000 mg Oral Q6H PRN Esaw Grandchild A, DO      . multivitamin with minerals tablet 1 tablet  1 tablet Oral Daily Esaw Grandchild A, DO   1 tablet at 10/28/19 0918  . ondansetron (ZOFRAN) injection 4 mg  4 mg Intravenous Q4H PRN Esaw Grandchild A, DO   4 mg at 10/28/19 1310  . senna-docusate (Senokot-S) tablet 1 tablet  1 tablet Oral QHS PRN Enedina Finner, MD      . sodium chloride flush (NS) 0.9 % injection 3 mL  3 mL Intravenous Q12H Enedina Finner, MD   3 mL at 10/28/19 0930  . sodium chloride flush (NS) 0.9 % injection 3 mL  3 mL Intravenous PRN Enedina Finner, MD      . sodium chloride flush (NS) 0.9 % injection 5 mL  5 mL Intracatheter Q8H Simonne Come, MD   5 mL at 10/28/19 [phone]  . traMADol (ULTRAM) tablet 50 mg  50 mg Oral Q8H PRN Esaw Grandchild A, DO   50 mg at 10/27/19 1941   Current Outpatient Medications  Medication Sig Dispense Refill  . aspirin EC 81 MG tablet Take 81 mg by mouth daily.     . ondansetron (ZOFRAN) 8 MG tablet Take 1 tablet (8 mg total) by mouth every 8 (eight) hours as needed for nausea or vomiting. 20 tablet 1  . gabapentin (NEURONTIN) 100 MG capsule Take 1 capsule (100 mg total) by mouth 2 (two) times daily. 60 capsule 0  . hydrOXYzine (ATARAX/VISTARIL) 25 MG tablet Take 1 tablet (25 mg total) by mouth 3 (three) times daily as needed for itching. 30 tablet 0  . LORazepam (ATIVAN) 0.5 MG tablet Take 1 tablet (0.5 mg total) by mouth every 8 (eight) hours as needed for up to 10 days for anxiety. 30 tablet 0  . methocarbamol (ROBAXIN) 500 MG tablet Take 2 tablets (1,000 mg total) by mouth every 6 (six) hours as needed for muscle spasms. 20 tablet 0  . Morphine Sulfate (MORPHINE CONCENTRATE) 10 mg / 0.5 ml concentrated solution Take 0.25 mLs (5 mg total) by mouth every 2 (two) hours as needed for severe pain. 30 mL 0  . traMADol (ULTRAM) 50 MG tablet Take 1 tablet (50 mg total) by mouth every 8 (eight) hours as needed for moderate  pain. 30 tablet 0    VITAL SIGNS: BP 113/75 (BP Location: Left Arm)   Pulse 76   Temp 98.2 F (36.8 C) (Oral)   Resp 17   Ht 5\' 7"  (1.702 m)   Wt 109 lb 2 oz (49.5 kg)   SpO2 99%   BMI 17.09 kg/m  Filed Weights   10/22/19 1851 10/27/19 1403  Weight: 109 lb 2 oz (49.5 kg) 109 lb 2 oz (49.5 kg)    Estimated body mass index is 17.09 kg/m as calculated from the following:   Height as  of this encounter: '5\' 7"'$  (1.702 m).   Weight as of this encounter: 109 lb 2 oz (49.5 kg).  LABS: CBC:    Component Value Date/Time   WBC 8.8 10/28/2019 0438   HGB 10.4 (L) 10/28/2019 0438   HCT 30.0 (L) 10/28/2019 0438   PLT 213 10/28/2019 0438   MCV 93.8 10/28/2019 0438   NEUTROABS 7.4 10/28/2019 0438   LYMPHSABS 0.7 10/28/2019 0438   MONOABS 0.4 10/28/2019 0438   EOSABS 0.1 10/28/2019 0438   BASOSABS 0.0 10/28/2019 0438   Comprehensive Metabolic Panel:    Component Value Date/Time   NA 130 (L) 10/28/2019 0438   K 3.6 10/28/2019 0438   CL 98 10/28/2019 0438   CO2 22 10/28/2019 0438   BUN 13 10/28/2019 0438   CREATININE 0.55 (L) 10/28/2019 0438   GLUCOSE 115 (H) 10/28/2019 0438   CALCIUM 9.2 10/28/2019 0438   AST 251 (H) 10/28/2019 0438   ALT 218 (H) 10/28/2019 0438   ALKPHOS 804 (H) 10/28/2019 0438   BILITOT 14.4 (H) 10/28/2019 0438   PROT 6.4 (L) 10/28/2019 0438   ALBUMIN 2.6 (L) 10/28/2019 0438    RADIOGRAPHIC STUDIES: MR Abdomen W Wo Contrast  Addendum Date: 10/14/2019   ADDENDUM REPORT: 10/14/2019 09:06 ADDENDUM: Over a series of prior studies there is definite increase in biliary ductal dilation. There is LEFT hepatic ductal distension that was not present and there is increasing intrahepatic biliary ductal dilation to the RIGHT hepatic lobe progressing from November until the current study. These results were called by telephone at the time of interpretation on 10/14/2019 at 9:06 am to provider [facility] , who verbally acknowledged these results. Electronically Signed    By: Zetta Bills M.D.   On: 10/14/2019 09:06   Result Date: 10/14/2019 CLINICAL DATA:  Elevated LFTs, follow-up hepatocellular carcinoma. EXAM: MRI ABDOMEN WITHOUT AND WITH CONTRAST TECHNIQUE: Multiplanar multisequence MR imaging of the abdomen was performed both before and after the administration of intravenous contrast. CONTRAST:  16m GADAVIST GADOBUTROL 1 MMOL/ML IV SOLN COMPARISON:  Multiple prior studies most recent Aug 26, 2019 FINDINGS: Lower chest: Lobular contour lung bases without gross consolidation or effusion. No pericardial fluid. Hepatobiliary: Extensive intrahepatic biliary ductal dilation and central masslike area in the hepatic hilum biased towards RIGHT hepatic lobe is unchanged compared to recent imaging. Some mild ductal distension of LEFT biliary radicles, perhaps slightly increased compared to the prior study best seen on single shot T2 weighted images (image 11, series 4) predominant ductal distension remains present in the RIGHT hepatic lobe and to a lesser extent the caudate. Study not performed as an MRCP. Diffuse diffusion abnormality throughout the RIGHT hepatic lobe also shows a similar appearance. Expansile portal venous thrombus which accounts for much of the mass like area extends into the main portal vein and central portion of the LEFT portal vein without occluding the structures, this finding is unchanged. Central hypoenhancing area showing minimal enhancement on the subtraction data set measuring 5.2 x 3.0 cm in the axial plane on image 35 of series 16, approximately 5.1 x 3.0 cm previously Pancreas: No mass, inflammatory changes, or other parenchymal abnormality identified. Spleen:  Spleen normal size and contour without splenic lesion. Adrenals/Urinary Tract: Adrenal glands are normal. Symmetric renal enhancement. No hydronephrosis. Stomach/Bowel: Abundant stool in the colon. Limited assessment of gastrointestinal structures without acute process. Vascular/Lymphatic:  Calcified and noncalcified atheromatous plaque of the abdominal aorta. No sign of aneurysm. No adenopathy. No adenopathy Other:  No ascites. Musculoskeletal: No  suspicious bone lesions identified. IMPRESSION: 1. Question minimal increase in distension of LEFT intrahepatic and LEFT hepatic biliary ducts. This is minimal and there is overall very little interval change in the appearance of the mass, portal vein invasion and RIGHT intrahepatic biliary ductal dilation. 2. Expansile portal venous thrombus which accounts for much of the mass like area extends into the main portal vein and central portion of the LEFT portal vein without occluding the structures, this finding is unchanged. 3. No abdominal adenopathy. 4. Calcified and noncalcified atheromatous plaque of the abdominal aorta. Electronically Signed: By: Zetta Bills M.D. On: 10/07/2019 21:05   [place] Intraoperative  Result Date: 10/27/2019 INDICATION: History of multifocal hepatocellular carcinoma, now with obstructive jaundice and worsening of LFTs due to progression of central hepatic lesion, now with left-sided intrahepatic biliary duct dilatation (note, patient has pre-existing marked atrophy of the right lobe of the liver secondary to predominantly right-sided multifocal hepatocellular carcinoma). Patient has been evaluated by the gastroenterology service and deemed not a candidate for ERCP given extent of his disease. As such, request made for image guided placement of a internal/external biliary drainage catheter. EXAM: ULTRASOUND AND FLUOROSCOPIC GUIDED PERCUTANEOUS TRANSHEPATIC CHOLANGIOGRAM AND BILIARY TUBE PLACEMENT COMPARISON:  CT abdomen pelvis-10/26/2019; MRCP-10/22/2019 MEDICATIONS: Rocephin 1 gm IV; The antibiotic was administered with an appropriate time frame prior to the initiation of the procedure CONTRAST:  61m VISIPAQUE IODIXANOL 320 MG/ML IV SOLN - administered into the biliary tree. ANESTHESIA/SEDATION: Moderate (conscious) sedation  was employed during this procedure. A total of Versed 2 mg and Fentanyl 100 mcg was administered intravenously. Moderate Sedation Time: 55 minutes. The patient's level of consciousness and vital signs were monitored continuously by radiology nursing throughout the procedure under my direct supervision. FLUOROSCOPY TIME:  5 minutes, 18 seconds (1539mGy) COMPLICATIONS: None immediate. TECHNIQUE: Informed written consent was obtained from the patient after a discussion of the risks, benefits and alternatives to treatment. Questions regarding the procedure were encouraged and answered. A timeout was performed prior to the initiation of the procedure. The midline of the upper abdomen was prepped and draped in the usual sterile fashion, and a sterile drape was applied covering the operative field. Maximum barrier sterile technique with sterile gowns and gloves were used for the procedure. A timeout was performed prior to the initiation of the procedure. Sonographic evaluation demonstrates primarily centralized dilatation of the left-sided intrahepatic biliary system with very minimal dilatation of the more peripherally located bile ducts. Several attempts were made to cannulate a peripheral bile duct after the overlying soft tissues were anesthetized 1% lidocaine with epinephrine, and while challenging, ultimately a very mildly dilated peripheral left-sided intrahepatic biliary duct was successfully cannulated. Appropriate positioning was confirmed with limited contrast injection. Next, the duct was cannulated with a Nitrex wire and dilated with an Accustick set under fluoroscopic guidance. Limited cholangiograms were performed in various obliquities confirming appropriate access. Next, a 4 French angled glide catheter was advanced through the outer sheath of the Accustick set and with the use of a stiff Glidewire, advanced through the biliary hilum, common bile duct and ampulla to the level of the duodenum. Contrast  injection confirmed appropriate positioning. Under intermittent fluoroscopic guidance and over an Amplatz wire, the track was dilated ultimately allowing placement of a 10 [place] drainage catheter with coil ultimately locked within the duodenum. Contrast was injected and a completion radiographs were obtained in various obliquities. The catheter was connected to a drainage bag which yielded the brisk return of clear bile.  The catheter was secured to the skin with an interrupted suture and StatLock device. Dressings were applied. The patient tolerated the procedure well without immediate postprocedural complication. FINDINGS: Sonographic evaluation of the liver demonstrates moderate dilatation of the central aspect of the left intrahepatic biliary system with very minimal dilatation of the peripheral aspect of the intrahepatic biliary ductal dilatation While challenging, ultimately, a mildly dilated peripheral left-sided bile duct was successfully cannulated. Contrast injection demonstrates malignant obstruction of the central aspect of the left intrahepatic biliary tree at the level of the hilum as was demonstrated on preceding abdominal CT. Ultimately, with the use of a regular glidewire, a 4 [place] glide catheter was advanced beyond the malignant obstruction to the level of the duodenum. Following biliary drainage catheter placement, the end of the biliary drainage catheter is appropriately positioned with end coiled and locked within the duodenum while the radiopaque side marker is positioned at the level of the central aspect the intrahepatic biliary tree. Post completion radiographs demonstrate malignant narrowing/occlusion of the proper hepatic duct extending from the level of the biliary hilum to the confluence of the cystic duct. The cystic duct remains patent with opacification of gallbladder lumen. The CBD appears normal and widely patent to the level of the duodenum. There is no significant  opacification of the right intrahepatic biliary system. IMPRESSION: 1. Successful placement of a 10.2 French percutaneous biliary drainage catheter with end coiled and locked within the duodenum. 2. Malignant narrowing/occlusion of the proper hepatic duct extending from the biliary hilum to the confluence of the cystic duct. PLAN: - Maintain the biliary drainage catheter to external drainage. - Flush the biliary drainage catheter with 10 cc of saline b.i.d. - Maintain diligent records regarding daily drainage catheter output, realizing that some bile may drain internally as well as externally. - Obtain daily CMPS until the patient's LFTs have reached a nadir. - Once the patient's LFTs have reached a nadir, the patient may return for definitive cholangiogram and consideration of biliary drainage catheter capping as indicated. Note, it is unlikely the patient will ever be a stent candidate given the location of the malignant narrowing at the level hilum. Electronically Signed   By: Simonne Come M.D.   On: 10/27/2019 16:15   CT ABDOMEN W WO CONTRAST  Result Date: 10/26/2019 CLINICAL DATA:  Hepatobiliary cancer.  Restaging. EXAM: CT ABDOMEN WITHOUT AND WITH CONTRAST TECHNIQUE: Multidetector CT imaging of the abdomen was performed following the standard protocol before and following the bolus administration of intravenous contrast. CONTRAST:  15mL OMNIPAQUE IOHEXOL 300 MG/ML  SOLN COMPARISON:  MRI 10/22/2019. FINDINGS: Lower chest: Unremarkable. Hepatobiliary: Central right hepatic lesion seen on recent MRI is similar measuring approximately 4.2 x 2.8 cm today compared to 4.2 x 3.2 cm on recent MRI. Differential measurement likely related to better contrast resolution on previous MRI. As before, lesion occludes the right portal vein and projects into the portal vein lumen at the left and right portal vein bifurcation. Rim enhancing lesion in the dome of liver measures 3.0 x 3.0 cm today compared to 3.1 x 2.3 cm on  recent MRI. Marked intrahepatic biliary duct dilatation again noted with enlargement of the lateral segment left liver and right hepatic atrophy. Middle and right hepatic veins are not well opacified and patency cannot be confirmed on this study. Gallbladder is nondistended with prominent varices in the gallbladder fossa. Pancreas: No focal mass lesion. No dilatation of the main duct. No intraparenchymal cyst. No peripancreatic edema. Spleen: 11.7 cm craniocaudal  length.  No focal abnormality. Adrenals/Urinary Tract: No adrenal nodule or mass. Kidneys unremarkable. Stomach/Bowel: Stomach is unremarkable. No gastric wall thickening. No evidence of outlet obstruction. Duodenum is normally positioned as is the ligament of Treitz. No small bowel or colonic dilatation within the visualized abdomen. Vascular/Lymphatic: There is abdominal aortic atherosclerosis without aneurysm. Mild lymphadenopathy identified in the hepatoduodenal ligament with 12 mm short axis hepatoduodenal ligament lymph node identified on 34/5. 14 mm short axis retrocaval lymph node is visible on 44/5. Other: No substantial intraperitoneal free fluid. Musculoskeletal: No worrisome lytic or sclerotic osseous abnormality. IMPRESSION: 1. Central right hepatic lesion is similar in size in the interval since recent MRI 2 days ago. A second rim enhancing lesion in the dome of liver is also similar. 2. Stable appearance of the occlusion of the right portal vein and extension of tumor/thrombus into the portal vein lumen at the left and right portal vein bifurcation. 3. Marked intrahepatic biliary duct dilatation with enlargement of the lateral segment left liver and right hepatic atrophy. 4. Right and middle hepatic veins are not well opacified and patency cannot be confirmed. 5. Mild lymphadenopathy in the hepatoduodenal ligament and retrocaval space. Findings suspicious for metastatic disease. 6. Splenomegaly. Electronically Signed   By: Kennith Center M.D.    On: 10/26/2019 10:22   MR ABDOMEN MRCP W WO CONTAST  Result Date: 10/22/2019 CLINICAL DATA:  Hepatitis C, multifocal HCC, worsening liver enzymes and elevated bilirubin, chemotherapy EXAM: MRI ABDOMEN WITHOUT AND WITH CONTRAST (INCLUDING MRCP) TECHNIQUE: Multiplanar multisequence MR imaging of the abdomen was performed both before and after the administration of intravenous contrast. Heavily T2-weighted images of the biliary and pancreatic ducts were obtained, and three-dimensional MRCP images were rendered by post processing. CONTRAST:  46mL GADAVIST GADOBUTROL 1 MMOL/ML IV SOLN COMPARISON:  MR abdomen, 10/07/2019, 08/26/2019, 06/15/2019 FINDINGS: Lower chest: No acute findings. Hepatobiliary: No significant interval change in a large, branching, hypoenhancing mass of the right lobe of the liver, with extensive associated atrophy of the right lobe. The dominant component in the central right lobe of the liver extends into and occludes the central right portal vein. The central bulk of the mass measures approximately 4.2 x 3.2 cm, not significantly changed in size and appearance compared to prior examination when measured similarly (series 20, image 25). Unchanged additional component in the dome of the liver measuring 3.2 x 2.3 cm (series 20, image 10). These demonstrate subtle arterial rim hyperenhancement (series 18, image 26, 10) and there are again multiple small foci of transient arterial hyperenhancement of the right lobe (e.g. Series 18, image 29). Compensatory hypertrophy of the left lobe of the liver. Degree of biliary ductal dilatation in the right lobe appears slightly increased in comparison to prior examination and as noted previously, is gradually increased in comparison multiple more remote prior examinations. Mild biliary ductal dilatation in the left lobe is not significantly changed in comparison to most recent prior examination dated 10/07/2019 but clearly new when compared to more remote  prior examinations. MRCP is severely degraded by motion artifact. Pancreas: No mass, inflammatory changes, or other parenchymal abnormality identified. Spleen:  Within normal limits in size and appearance. Adrenals/Urinary Tract: No masses identified. No evidence of hydronephrosis. Stomach/Bowel: Visualized portions within the abdomen are unremarkable. Vascular/Lymphatic: No pathologically enlarged lymph nodes identified. No abdominal aortic aneurysm demonstrated. Other:  None. Musculoskeletal: No suspicious bone lesions identified. IMPRESSION: 1. No significant interval change in a large, branching, rim hyperenhancing mass of the right lobe of the  liver, with extensive associated atrophy of the right lobe of the liver. The dominant component in the central right lobe of the liver extends into and occludes the central right portal vein, not significantly changed in size and appearance compared to prior examination when measured similarly. Unchanged additional component in the dome of the liver. 2. Mild biliary ductal dilatation in the right lobe appears slightly increased in comparison to prior examination dated 10/07/2019 and as noted previously, gradually increased in comparison to multiple more remote prior examinations. Mild biliary ductal dilatation in the left lobe is not significantly changed in comparison to most recent prior examination dated 10/07/2019 but clearly new when compared to more remote prior examinations. Electronically Signed   By: Lauralyn Primes M.D.   On: 10/22/2019 19:21   IR BILIARY DRAIN PLACEMENT WITH CHOLANGIOGRAM  Result Date: 10/27/2019 INDICATION: History of multifocal hepatocellular carcinoma, now with obstructive jaundice and worsening of LFTs due to progression of central hepatic lesion, now with left-sided intrahepatic biliary duct dilatation (note, patient has pre-existing marked atrophy of the right lobe of the liver secondary to predominantly right-sided multifocal  hepatocellular carcinoma). Patient has been evaluated by the gastroenterology service and deemed not a candidate for ERCP given extent of his disease. As such, request made for image guided placement of a internal/external biliary drainage catheter. EXAM: ULTRASOUND AND FLUOROSCOPIC GUIDED PERCUTANEOUS TRANSHEPATIC CHOLANGIOGRAM AND BILIARY TUBE PLACEMENT COMPARISON:  CT abdomen pelvis-10/26/2019; MRCP-10/22/2019 MEDICATIONS: Rocephin 1 gm IV; The antibiotic was administered with an appropriate time frame prior to the initiation of the procedure CONTRAST:  48mL VISIPAQUE IODIXANOL 320 MG/ML IV SOLN - administered into the biliary tree. ANESTHESIA/SEDATION: Moderate (conscious) sedation was employed during this procedure. A total of Versed 2 mg and Fentanyl 100 mcg was administered intravenously. Moderate Sedation Time: 55 minutes. The patient's level of consciousness and vital signs were monitored continuously by radiology nursing throughout the procedure under my direct supervision. FLUOROSCOPY TIME:  5 minutes, 18 seconds (170 mGy) COMPLICATIONS: None immediate. TECHNIQUE: Informed written consent was obtained from the patient after a discussion of the risks, benefits and alternatives to treatment. Questions regarding the procedure were encouraged and answered. A timeout was performed prior to the initiation of the procedure. The midline of the upper abdomen was prepped and draped in the usual sterile fashion, and a sterile drape was applied covering the operative field. Maximum barrier sterile technique with sterile gowns and gloves were used for the procedure. A timeout was performed prior to the initiation of the procedure. Sonographic evaluation demonstrates primarily centralized dilatation of the left-sided intrahepatic biliary system with very minimal dilatation of the more peripherally located bile ducts. Several attempts were made to cannulate a peripheral bile duct after the overlying soft tissues were  anesthetized 1% lidocaine with epinephrine, and while challenging, ultimately a very mildly dilated peripheral left-sided intrahepatic biliary duct was successfully cannulated. Appropriate positioning was confirmed with limited contrast injection. Next, the duct was cannulated with a Nitrex wire and dilated with an Accustick set under fluoroscopic guidance. Limited cholangiograms were performed in various obliquities confirming appropriate access. Next, a 4 French angled glide catheter was advanced through the outer sheath of the Accustick set and with the use of a stiff Glidewire, advanced through the biliary hilum, common bile duct and ampulla to the level of the duodenum. Contrast injection confirmed appropriate positioning. Under intermittent fluoroscopic guidance and over an Amplatz wire, the track was dilated ultimately allowing placement of a 10 [place] biliary drainage catheter with coil ultimately  locked within the duodenum. Contrast was injected and a completion radiographs were obtained in various obliquities. The catheter was connected to a drainage bag which yielded the brisk return of clear bile. The catheter was secured to the skin with an interrupted suture and StatLock device. Dressings were applied. The patient tolerated the procedure well without immediate postprocedural complication. FINDINGS: Sonographic evaluation of the liver demonstrates moderate dilatation of the central aspect of the left intrahepatic biliary system with very minimal dilatation of the peripheral aspect of the intrahepatic biliary ductal dilatation While challenging, ultimately, a mildly dilated peripheral left-sided bile duct was successfully cannulated. Contrast injection demonstrates malignant obstruction of the central aspect of the left intrahepatic biliary tree at the level of the hilum as was demonstrated on preceding abdominal CT. Ultimately, with the use of a regular glidewire, a 4 [place] glide catheter was advanced  beyond the malignant obstruction to the level of the duodenum. Following biliary drainage catheter placement, the end of the biliary drainage catheter is appropriately positioned with end coiled and locked within the duodenum while the radiopaque side marker is positioned at the level of the central aspect the intrahepatic biliary tree. Post completion radiographs demonstrate malignant narrowing/occlusion of the proper hepatic duct extending from the level of the biliary hilum to the confluence of the cystic duct. The cystic duct remains patent with opacification of gallbladder lumen. The CBD appears normal and widely patent to the level of the duodenum. There is no significant opacification of the right intrahepatic biliary system. IMPRESSION: 1. Successful placement of a 10.2 French percutaneous biliary drainage catheter with end coiled and locked within the duodenum. 2. Malignant narrowing/occlusion of the proper hepatic duct extending from the biliary hilum to the confluence of the cystic duct. PLAN: - Maintain the biliary drainage catheter to external drainage. - Flush the biliary drainage catheter with 10 cc of saline b.i.d. - Maintain diligent records regarding daily drainage catheter output, realizing that some bile may drain internally as well as externally. - Obtain daily CMPS until the patient's LFTs have reached a nadir. - Once the patient's LFTs have reached a nadir, the patient may return for definitive cholangiogram and consideration of biliary drainage catheter capping as indicated. Note, it is unlikely the patient will ever be a stent candidate given the location of the malignant narrowing at the level hilum. Electronically Signed   By: Sandi Mariscal M.D.   On: 10/27/2019 16:15   US ABDOMEN LIMITED RUQ  Result Date: 10/22/2019 CLINICAL DATA:  Elevated liver enzymes EXAM: ULTRASOUND ABDOMEN LIMITED RIGHT UPPER QUADRANT COMPARISON:  Abdominal MR October 07 2019 FINDINGS: Gallbladder: No gallstones or  wall thickening visualized. No evident pericholecystic fluid. No sonographic Murphy sign noted by sonographer. Common bile duct: Diameter: 2 mm. No extrahepatic biliary duct dilatation is evident. Central biliary duct prominence in the periportal region appear similar to MR. Liver: No focal lesion identified. Within normal limits in parenchymal echogenicity. Thrombus is seen within the portal vein, incompletely obstructing. Flow in the portal vein is in the anatomic direction. Other: None. IMPRESSION: 1. 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 Flow in the portal vein is in the anatomic direction. 2. The apparent periportal mass seen on recent MR is not distinguishable by ultrasound. Note that there is central intrahepatic biliary duct dilatation in this region. There is no appreciable extrahepatic biliary duct dilatation. 3.  No gallbladder pathology demonstrable on this study. Electronically Signed   By: Lowella Grip  III M.D.   On: 10/22/2019 10:27    PERFORMANCE STATUS (ECOG) : 3 - Symptomatic, >50% confined to bed  Review of Systems Unless otherwise noted, a complete review of systems is negative.  Physical Exam General: NAD, thin, frail-appearing Pulmonary: Unlabored Extremities: no edema, no joint deformities Skin: no rashes Neurological: Weakness but otherwise nonfocal  IMPRESSION: I met with patient, wife, and daughter.  Together, we reviewed patient's current medical problems.  Patient and family recognize that he has not felt to have any viable treatment options left for his [place].  Hospice has been recommended.  Today, we discussed hospice in detail.  All questions were answered.  With patient and wife are in agreement with hospice care in the home.  Patient says that he would want his end-of-life to be at home if possible, although we did discuss the possible future option of a residential hospice facility if needed.  Will consult care  management and the hospice liaison to coordinate discharge.  We did discuss CODE STATUS.  Both patient and wife stated that they would not want him to be resuscitated nor have his life prolonged artificially on machines.  They were in agreement with DNR/DNI.  I signed a DNR order in place and in the chart.  Symptomatically, patient has some abdominal pain for which he has been receiving as needed hydromorphone.  We will plan to continue as needed oral hydromorphone at time of discharge.  PLAN: -Best supportive care -Referral to hospice -DNR/DNI  Case and plan discussed with Drs. Mike Gip and Manuella Ghazi  Time Total: 60 minutes  Visit consisted of counseling and education dealing with the complex and emotionally intense issues of symptom management and palliative care in the setting of serious and potentially life-threatening illness.Greater than 50%  of this time was spent counseling and coordinating care related to the above assessment and plan.  Signed by: Altha Harm, PhD, NP-C

## 2019-10-28 NOTE — Plan of Care (Signed)
  Problem: Education: Goal: Knowledge of General Education information will improve Description: Including pain rating scale, medication(s)/side effects and non-pharmacologic comfort measures Outcome: Progressing   Problem: Health Behavior/Discharge Planning: Goal: Ability to manage health-related needs will improve Outcome: Progressing   Problem: Clinical Measurements: Goal: Ability to maintain clinical measurements within normal limits will improve Outcome: Progressing Goal: Will remain free from infection Outcome: Progressing Goal: Diagnostic test results will improve Outcome: Progressing Goal: Respiratory complications will improve Outcome: Progressing Goal: Cardiovascular complication will be avoided Outcome: Progressing   Problem: Activity: Goal: Risk for activity intolerance will decrease Outcome: Progressing   Problem: Nutrition: Goal: Adequate nutrition will be maintained Outcome: Progressing   Problem: Coping: Goal: Level of anxiety will decrease Outcome: Progressing   Problem: Elimination: Goal: Will not experience complications related to bowel motility Outcome: Progressing Goal: Will not experience complications related to urinary retention Outcome: Progressing   Problem: Pain Managment: Goal: General experience of comfort will improve Outcome: Progressing   Problem: Safety: Goal: Ability to remain free from injury will improve Outcome: Progressing   Problem: Skin Integrity: Goal: Risk for impaired skin integrity will decrease Outcome: Progressing   Problem: Education: Goal: Knowledge of the prescribed therapeutic regimen will improve Outcome: Progressing   Problem: Activity: Goal: Ability to implement measures to reduce episodes of fatigue will improve Outcome: Progressing   Problem: Coping: Goal: Ability to identify and develop effective coping behavior will improve Outcome: Progressing   Problem: Nutritional: Goal: Maintenance of  adequate nutrition will improve Outcome: Progressing

## 2019-10-29 LAB — T3, FREE: T3, Free: 1.8 pg/mL — ABNORMAL LOW (ref 2.0–4.4)

## 2019-10-30 NOTE — Discharge Summary (Signed)
[facility] NAME: Chris Mcdonald    MR#:  [id number]  DATE OF BIRTH:  1956/01/23  DATE OF ADMISSION:  10/22/2019   ADMITTING PHYSICIAN: Fritzi Mandes, MD  DATE OF DISCHARGE: 10/28/2019  1:53 PM  PRIMARY CARE PHYSICIAN: Donnie Coffin, MD   ADMISSION DIAGNOSIS:  Jaundice [R17] DISCHARGE DIAGNOSIS:  Principal Problem:   Obstructive jaundice due to malignant neoplasm [facility]) Active Problems:   Hepatocellular carcinoma (HCC)   Hyponatremia   Hypokalemia   Essential hypertension   Gastroesophageal reflux disease without esophagitis   Elevated LFTs   Moderate protein-calorie malnutrition (HCC)   Hyperbilirubinemia   Normocytic anemia   Hospice care patient   Palliative care encounter  SECONDARY DIAGNOSIS:   Past Medical History:  Diagnosis Date  . Arthritis   . Hepatitis   . Hypertension   . Liver cancer ([place])   . Spinal stenosis   . Stroke [facility])    HOSPITAL COURSE:  64 year old male with a known history of multifocal stage IIIb hepatocellular carcinoma on chemotherapy followed by Dr. Mike Gip at the cancer center admitted for worsening LFTs and jaundice.  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 - Underwent successful left-sided 79 [place] biliary drainage catheter connected to gravity bag by interventional radiology on 7/7 -After discussion with GI, radiology, oncology -patient was felt not to be a candidate for surgery, stenting, any kind of systemic treatment. - Palliative care was consulted and after discussion with patient and family decision was made to keep him comfortable only  - he is  being discharged home with hospice  Hypokalemia repleted and resolved  Hyponatremia improved with hydration  Hepato-cellular carcinoma stage IIIb  Moderate protein calorie malnutrition Essential hypertension GERD Peripheral neuropathy Normocytic anemiatial hypertension Hepatocellular carcinoma stage IIIb Hyponatremia   DISCHARGE CONDITIONS:  Fair CONSULTS OBTAINED:   DRUG ALLERGIES:  No Known Allergies DISCHARGE MEDICATIONS:   Allergies as of 10/28/2019   No Known Allergies     Medication List    STOP taking these medications   cabozantinib 20 MG tablet Commonly known as: CABOMETYX   hydrochlorothiazide 12.5 MG tablet Commonly known as: HYDRODIURIL   potassium chloride SA 20 MEQ tablet Commonly known as: Klor-Con M20     TAKE these medications   aspirin EC 81 MG tablet Take 81 mg by mouth daily.   gabapentin 100 MG capsule Commonly known as: NEURONTIN Take 1 capsule (100 mg total) by mouth 2 (two) times daily.   hydrOXYzine 25 MG tablet Commonly known as: ATARAX/VISTARIL Take 1 tablet (25 mg total) by mouth 3 (three) times daily as needed for itching.   LORazepam 0.5 MG tablet Commonly known as: Ativan Take 1 tablet (0.5 mg total) by mouth every 8 (eight) hours as needed for up to 10 days for anxiety.   methocarbamol 500 MG tablet Commonly known as: ROBAXIN Take 2 tablets (1,000 mg total) by mouth every 6 (six) hours as needed for muscle spasms.   morphine CONCENTRATE 10 mg / 0.5 ml concentrated solution Take 0.25 mLs (5 mg total) by mouth every 2 (two) hours as needed for severe pain.   ondansetron 8 MG  tablet Commonly known as: Zofran Take 1 tablet (8 mg total) by mouth every 8 (eight) hours as needed for nausea or vomiting.   traMADol 50 MG tablet Commonly known as: ULTRAM Take 1 tablet (50 mg total) by mouth every 8 (eight) hours as needed for moderate pain.      DISCHARGE INSTRUCTIONS:   DIET:  Regular diet DISCHARGE CONDITION:   Fair ACTIVITY:  Activity as tolerated OXYGEN:  Home Oxygen: No.  Oxygen Delivery: room air DISCHARGE LOCATION:  home with hospice  If you experience worsening of your admission symptoms, develop shortness of breath, life threatening emergency, suicidal or homicidal thoughts you must seek medical attention immediately by calling 911 or calling your MD immediately  if symptoms less severe.  You Must read complete instructions/literature along with all the possible adverse reactions/side effects for all the Medicines you take and that have been prescribed to you. Take any new Medicines after you have completely understood and accpet all the possible adverse reactions/side effects.   Please note  You were cared for by a hospitalist during your hospital stay. If you have any questions about your discharge medications or the care you received while you were in the hospital after you are discharged, you can call the unit and asked to speak with the hospitalist on call if the hospitalist that took care of you is not available. Once you are discharged, your primary care physician will handle any further medical issues. Please note that NO REFILLS for any discharge medications will be authorized once you are discharged, as it is imperative that you return to your primary care physician (or establish a relationship with a primary care physician if you do not have one) for your aftercare needs so that they can reassess your need for medications and monitor your lab values.    On the day of Discharge:  VITAL SIGNS:  Blood pressure 113/75, pulse 76, temperature 98.2 F (36.8 C), temperature source Oral, resp. rate 17, height 5\' 7"  (1.702 m), weight 49.5 kg, SpO2 99 %. PHYSICAL EXAMINATION:  GENERAL:  65 y.o.-year-old patient lying in the bed with no acute distress.  EYES: Pupils equal, round, reactive to light and accommodation. No scleral icterus. Extraocular muscles intact.  HEENT: Head atraumatic,  normocephalic. Oropharynx and nasopharynx clear.  NECK:  Supple, no jugular venous distention. No thyroid enlargement, no tenderness.  LUNGS: Normal breath sounds bilaterally, no wheezing, rales,rhonchi or crepitation. No use of accessory muscles of respiration.  CARDIOVASCULAR: S1, S2 normal. No murmurs, rubs, or gallops.  ABDOMEN: Soft, non-tender, non-distended. Bowel sounds present. No organomegaly or mass.  Left-sided biliary drainage in place connected to gravity bag EXTREMITIES: No pedal edema, cyanosis, or clubbing.  NEUROLOGIC: Cranial nerves II through XII are intact. Muscle strength 5/5 in all extremities. Sensation intact. Gait not checked.  PSYCHIATRIC: The patient is alert and oriented x 3.  SKIN: No obvious rash, lesion, or ulcer.  DATA REVIEW:   CBC Recent Labs  Lab 10/28/19 0438  WBC 8.8  HGB 10.4*  HCT 30.0*  PLT 213    Chemistries  Recent Labs  Lab 10/27/19 0424 10/27/19 0424 10/28/19 0438  NA 131*   < > 130*  K 3.9   < > 3.6  CL 101   < > 98  CO2 24   < > 22  GLUCOSE 93   < > 115*  BUN 12   < > 13  CREATININE 0.46*   < > 0.55*  CALCIUM 8.8*   < >  9.2  MG 2.0  --   --   AST 222*   < > 251*  ALT 183*   < > 218*  ALKPHOS 781*   < > 804*  BILITOT 15.8*   < > 14.4*   < > = values in this interval not displayed.     Outpatient follow-up  Follow-up Information    Aycock, Ngwe A, MD. Schedule an appointment as soon as possible for a visit in 1 week(s).   Specialty: Family Medicine Contact information: [place] [address] [phone]        Lequita Asal, MD. Schedule an appointment as soon as possible for a visit in 1 week(s).   Specialty: Hematology and Oncology Contact information: [place] [address] [phone]                Management plans discussed with the patient, family and they are in agreement.  CODE STATUS: Prior   TOTAL TIME TAKING CARE OF THIS PATIENT: 35  minutes.    Max Sane M.D on 10/30/2019 at 4:23 PM  [facility]   CC: Primary care physician; Donnie Coffin, MD   Note: This dictation was prepared with Dragon dictation along with smaller phrase technology. Any transcriptional errors that result from this process are unintentional.

## 2019-11-16 ENCOUNTER — Other Ambulatory Visit: Payer: Self-pay | Admitting: Hematology and Oncology

## 2019-11-16 DIAGNOSIS — E876 Hypokalemia: Secondary | ICD-10-CM

## 2019-11-18 ENCOUNTER — Other Ambulatory Visit: Payer: Self-pay | Admitting: Hospice and Palliative Medicine

## 2019-11-18 MED ORDER — MORPHINE SULFATE (CONCENTRATE) 10 MG /0.5 ML PO SOLN: 5.0000 mg | ORAL | 0 refills | Status: AC | PRN

## 2019-11-18 NOTE — Progress Notes (Signed)
I spoke with patient's hospice nurse, Sherlyn Hay. She said patient is rapidly declining and family are looking into transfer to the [facility] for end of life care. Patient is symptomatic with pain/dyspnea. She requested refill of the morphine elixir.

## 2019-12-02 ENCOUNTER — Ambulatory Visit: Payer: BC Managed Care – PPO | Admitting: Gastroenterology

## 2019-12-22 DEATH — deceased

## 2020-12-29 IMAGING — MR MR ABDOMEN WO/W CM
14 of 17 series · 39 of 48 positions shown · IV contrast (gadavist)
Comparison: Multiple prior studies most recent August 26, 2019
COMPARISON: Multiple prior studies most recent August 26, 2019

Addendum:
CLINICAL DATA: Elevated LFTs, follow-up hepatocellular carcinoma.

EXAM:
MRI ABDOMEN WITHOUT AND WITH CONTRAST
TECHNIQUE: Multiplanar multisequence MR imaging of the abdomen was performed
both before and after the administration of intravenous contrast.
CONTRAST:  5mL GADAVIST GADOBUTROL 1 MMOL/ML IV SOLN

[Series 3: T2 · coronal · 6.0mm · 1.19mm/px · 2 of 30 slices shown (1 of 2)]
[im 1/30]
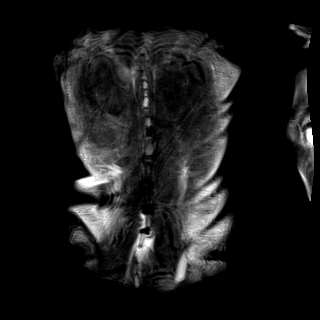
[im 30/30]
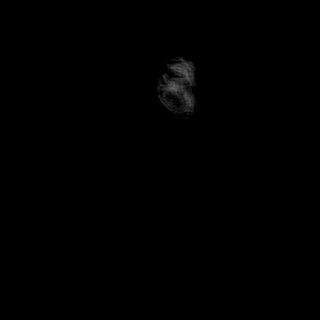

[Series 4: T2 · axial · 6.0mm · 1.19mm/px · z∈[-153,+70]mm · 2 of 32 slices shown (2 of 2)]
[im 1/32]
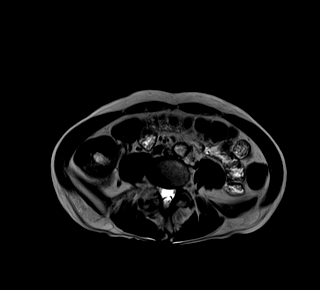
[im 32/32]
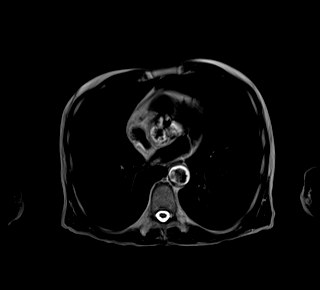

[Series 6: T2 fat-sat · axial · 6.0mm · 1.19mm/px · z∈[-161,+77]mm · 2 of 34 slices shown]
[im 1/34]
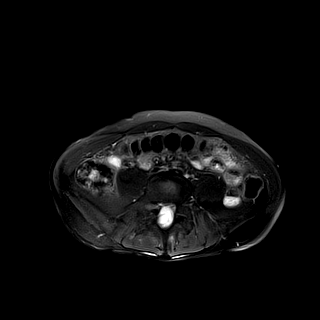
[im 34/34]
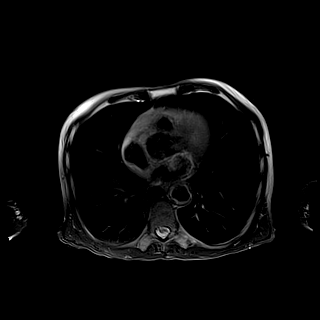

[Series 7: ax dwi_tracew · axial · 6.0mm · 1.42mm/px · z∈[-161,+77]mm · 5 of 102 slices shown]
[im 1/102]
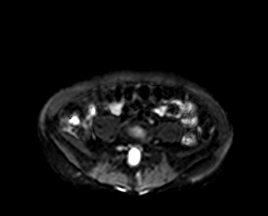
[im 26/102]
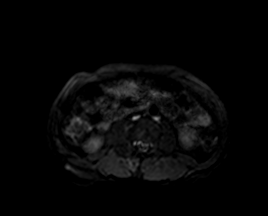
[im 51/102]
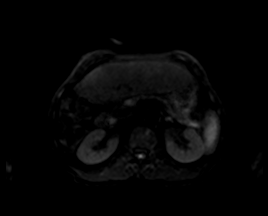
[im 76/102]
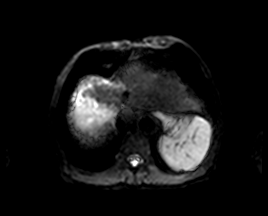
[im 102/102]
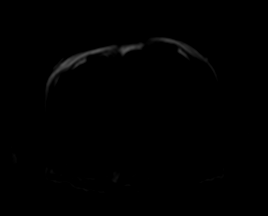

[Series 8: ax dwi_adc · axial · 6.0mm · 1.42mm/px · z∈[-161,+77]mm · 2 of 34 slices shown]
[im 1/34]
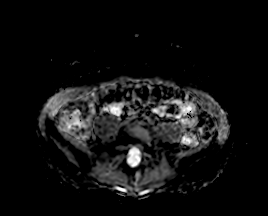
[im 34/34]
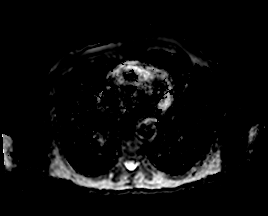

[Series 9: T1 · axial · 6.0mm · 0.74mm/px · z∈[-153,+70]mm · 4 of 64 slices shown]
[im 1/64]
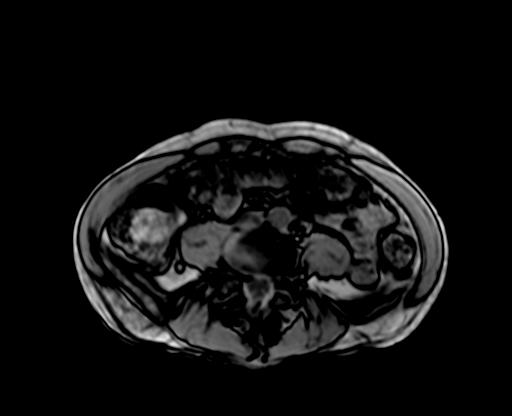
[im 22/64]
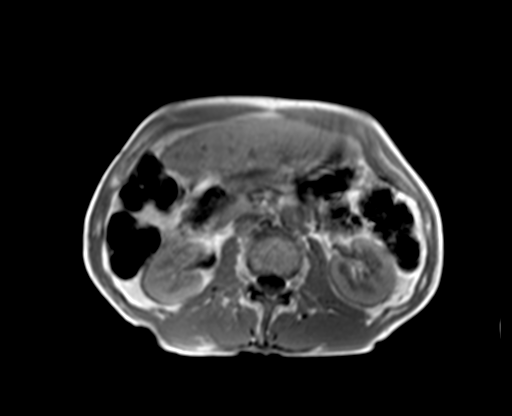
[im 43/64]
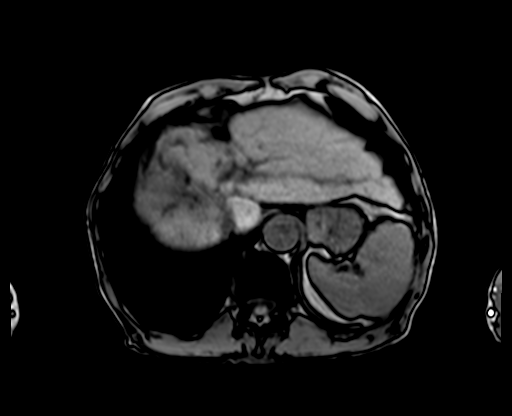
[im 64/64]
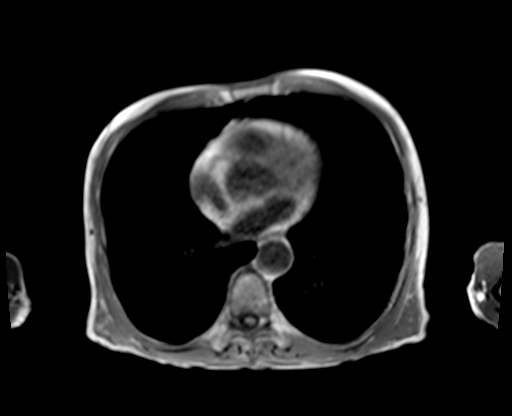

[Series 10: bSSFP · axial · 6.0mm · 0.74mm/px · 1 of 32 slices shown]
[im 1/32]
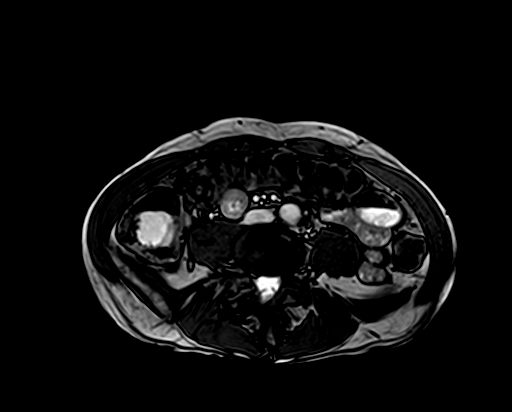

[Series 11: T1 dynamic fat-sat · axial · non-contrast · 3.0mm · 1.19mm/px · z∈[-160,+77]mm · 3 of 80 slices shown (1 of 4)]
[im 1/80]
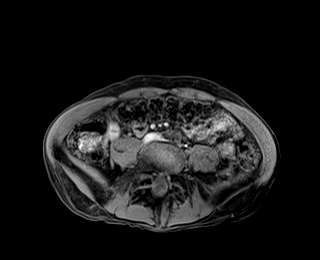
[im 40/80]
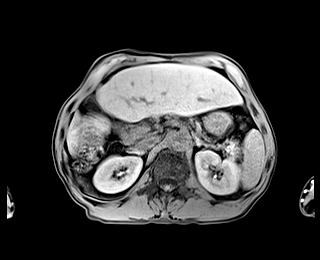
[im 80/80]
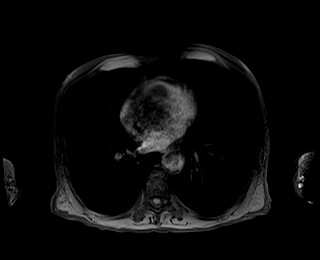

[Series 12: T1 dynamic fat-sat post-contrast · axial · 3.0mm · 1.19mm/px · z∈[-160,+77]mm · 3 of 80 slices shown (1 of 3)]
[im 1/80]
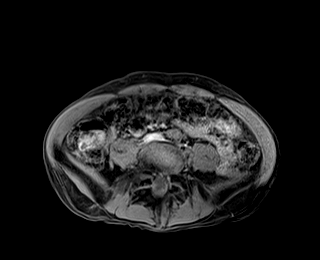
[im 40/80]
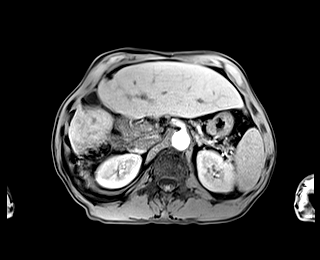
[im 80/80]
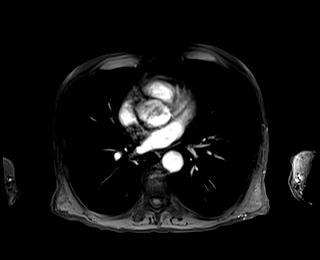

[Series 13: T1 dynamic fat-sat · axial · 3.0mm · 1.19mm/px · z∈[-160,+77]mm · 3 of 80 slices shown (2 of 4)]
[im 1/80]
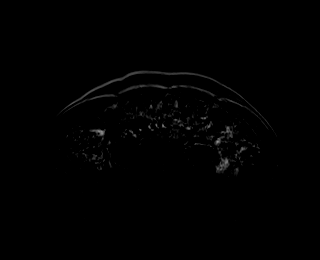
[im 40/80]
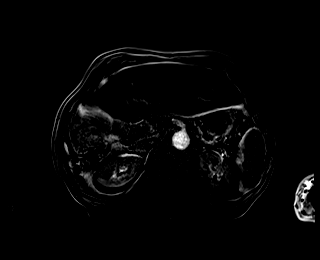
[im 80/80]
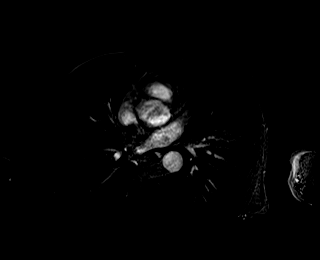

[Series 14: T1 dynamic fat-sat post-contrast · axial · 3.0mm · 1.19mm/px · z∈[-160,+77]mm · 3 of 80 slices shown (2 of 3)]
[im 1/80]
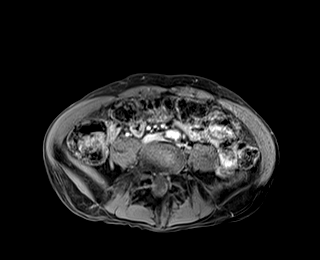
[im 40/80]
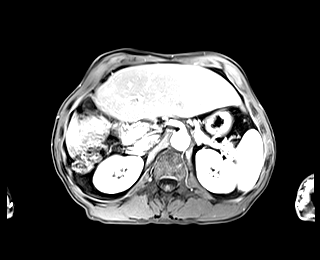
[im 80/80]
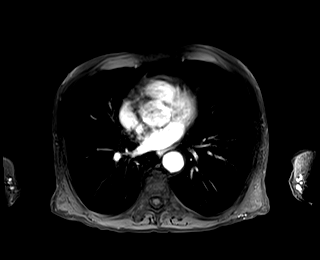

[Series 15: T1 dynamic fat-sat · axial · 3.0mm · 1.19mm/px · z∈[-160,+77]mm · 3 of 80 slices shown (3 of 4)]
[im 1/80]
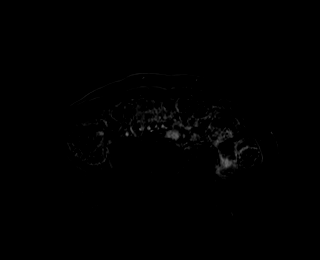
[im 40/80]
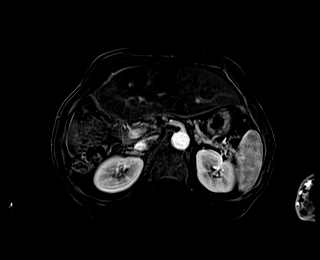
[im 80/80]
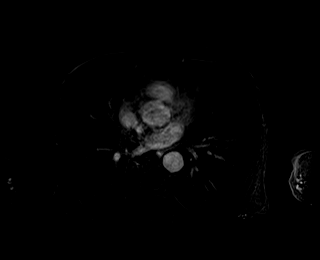

[Series 16: T1 dynamic fat-sat post-contrast · axial · 3.0mm · 1.19mm/px · z∈[-160,+77]mm · 3 of 80 slices shown (3 of 3)]
[im 1/80]
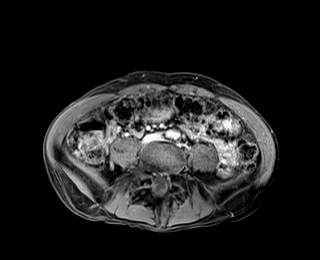
[im 40/80]
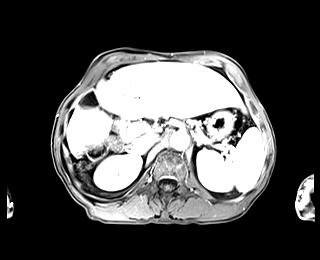
[im 80/80]
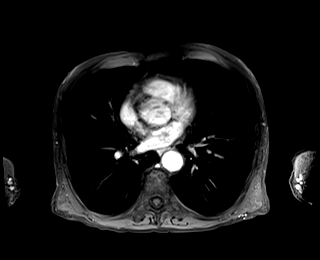

[Series 17: T1 dynamic fat-sat · axial · 3.0mm · 1.19mm/px · z∈[-160,+77]mm · 3 of 80 slices shown (4 of 4)]
[im 1/80]
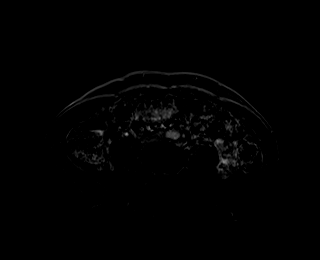
[im 40/80]
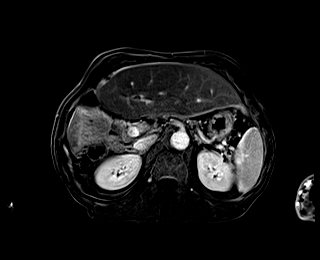
[im 80/80]
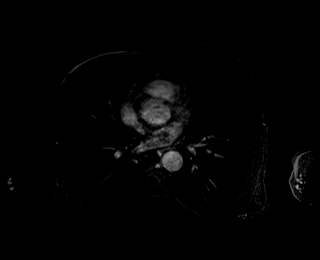

[39 of 48 positions shown; findings below may reference images not displayed]

FINDINGS: Lower chest: Lobular contour lung bases without gross consolidation
or effusion. No pericardial fluid.

Hepatobiliary: Extensive intrahepatic biliary ductal dilation and
central masslike area in the hepatic hilum biased towards RIGHT
hepatic lobe is unchanged compared to recent imaging. Some mild
ductal distension of LEFT biliary radicles, perhaps slightly
increased compared to the prior study best seen on single shot T2
weighted images (image 11, series 4) predominant ductal distension
remains present in the RIGHT hepatic lobe and to a lesser extent the
caudate. Study not performed as an MRCP.

Diffuse diffusion abnormality throughout the RIGHT hepatic lobe also
shows a similar appearance.

Expansile portal venous thrombus which accounts for much of the mass
like area extends into the main portal vein and central portion of
the LEFT portal vein without occluding the structures, this finding
is unchanged.

Central hypoenhancing area showing minimal enhancement on the
subtraction data [DATE] x 3.0 cm in the axial plane on
image 35 of series 16, approximately 5.1 x 3.0 cm previously

Pancreas: No mass, inflammatory changes, or other parenchymal
abnormality identified.

Spleen:  Spleen normal size and contour without splenic lesion.

Adrenals/Urinary Tract: Adrenal glands are normal. Symmetric renal
enhancement. No hydronephrosis.

Stomach/Bowel: Abundant stool in the colon. Limited assessment of
gastrointestinal structures without acute process.

Vascular/Lymphatic: Calcified and noncalcified atheromatous plaque
of the abdominal aorta. No sign of aneurysm. No adenopathy. No
adenopathy

Other:  No ascites.

Musculoskeletal: No suspicious bone lesions identified.
IMPRESSION: 1. Question minimal increase in distension of LEFT intrahepatic and
LEFT hepatic biliary ducts. This is minimal and there is overall
very little interval change in the appearance of the mass, portal
vein invasion and RIGHT intrahepatic biliary ductal dilation.
2. Expansile portal venous thrombus which accounts for much of the
mass like area extends into the main portal vein and central portion
of the LEFT portal vein without occluding the structures, this
finding is unchanged.
3. No abdominal adenopathy.
4. Calcified and noncalcified atheromatous plaque of the abdominal
aorta.

ADDENDUM:
Over a series of prior studies there is definite increase in biliary
ductal dilation. There is LEFT hepatic ductal distension that was
not present and there is increasing intrahepatic biliary ductal
dilation to the RIGHT hepatic lobe progressing from [facility] Patrick until
the current study.

These results were called by telephone at the time of interpretation
on 10/14/2019 at [DATE] to provider MASATOYO DODO , who verbally
acknowledged these results.

*** End of Addendum ***
FINDINGS: Lower chest: Lobular contour lung bases without gross consolidation
or effusion. No pericardial fluid.

Hepatobiliary: Extensive intrahepatic biliary ductal dilation and
central masslike area in the hepatic hilum biased towards RIGHT
hepatic lobe is unchanged compared to recent imaging. Some mild
ductal distension of LEFT biliary radicles, perhaps slightly
increased compared to the prior study best seen on single shot T2
weighted images (image 11, series 4) predominant ductal distension
remains present in the RIGHT hepatic lobe and to a lesser extent the
caudate. Study not performed as an MRCP.

Diffuse diffusion abnormality throughout the RIGHT hepatic lobe also
shows a similar appearance.

Expansile portal venous thrombus which accounts for much of the mass
like area extends into the main portal vein and central portion of
the LEFT portal vein without occluding the structures, this finding
is unchanged.

Central hypoenhancing area showing minimal enhancement on the
subtraction data [DATE] x 3.0 cm in the axial plane on
image 35 of series 16, approximately 5.1 x 3.0 cm previously

Pancreas: No mass, inflammatory changes, or other parenchymal
abnormality identified.

Spleen:  Spleen normal size and contour without splenic lesion.

Adrenals/Urinary Tract: Adrenal glands are normal. Symmetric renal
enhancement. No hydronephrosis.

Stomach/Bowel: Abundant stool in the colon. Limited assessment of
gastrointestinal structures without acute process.

Vascular/Lymphatic: Calcified and noncalcified atheromatous plaque
of the abdominal aorta. No sign of aneurysm. No adenopathy. No
adenopathy

Other:  No ascites.

Musculoskeletal: No suspicious bone lesions identified.
IMPRESSION: 1. Question minimal increase in distension of LEFT intrahepatic and
LEFT hepatic biliary ducts. This is minimal and there is overall
very little interval change in the appearance of the mass, portal
vein invasion and RIGHT intrahepatic biliary ductal dilation.
2. Expansile portal venous thrombus which accounts for much of the
mass like area extends into the main portal vein and central portion
of the LEFT portal vein without occluding the structures, this
finding is unchanged.
3. No abdominal adenopathy.
4. Calcified and noncalcified atheromatous plaque of the abdominal
aorta.
